# Patient Record
Sex: Female | Born: 1950 | Race: White | Hispanic: No | Marital: Married | Smoking: Former smoker
Health system: Southern US, Community
[De-identification: ages and names within clinical notes are randomized; demographics above are authoritative.]

## PROBLEM LIST (undated history)

## (undated) VITALS — BP 110/69 | HR 102 | Temp 97.0°F | Resp 18 | Ht 63.0 in | Wt 127.0 lb

## (undated) DIAGNOSIS — M47812 Spondylosis without myelopathy or radiculopathy, cervical region: Secondary | ICD-10-CM

## (undated) DIAGNOSIS — F32A Depression, unspecified: Secondary | ICD-10-CM

## (undated) DIAGNOSIS — F419 Anxiety disorder, unspecified: Secondary | ICD-10-CM

## (undated) DIAGNOSIS — G4730 Sleep apnea, unspecified: Secondary | ICD-10-CM

## (undated) DIAGNOSIS — I10 Essential (primary) hypertension: Secondary | ICD-10-CM

## (undated) DIAGNOSIS — F329 Major depressive disorder, single episode, unspecified: Secondary | ICD-10-CM

## (undated) DIAGNOSIS — D219 Benign neoplasm of connective and other soft tissue, unspecified: Secondary | ICD-10-CM

## (undated) DIAGNOSIS — K219 Gastro-esophageal reflux disease without esophagitis: Secondary | ICD-10-CM

## (undated) DIAGNOSIS — K8020 Calculus of gallbladder without cholecystitis without obstruction: Secondary | ICD-10-CM

## (undated) DIAGNOSIS — E7800 Pure hypercholesterolemia, unspecified: Secondary | ICD-10-CM

## (undated) DIAGNOSIS — F99 Mental disorder, not otherwise specified: Secondary | ICD-10-CM

## (undated) HISTORY — DX: Benign neoplasm of connective and other soft tissue, unspecified: D21.9

## (undated) HISTORY — DX: Sleep apnea, unspecified: G47.30

## (undated) HISTORY — PX: CHOLECYSTECTOMY: SHX55

## (undated) HISTORY — DX: Calculus of gallbladder without cholecystitis without obstruction: K80.20

## (undated) HISTORY — DX: Spondylosis without myelopathy or radiculopathy, cervical region: M47.812

## (undated) HISTORY — DX: Gastro-esophageal reflux disease without esophagitis: K21.9

## (undated) HISTORY — DX: Pure hypercholesterolemia, unspecified: E78.00

---

## 1999-01-06 ENCOUNTER — Other Ambulatory Visit: Admission: RE | Admit: 1999-01-06 | Discharge: 1999-01-06 | Payer: Self-pay | Admitting: Family Medicine

## 1999-02-12 ENCOUNTER — Other Ambulatory Visit: Admission: RE | Admit: 1999-02-12 | Discharge: 1999-02-20 | Payer: Self-pay

## 1999-03-10 ENCOUNTER — Inpatient Hospital Stay (HOSPITAL_COMMUNITY): Admission: AD | Admit: 1999-03-10 | Discharge: 1999-03-18 | Payer: Self-pay | Admitting: Psychiatry

## 2000-08-17 ENCOUNTER — Encounter: Payer: Self-pay | Admitting: Family Medicine

## 2000-08-17 ENCOUNTER — Encounter: Admission: RE | Admit: 2000-08-17 | Discharge: 2000-08-17 | Payer: Self-pay | Admitting: Family Medicine

## 2002-01-09 ENCOUNTER — Encounter: Admission: RE | Admit: 2002-01-09 | Discharge: 2002-01-09 | Payer: Self-pay | Admitting: Family Medicine

## 2002-01-09 ENCOUNTER — Encounter: Payer: Self-pay | Admitting: Family Medicine

## 2003-06-07 ENCOUNTER — Other Ambulatory Visit: Admission: RE | Admit: 2003-06-07 | Discharge: 2003-06-07 | Payer: Self-pay | Admitting: Family Medicine

## 2004-09-05 ENCOUNTER — Ambulatory Visit (HOSPITAL_COMMUNITY): Admission: RE | Admit: 2004-09-05 | Discharge: 2004-09-05 | Payer: Self-pay | Admitting: Obstetrics and Gynecology

## 2004-09-05 ENCOUNTER — Encounter (INDEPENDENT_AMBULATORY_CARE_PROVIDER_SITE_OTHER): Payer: Self-pay | Admitting: Specialist

## 2004-10-17 ENCOUNTER — Encounter: Admission: RE | Admit: 2004-10-17 | Discharge: 2004-10-17 | Payer: Self-pay | Admitting: Gastroenterology

## 2005-01-08 ENCOUNTER — Other Ambulatory Visit: Admission: RE | Admit: 2005-01-08 | Discharge: 2005-01-08 | Payer: Self-pay | Admitting: Obstetrics and Gynecology

## 2005-02-03 ENCOUNTER — Ambulatory Visit (HOSPITAL_COMMUNITY): Admission: RE | Admit: 2005-02-03 | Discharge: 2005-02-03 | Payer: Self-pay | Admitting: Obstetrics and Gynecology

## 2005-11-18 ENCOUNTER — Emergency Department (HOSPITAL_COMMUNITY): Admission: EM | Admit: 2005-11-18 | Discharge: 2005-11-18 | Payer: Self-pay | Admitting: Emergency Medicine

## 2006-02-24 ENCOUNTER — Other Ambulatory Visit: Admission: RE | Admit: 2006-02-24 | Discharge: 2006-02-24 | Payer: Self-pay | Admitting: Obstetrics and Gynecology

## 2007-04-28 ENCOUNTER — Other Ambulatory Visit: Admission: RE | Admit: 2007-04-28 | Discharge: 2007-04-28 | Payer: Self-pay | Admitting: Obstetrics and Gynecology

## 2008-03-01 ENCOUNTER — Encounter: Admission: RE | Admit: 2008-03-01 | Discharge: 2008-03-01 | Payer: Self-pay | Admitting: Obstetrics and Gynecology

## 2008-08-20 ENCOUNTER — Encounter: Admission: RE | Admit: 2008-08-20 | Discharge: 2008-08-20 | Payer: Self-pay | Admitting: Obstetrics and Gynecology

## 2008-11-12 ENCOUNTER — Encounter: Admission: RE | Admit: 2008-11-12 | Discharge: 2008-11-12 | Payer: Self-pay | Admitting: Obstetrics and Gynecology

## 2009-12-16 ENCOUNTER — Encounter: Admission: RE | Admit: 2009-12-16 | Discharge: 2009-12-16 | Payer: Self-pay | Admitting: Family Medicine

## 2009-12-25 ENCOUNTER — Other Ambulatory Visit: Admission: RE | Admit: 2009-12-25 | Discharge: 2009-12-25 | Payer: Self-pay | Admitting: Family Medicine

## 2010-06-08 ENCOUNTER — Encounter: Payer: Self-pay | Admitting: Obstetrics and Gynecology

## 2010-10-03 NOTE — H&P (Signed)
NAMEPAULITA, Kayla Deleon NO.:  [id number]   MEDICAL RECORD NO.:  [id number]           PATIENT TYPE:   LOCATION:                                 FACILITY:   PHYSICIAN:  Duke Salvia. Marcelle Overlie, M.D.    DATE OF BIRTH:   DATE OF ADMISSION:  DATE OF DISCHARGE:                                HISTORY & PHYSICAL   CHIEF COMPLAINT:  Menorrhagia with endometrial polyp.   HISTORY OF PRESENT ILLNESS:  A 60 year old G1, P1 perimenopausal patient who  was evaluated last November and December.  At that time her E2 level was 173  with an FSH of 7.1 and she was complaining of heavy periods.  SHG done  April 25, 2004, showed a well defined endometrial polyp, no other  abnormalities.  Adnexa negative.  Presents now for [facility] hysteroscopy.  This  procedure including risk of bleeding, infection, other complications that  may require additional surgery all reviewed with her which she understands  and accepts.   ALLERGIES:  No known drug allergies.   CURRENT MEDICATION:  Wellbutrin 200 mg p.o. daily.   PAST SURGICAL HISTORY:  1.  Cholecystectomy.  2.  Cesarean section x1.   FAMILY HISTORY:  Significant for a father with hypertension and angina.   PHYSICAL EXAMINATION:  VITAL SIGNS:  Temperature 98.2, blood pressure  142/80.  HEENT:  Unremarkable.  NECK:  Supple without masses.  LUNGS:  Clear.  CARDIOVASCULAR:  Regular rate and rhythm without murmurs, rubs, or gallops.  BREASTS:  Without masses.  ABDOMEN:  Soft, flat and nontender.  PELVIC:  Normal external genitalia, vagina and cervix clear.  Uterus mid  position, normal size, adnexa negative.  EXTREMITIES:  Unremarkable.  NEUROLOGIC:  Unremarkable.   IMPRESSION:  Menorrhagia, endometrial polyp.   PLAN:  D&C hysteroscopy.  procedure and risks reviewed as above.    RMH/MEDQ  D:  09/04/2004  T:  09/04/2004  Job:  [id number]

## 2010-10-03 NOTE — Op Note (Signed)
NAMEMODESTY, RUDY NO.:  [id number]   MEDICAL RECORD NO.:  [id number]          PATIENT TYPE:  AMB   LOCATION:  SDC                           FACILITY:  [facility]   PHYSICIAN:  Duke Salvia. Marcelle Overlie, M.D.DATE OF BIRTH:  January 29, 1951   DATE OF PROCEDURE:  09/05/2004  DATE OF DISCHARGE:                                 OPERATIVE REPORT   PREOPERATIVE DIAGNOSES:  1.  Endometrial polyp.  2.  Abnormal uterine bleeding.   POSTOPERATIVE DIAGNOSES:  1.  Endometrial polyp.  2.  Abnormal uterine bleeding.   PROCEDURE:  Dilatation and curettage, hysteroscopy, removal of polyp.   SURGEON:  Duke Salvia. Marcelle Overlie, M.D.   ANESTHESIA:  MAC plus paracervical block.   SPECIMENS REMOVED:  Endometrial curettings, endometrial polyp.   PROCEDURE AND FINDINGS:  The patient was taken to the operating room.  After  an adequate level of anesthesia was obtained with sedation, the patient's  legs were placed in stirrups and the perineal area was prepped and draped  with Betadine.  The bladder had been drained previously.  The uterus itself  was upper limit of normal size, mid- to posterior in position, adnexa  negative.  A speculum was positioned, cervix grasped with a tenaculum, a  paracervical block then created at 3 and 9 o'clock submucosally by  infiltrating 5-7 mL of 1% Xylocaine on either side after negative  aspiration.  The uterus was sounded to 9 cm in a mid- to posterior position,  progressively dilated to a 27 Pratt.  A D&C was then carried out and minimal  tissue was noted.  A polyp forceps was used to grasp the polyp.  This was  avulsed and a large endometrial polyp or what may be a submucous fibroid was  removed intact.  After this was completed, the scope was inserted.  The  cavity was irrigated and a good view was obtained, noting it to be clear.  She tolerated this well, minimal bleeding, went to the recovery room in good  condition.      RMH/MEDQ  D:  09/05/2004  T:   09/05/2004  Job:  [id number]

## 2011-06-30 ENCOUNTER — Other Ambulatory Visit: Payer: Self-pay | Admitting: Family Medicine

## 2011-06-30 DIAGNOSIS — Z1231 Encounter for screening mammogram for malignant neoplasm of breast: Secondary | ICD-10-CM

## 2011-07-15 ENCOUNTER — Ambulatory Visit
Admission: RE | Admit: 2011-07-15 | Discharge: 2011-07-15 | Disposition: A | Discharge status: Home / Self Care | Payer: 59 | Source: Ambulatory Visit | Attending: Family Medicine | Admitting: Family Medicine

## 2011-07-15 DIAGNOSIS — Z1231 Encounter for screening mammogram for malignant neoplasm of breast: Secondary | ICD-10-CM

## 2011-07-20 ENCOUNTER — Other Ambulatory Visit: Payer: Self-pay | Admitting: Family Medicine

## 2011-07-20 DIAGNOSIS — R928 Other abnormal and inconclusive findings on diagnostic imaging of breast: Secondary | ICD-10-CM

## 2011-07-22 ENCOUNTER — Ambulatory Visit
Admission: RE | Admit: 2011-07-22 | Discharge: 2011-07-22 | Disposition: A | Discharge status: Home / Self Care | Payer: 59 | Source: Ambulatory Visit | Attending: Family Medicine | Admitting: Family Medicine

## 2011-07-22 DIAGNOSIS — R928 Other abnormal and inconclusive findings on diagnostic imaging of breast: Secondary | ICD-10-CM

## 2011-08-21 ENCOUNTER — Encounter (HOSPITAL_COMMUNITY): Payer: Self-pay

## 2011-08-21 ENCOUNTER — Inpatient Hospital Stay (HOSPITAL_COMMUNITY)
Admission: RE | Admit: 2011-08-21 | Discharge: 2011-08-28 | DRG: 885 | Disposition: A | Discharge status: Home / Self Care | Payer: 59 | Attending: Psychiatry | Admitting: Psychiatry

## 2011-08-21 DIAGNOSIS — I10 Essential (primary) hypertension: Secondary | ICD-10-CM | POA: Diagnosis present

## 2011-08-21 DIAGNOSIS — F411 Generalized anxiety disorder: Secondary | ICD-10-CM | POA: Diagnosis present

## 2011-08-21 DIAGNOSIS — F332 Major depressive disorder, recurrent severe without psychotic features: Principal | ICD-10-CM | POA: Diagnosis present

## 2011-08-21 DIAGNOSIS — F339 Major depressive disorder, recurrent, unspecified: Secondary | ICD-10-CM | POA: Diagnosis present

## 2011-08-21 HISTORY — DX: Essential (primary) hypertension: I10

## 2011-08-21 HISTORY — DX: Depression, unspecified: F32.A

## 2011-08-21 HISTORY — DX: Major depressive disorder, single episode, unspecified: F32.9

## 2011-08-21 HISTORY — DX: Anxiety disorder, unspecified: F41.9

## 2011-08-21 HISTORY — DX: Mental disorder, not otherwise specified: F99

## 2011-08-21 LAB — RAPID URINE DRUG SCREEN, HOSP PERFORMED
Barbiturates: NOT DETECTED
Opiates: NOT DETECTED

## 2011-08-21 LAB — URINALYSIS, ROUTINE W REFLEX MICROSCOPIC
Glucose, UA: NEGATIVE mg/dL
Ketones, ur: NEGATIVE mg/dL
Protein, ur: NEGATIVE mg/dL
pH: 6.5 (ref 5.0–8.0)

## 2011-08-21 LAB — URINE MICROSCOPIC-ADD ON

## 2011-08-21 MED ORDER — LOSARTAN POTASSIUM 25 MG PO TABS
25.0000 mg | ORAL_TABLET | Freq: Every day | ORAL | Status: DC
  Administered 2011-08-22 – 2011-08-28 (×7): 25 mg via ORAL
  Filled 2011-08-21 (×10): qty 1

## 2011-08-21 MED ORDER — ESCITALOPRAM OXALATE 20 MG PO TABS
20.0000 mg | ORAL_TABLET | Freq: Every day | ORAL | Status: DC
  Administered 2011-08-22 – 2011-08-28 (×7): 20 mg via ORAL
  Filled 2011-08-21 (×11): qty 1

## 2011-08-21 MED ORDER — ALUM & MAG HYDROXIDE-SIMETH 200-200-20 MG/5ML PO SUSP: 30.0000 mL | ORAL | Status: DC | PRN

## 2011-08-21 MED ORDER — HYDROXYZINE HCL 25 MG PO TABS
25.0000 mg | ORAL_TABLET | Freq: Four times a day (QID) | ORAL | Status: DC | PRN
  Administered 2011-08-22 – 2011-08-23 (×4): 25 mg via ORAL

## 2011-08-21 MED ORDER — ARIPIPRAZOLE 2 MG PO TABS
2.0000 mg | ORAL_TABLET | Freq: Every day | ORAL | Status: DC
  Filled 2011-08-21 (×3): qty 1

## 2011-08-21 MED ORDER — ARIPIPRAZOLE 2 MG PO TABS
2.0000 mg | ORAL_TABLET | Freq: Once | ORAL | Status: DC
  Filled 2011-08-21: qty 1

## 2011-08-21 MED ORDER — IBUPROFEN 200 MG PO TABS
400.0000 mg | ORAL_TABLET | Freq: Four times a day (QID) | ORAL | Status: DC | PRN
  Administered 2011-08-21: 400 mg via ORAL
  Filled 2011-08-21: qty 1

## 2011-08-21 MED ORDER — TRAZODONE HCL 50 MG PO TABS
50.0000 mg | ORAL_TABLET | Freq: Every evening | ORAL | Status: DC | PRN
  Administered 2011-08-21 – 2011-08-27 (×7): 50 mg via ORAL
  Filled 2011-08-21 (×7): qty 1

## 2011-08-21 MED ORDER — ACETAMINOPHEN 325 MG PO TABS
650.0000 mg | ORAL_TABLET | Freq: Four times a day (QID) | ORAL | Status: DC | PRN
Start: 2011-08-21 — End: 2011-08-28

## 2011-08-21 MED ORDER — ARIPIPRAZOLE 2 MG PO TABS
2.0000 mg | ORAL_TABLET | Freq: Every day | ORAL | Status: DC
  Administered 2011-08-21: 2 mg via ORAL
  Filled 2011-08-21 (×4): qty 1

## 2011-08-21 MED ORDER — MAGNESIUM HYDROXIDE 400 MG/5ML PO SUSP: 30.0000 mL | Freq: Every day | ORAL | Status: DC | PRN

## 2011-08-21 NOTE — Progress Notes (Signed)
Patient ID: Kayla Deleon, female   DOB: Apr 05, 1951, 61 y.o.   MRN: [id number] Pt is awake and active on the unit this PM. Pt denies SI/HI and AVH. Pt states that she is concerned about medication management and is also wondering why she is on our 400 hallway. Writer explained to pt that our 500 hall is full for the time being. Pt acknowledges understanding and is cooperative and polite with staff. Pt c/o increased depression with medication changes and wants help to establish a new plan. Writer will continue to monitor.

## 2011-08-21 NOTE — BH Assessment (Signed)
Assessment Note   Kayla Deleon is an 61 y.o. female who presents today with her husband with c/o increased depression and anxiety. 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 Patient has become nonfunctional (pacing) Indecisive (normally decision maker in the family) and less commutative (per husband patient is usually very bubbly and talks a lot, "this is not my wife"). Patient very guarded about suicidal thoughts stating  "I hate to say it." when asked the question if she was thinking about hurting herself or having suicidal thoughts. She states that she feels that she has hit a brick wall, she is having racing thoughts about her debt and unable to focus on anything else. She admits to being unable to contract for safety and her husband does not feel safe taking her home.  Patient has history of 2 prior hospitalizations; 2000 w/ [facility] ( states that she had gotten to the point that she could not fx and 23 years ago she was hospitalized with SI and plan to overdose.  Axis I: Major Depression, Recurrent severe Axis II: Deferred Axis III:  Past Medical History  Diagnosis Date  . Hypertension    Axis IV: economic problems and occupational problems Axis V: 30  Past Medical History:  Past Medical History  Diagnosis Date  . Hypertension     No past surgical history on file.  Family History: No family history on file.  Social History:  does not have a smoking history on file. She does not have any smokeless tobacco history on file. She reports that she does not drink alcohol. Her drug history not on file.  Additional Social History:  Alcohol / Drug Use History of alcohol / drug use?: No history of alcohol / drug abuse Allergies:  Allergies  Allergen Reactions  . Shellfish Allergy Anaphylaxis    Pt. Uses Epipen.  .  Flagyl (Metronidazole Hcl)     Home Medications:  Medications Prior to Admission  Medication Dose Route Frequency Provider Last Rate Last Dose  . acetaminophen (TYLENOL) tablet 650 mg  650 mg Oral Q6H PRN Verne Spurr, PA-C      . alum & mag hydroxide-simeth (MAALOX/MYLANTA) 200-200-20 MG/5ML suspension 30 mL  30 mL Oral Q4H PRN Verne Spurr, PA-C      . ARIPiprazole (ABILIFY) tablet 2 mg  2 mg Oral Daily Verne Spurr, PA-C      . escitalopram (LEXAPRO) tablet 20 mg  20 mg Oral Daily Verne Spurr, PA-C      . losartan (COZAAR) tablet 25 mg  25 mg Oral Daily Verne Spurr, PA-C      . magnesium hydroxide (MILK OF MAGNESIA) suspension 30 mL  30 mL Oral Daily PRN Verne Spurr, PA-C      . traZODone (DESYREL) tablet 50 mg  50 mg Oral QHS PRN Verne Spurr, PA-C       No current outpatient prescriptions on file as of 08/21/2011.    OB/GYN Status:  No LMP recorded.  General Assessment Data Location of Assessment: [facility] Living Arrangements: Spouse/significant other;Children (26yo Daughter in college) Can pt return to current living arrangement?: Yes Admission Status: Voluntary Is patient capable of signing voluntary admission?: Yes Transfer from: Home Referral Source: Self/Family/Friend  Education Status Is patient currently in school?: No Current Grade:  Geneticist, molecular graduate) Highest grade of  school patient has completed:  (4 year degree)  Risk to self Suicidal Ideation: Yes-Currently Present Suicidal Intent: Yes-Currently Present Is patient at risk for suicide?: Yes Suicidal Plan?: Yes-Currently Present Specify Current Suicidal Plan:  (Take pills) Access to Means: Yes Specify Access to Suicidal Means:  (Medications) What has been your use of drugs/alcohol within the last 12 months?:  (Na) Previous Attempts/Gestures: Yes How many times?:  (h/o 2 hospitalizations) Other Self Harm Risks:  (None) Triggers for Past Attempts:  (Finacial issues, increased  stress) Intentional Self Injurious Behavior: None Family Suicide History: No Recent stressful life event(s): Financial Problems (Job stress) Persecutory voices/beliefs?: No Depression: Yes Depression Symptoms: Despondent;Tearfulness;Isolating;Loss of interest in usual pleasures;Feeling worthless/self pity Substance abuse history and/or treatment for substance abuse?: No Suicide prevention information given to non-admitted patients: Not applicable  Risk to Others Homicidal Ideation: No Thoughts of Harm to Others: No Current Homicidal Intent: No Current Homicidal Plan: No Access to Homicidal Means: No Identified Victim:  (Na) History of harm to others?: No Assessment of Violence: None Noted Violent Behavior Description:  (Na) Does patient have access to weapons?: Yes (Comment) (Husband has a gun) Criminal Charges Pending?: No Does patient have a court date: No  Psychosis Hallucinations: None noted Delusions: None noted  Mental Status Report Appear/Hygiene:  (WNL) Eye Contact: Good Motor Activity:  (WNL) Speech: Logical/coherent Level of Consciousness: Alert Mood: Depressed;Anxious;Apprehensive Affect:  (Flat) Anxiety Level: Moderate Thought Processes: Coherent;Relevant Judgement: Impaired Orientation: Person;Place;Time;Situation Obsessive Compulsive Thoughts/Behaviors: None  Cognitive Functioning Concentration: Decreased Memory: Recent Intact;Remote Impaired IQ: Average Insight: Fair Impulse Control: Fair Appetite: Poor Weight Loss:  (5-6 lbs / 6 weeks) Weight Gain:  (No) Sleep:  (Broken) Total Hours of Sleep:  (6 hrs) Vegetative Symptoms: None  Prior Inpatient Therapy Prior Inpatient Therapy: Yes Prior Therapy Dates:  (02/1999) Prior Therapy Facilty/Provider(s):  (02/1999 [facility]) Reason for Treatment:  (MDD)  Prior Outpatient Therapy Prior Outpatient Therapy: Yes          Abuse/Neglect Assessment (Assessment to be complete while patient is  alone) Physical Abuse: Denies Verbal Abuse: Denies Sexual Abuse: Denies Exploitation of patient/patient's resources: Denies Self-Neglect: Denies          Additional Information 1:1 In Past 12 Months?: No CIRT Risk: No Elopement Risk: No Does patient have medical clearance?: No  Child/Adolescent Assessment Running Away Risk:  (Na) Bed-Wetting:  (Na) Destruction of Property:  (Na) Cruelty to Animals:  (Na) Stealing:  (Na) Rebellious/Defies Authority:  (Na) Satanic Involvement:  (Na) Fire Setting:  (Na) Problems at School:  (Na) Gang Involvement:  (Na)  Disposition:  Disposition Disposition of Patient: Inpatient treatment program Type of inpatient treatment program: Adult (accepted by Verne Spurr PA)  On Site Evaluation by:   Reviewed with Physician:     Lavonia Dana 08/21/2011 1:51 PM

## 2011-08-21 NOTE — H&P (Signed)
Psychiatric Admission Assessment Adult  Patient Identification:  Kayla Deleon Date of Evaluation:  08/29/2011 Chief Complaint:  MDD History of Present Illness:Kayla Deleon is a 61 yo MWF who presents to [facility] as a walk-in for evaluation.  Her husband brought her in today because of her worsening depression. She has worsened over the last 3 months as demonstrated by her increasing isolation, poor communication, poor sleep, withdrawal from her friends and family, her inability to make a decision on even mundane issues, loss of interest in usual activities, increased anxiety over her work. Today she has been pacing back and forth and is unable to complete her usual routine to get ready for work.  Kayla Deleon states she may have taken more sleeping medication than she should because she could not sleep.  She can not contract for safety and her husband brought her in stating "this is not my wife."  Kayla Deleon admitted that she has been thinking of taking an overdose to "stop the pain."  She has had 2 previous admissions.  Hx of Trauma: (Emotional/Phsycial/Sexual) denies Past Psychiatric History: 2 previous admissions, one 23 yrs ago, 1 in 2000 at [facility] Past Medical History:   Past Medical History  Diagnosis Date  . Hypertension   . Mental disorder   . Depression   . Anxiety    Allergies:   Allergies  Allergen Reactions  . Shellfish Allergy Anaphylaxis    Pt. Uses Epipen.  . Flagyl (Metronidazole Hcl) Other (See Comments)    unknown    PTA Medications: No prescriptions prior to admission    Previous Psychotropic Medications:  Lexapro 2 months ago, also stopped Abilify "it wasn't working." Has also stopped Wellbutrin, "it wasn't working either."  Substance Abuse History in the last 12 months: denies  Social History: Current Place of Residence:   Scientist, research (physical sciences) of Birth:   Employment: Marital Status:  Married Children: Education:  Financial planner History:   Legal History: Family History:  No family history on  file.  ROS: As noted in the HPI. BP 110/69  Pulse 102  Temp(Src) 97 F (36.1 C) (Oral)  Resp 18  Ht 5\' 3"  (1.6 m)  Wt 57.607 kg (127 lb)  BMI 22.50 kg/m2  General Appearance:    Alert, cooperative, no distress, appears stated age  Head:    Normocephalic, without obvious abnormality, atraumatic  Eyes:    PERRL, conjunctiva/corneas clear, EOM's intact, fundi    benign, both eyes  Ears:    Normal TM's and external ear canals, both ears  Nose:   Nares normal, septum midline, mucosa normal, no drainage    or sinus tenderness  Throat:   Lips, mucosa, and tongue normal; teeth and gums normal  Neck:   Supple, symmetrical, trachea midline, no adenopathy;    thyroid:  no enlargement/tenderness/nodules; no carotid   bruit or JVD  Back:     Symmetric, no curvature, ROM normal, no CVA tenderness  Lungs:     Clear to auscultation bilaterally, respirations unlabored  Chest Wall:    No tenderness or deformity   Heart:    Regular rate and rhythm, S1 and S2 normal, no murmur, rub   or gallop  Breast Exam:    Deferred  Abdomen:     Soft, non-tender, bowel sounds active all four quadrants,    no masses, no organomegaly  Genitalia:    Deferred  Rectal:    Deferred  Extremities:   Extremities normal, atraumatic, no cyanosis or edema  Pulses:  2+ and symmetric all extremities  Skin:   Skin color, texture, turgor normal, no rashes or lesions  Lymph nodes:   Cervical, supraclavicular, and axillary nodes normal  Neurologic:   CNII-XII intact, normal strength, sensation and reflexes    throughout  Mental Status Examination/Evaluation: Appearance: WDWN calm  Eye Contact::  Poor to fair  Speech:  Poverty of speech, soft, monotone  Volume:  low  Mood:  Depressed   Affect:  Flat congruent with depression  Thought Process:  Linear goal directed, guarded  Orientation:  x3  Thought Content:  Lucid, no AH/VH.  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 Hypertension   . Mental disorder   . Depression   . Anxiety   AXIS IV:  Problems related to work and finances AXIS V:  GAF: 45 previous year is unknown  Treatment Plan/Recommendations: Admit for crisis stabilization and supportive care.   Evaluation and treatment for medical problems associated with current state of health.  Treatment Plan Summary: Medication management, daily contact with the patient, supportive care. Current Medications:  Current Facility-Administered Medications  Medication Dose Route Frequency Provider Last Rate Last Dose  . DISCONTD: acetaminophen (TYLENOL) tablet 650 mg  650 mg Oral Q6H PRN Verne Spurr, PA-C      . DISCONTD: alum & mag hydroxide-simeth (MAALOX/MYLANTA) 200-200-20 MG/5ML suspension 30 mL  30 mL Oral Q4H PRN Verne Spurr, PA-C      . DISCONTD: buPROPion (WELLBUTRIN XL) 24 hr tablet 150 mg  150 mg Oral QPC breakfast Curlene Labrum Readling, MD   150 mg at 08/28/11 0820  . DISCONTD: clonazePAM (KLONOPIN) tablet 0.25 mg  0.25 mg Oral [id number] Curlene Labrum Readling, MD   0.25 mg at 08/28/11 1517  . DISCONTD: escitalopram (LEXAPRO) tablet 20 mg  20 mg Oral Daily Verne Spurr, PA-C   20 mg at 08/28/11 0820  . DISCONTD: hydrOXYzine (ATARAX/VISTARIL) tablet 25 mg  25 mg Oral Q6H PRN Ronny Bacon, MD   25 mg at 08/23/11 2214  . DISCONTD: ibuprofen (ADVIL,MOTRIN) tablet 400 mg  400 mg Oral Q6H PRN Curlene Labrum Readling, MD   400 mg at 08/21/11 1620  . DISCONTD: losartan (COZAAR) tablet 25 mg  25 mg Oral Daily Verne Spurr,  PA-C   25 mg at 08/28/11 0820  . DISCONTD: magnesium hydroxide (MILK OF MAGNESIA) suspension 30 mL  30 mL Oral Daily PRN Verne Spurr, PA-C      . DISCONTD: traZODone (DESYREL) tablet 50 mg  50 mg Oral QHS PRN Verne Spurr, PA-C   50 mg at 08/27/11 2152   Current Outpatient Prescriptions  Medication Sig Dispense Refill  . buPROPion (WELLBUTRIN XL) 150 MG 24 hr tablet Take 1 tablet (150 mg total) by mouth daily after breakfast. For depression  30 tablet  0  . clonazePAM (KLONOPIN) 0.5 MG tablet Take 0.5 tablets (0.25 mg total) by mouth  3 (three) times daily at 8am, 2pm and bedtime. For anxiety.  45 tablet  0  . escitalopram (LEXAPRO) 20 MG tablet Take 1 tablet (20 mg total) by mouth daily. For depression and anxiety.  30 tablet  0  . ibuprofen (ADVIL,MOTRIN) 200 MG tablet Take 400 mg by mouth every 6 (six) hours as needed. For headache      . losartan (COZAAR) 25 MG tablet Take 1 tablet (25 mg total) by mouth daily. For blood pressure      . neomycin-bacitracin-polymyxin (NEOSPORIN) OINT Apply 1 application topically daily as needed. For chaffing behind both ears      . traZODone (DESYREL) 50 MG tablet Take one tablet (50mg ) daily at bedtime for sleep.  30 tablet  0    Observation Level/Precautions:  routine  Laboratory:       Routine PRN Medications:  Yes.  Consultations:    Discharge Concerns:    Other:      Lloyd Huger T. Dywane Peruski PAC For Dr. [name]  08/22/2011

## 2011-08-21 NOTE — Progress Notes (Signed)
Patient ID: Kayla Deleon, female   DOB: [birth year], 61 y.o.   MRN: [id number] 61 year old female patient admitted voluntarily as a walk-in patient. Patient has stated that she has been having increased depression, anxiety and experiencing fear. Pt feels that recent change in medications may have contributed to her hospitalilzation. Pt has stated that she was weaning herself off wellbutrin and that she went to [facility] and they prescribed her an increase in lexapro and started on abilify, and has only been on abilify the past couple weeks. Pt has stated that she has been hospitalized in the past but states that it has been about 10 years since her last hospitalization. Pt is able to contract for safety on the unit.

## 2011-08-22 DIAGNOSIS — F339 Major depressive disorder, recurrent, unspecified: Secondary | ICD-10-CM

## 2011-08-22 LAB — COMPREHENSIVE METABOLIC PANEL
ALT: 15 U/L (ref 0–35)
AST: 13 U/L (ref 0–37)
Alkaline Phosphatase: 53 U/L (ref 39–117)
CO2: 28 mEq/L (ref 19–32)
Calcium: 9.1 mg/dL (ref 8.4–10.5)
Chloride: 98 mEq/L (ref 96–112)
GFR calc Af Amer: 72 mL/min — ABNORMAL LOW (ref >90)
GFR calc non Af Amer: 62 mL/min — ABNORMAL LOW (ref >90)
Glucose, Bld: 103 mg/dL — ABNORMAL HIGH (ref 70–99)
Potassium: 3.5 mEq/L (ref 3.5–5.1)
Sodium: 136 mEq/L (ref 135–145)
Total Bilirubin: 0.3 mg/dL (ref 0.3–1.2)

## 2011-08-22 LAB — DIFFERENTIAL
Basophils Absolute: 0.1 10*3/uL (ref 0.0–0.1)
Eosinophils Relative: 2 % (ref 0–5)
Lymphocytes Relative: 28 % (ref 12–46)
Monocytes Absolute: 0.9 10*3/uL (ref 0.1–1.0)
Monocytes Relative: 11 % (ref 3–12)

## 2011-08-22 LAB — CBC
HCT: 42.6 % (ref 36.0–46.0)
Hemoglobin: 14.1 g/dL (ref 12.0–15.0)
MCV: 93 fL (ref 78.0–100.0)
RDW: 13 % (ref 11.5–15.5)
WBC: 8.3 10*3/uL (ref 4.0–10.5)

## 2011-08-22 MED ORDER — BUPROPION HCL ER (XL) 150 MG PO TB24
150.0000 mg | ORAL_TABLET | Freq: Every day | ORAL | Status: DC
  Administered 2011-08-23 – 2011-08-28 (×6): 150 mg via ORAL
  Filled 2011-08-22 (×10): qty 1

## 2011-08-22 NOTE — Progress Notes (Signed)
[facility] Adult Inpatient Family/Significant Other Suicide Prevention Education  Suicide Prevention Education:  Education Completed; Ree Kida Bielak-(Spouse)-[phone]- has been identified by the patient as the family member/significant other with whom the patient will be residing, and identified as the person(s) who will aid the patient in the event of a mental health crisis (suicidal ideations/suicide attempt).  With written consent from the patient, the family member/significant other has been provided the following suicide prevention education, prior to the and/or following the discharge of the patient.  The suicide prevention education provided includes the following:  Suicide risk factors  Suicide prevention and interventions  National Suicide Hotline telephone number  [facility] assessment telephone number  [facility] Emergency Assistance 911  [facility] and/or Residential Mobile Crisis Unit telephone number  Request made of family/significant other to:  Remove weapons (e.g., guns, rifles, knives), all items previously/currently identified as safety concern. Pt.'s husband states there are guns in the home but that he will secure them.   Remove drugs/medications (over-the-counter, prescriptions, illicit drugs), all items previously/currently identified as a safety concern.Pt.'s husband will secure medication, lock them up and help the pt. With monitoring her medications.  Pt.'s husband had no concerns or questions at this time but stated he will talk to pt. When he insists today as to what the doctor's said and stated he wants her to have the right medication so that she can get back to where she was in terms of mental health stability.  The family member/significant other verbalizes understanding of the suicide prevention education information provided.  The family member/significant other agrees to remove the items of safety concern listed above.  [name] 08/22/2011, 4:53 PM

## 2011-08-22 NOTE — Progress Notes (Signed)
[facility] Group Notes:  (Counselor/Nursing/MHT/Case Management/Adjunct)  08/22/2011 3:57 PM  Type of Therapy:  Discharge Planning  Participation Level:  Active  Participation Quality:  Appropriate and Attentive  Affect:  Appropriate  Cognitive:  Appropriate  Insight:  Good  Engagement in Group:  Good  Engagement in Therapy:  Good  Modes of Intervention:  Clarification, Problem-solving, Socialization and Support  Summary of Progress/Problems: Pt. participated in after care planning group and was given [place] SI pamphlet and crisis and hotline numbers. Pt. agreed to use them if needed. The pt. stated she came to the hospital yesterday and that she felt she is doing better since being here. Pt. Had no questions about after  care planning.    [name] 08/22/2011, 3:57 PM

## 2011-08-22 NOTE — Progress Notes (Signed)
Pt is depressed and sad  She attends and participates in groups  She is cooperative and compliant with treatment   She is not presently suicidal or homicidal   She is very anxious   Verbal support given  Medications administered and effectiveness monitored  Q 15 min checks   Pt safe at present

## 2011-08-22 NOTE — [facility] Suicide Risk Assessment (Signed)
Suicide Risk Assessment  Admission Assessment     Demographic factors:  Assessment Details Time of Assessment: Admission Information Obtained From: Patient Current Mental Status:    Loss Factors:    Historical Factors:  Historical Factors: Family history of mental illness or substance abuse Risk Reduction Factors:  Risk Reduction Factors: Sense of responsibility to family;Living with another person, especially a relative;Positive social support;Positive therapeutic relationship;Positive coping skills or problem solving skills  Kayla Deleon was seen and interviewed this morning. She was admitted secondary to worsening depression in the context of stress at work and recent medication changes. She weaned herself off Wellbutrin last year and became more depressed. She attempted to restart the medication but felt sick after 4 days and discontinued it again. She reports that on 3/21 her Lexapro was increased to 20 mg and Abilify 2 mg was added for augmentation. She is unhappy with the Abilify and is interested in restart Wellbutrin, though at a lower dose than she took before to see if she can tolerate it. She currently denies adverse medication effects. She reports not sleeping well last night. Appetite and energy level are fair. Kayla Deleon had thoughts of overdosing on pills yesterday morning but has no suicidal thoughts or intent today. She states she feels safe in the hospital. She also has no thoughts of harming others.  CLINICAL FACTORS:   Depression:   Anhedonia Insomnia  COGNITIVE FEATURES THAT CONTRIBUTE TO RISK:  Thought constriction (tunnel vision)    SUICIDE RISK:   Mild:  Suicidal ideation of limited frequency, intensity, duration, and specificity.  There are no identifiable plans, no associated intent, mild dysphoria and related symptoms, good self-control (both objective and subjective assessment), few other risk factors, and identifiable protective factors, including available and accessible  social support.  PLAN OF CARE: 1. Continue q15 observation 2. Continue Lexapro for depression. Plan to discontinue Abilify and restart Wellbutrin XL 150 mg qam. 3. Encourage participation in groups 4. Reviewed labwork  Kayla Deleon 08/22/2011, 5:09 PM

## 2011-08-22 NOTE — [facility] Counselor (Signed)
Adult Comprehensive Assessment  Patient ID: Kayla Deleon, female   DOB: 09-Jan-1951, 61 y.o.   MRN: [id number]  Information Source: Information source: Patient  Current Stressors:  Educational / Learning stressors: Pt. does not report any problems Employment / Job issues: Pt. is employed fulltime and reports no problems Family Relationships: Pt. does not report any problems Financial / Lack of resources (include bankruptcy): Pt. reports credit card debit Housing / Lack of housing: Pt. has no problems Physical health (include injuries & life threatening diseases): Pt. does not report any problems Social relationships: Pt. does not have any problems Substance abuse: Pt. deies any use Bereavement / Loss: Pt. lost mother 15 years ago. father passed away when the pt. was in her 26's. Pt.'s brother passed away in cher childhood  Living/Environment/Situation:  Living Arrangements: Spouse/significant other Living conditions (as described by patient or guardian): Pt. reports good living situations How long has patient lived in current situation?: 19 years What is atmosphere in current home: Supportive  Family History:  Marital status: Married Number of Years Married: 30  What types of issues is patient dealing with in the relationship?: Pt. reports no problems in relationship Additional relationship information: Pt. does not report any problems Does patient have children?: Yes How many children?: 1  (daughter who is 56 and lives with the pt.) How is patient's relationship with their children?: Pt. is close to her daughter  Childhood History:  By whom was/is the patient raised?: Both parents Additional childhood history information: Pt. reports mother had depression problems and took medication Description of patient's relationship with caregiver when they were a child: Pt. was close to parents Patient's description of current relationship with people who raised him/her: Both parents are  deceased Does patient have siblings?: Yes Number of Siblings: 1  (1 younger brother) Description of patient's current relationship with siblings: Pt. is close to her brother Did patient suffer any verbal/emotional/physical/sexual abuse as a child?: No Did patient suffer from severe childhood neglect?: No Has patient ever been sexually abused/assaulted/raped as an adolescent or adult?: No Was the patient ever a victim of a crime or a disaster?: No Witnessed domestic violence?: No Has patient been effected by domestic violence as an adult?: No  Education:  Highest grade of school patient has completed: High school diploma-B.S. deree in Mining engineer Currently a student?: No Learning disability?: No  Employment/Work Situation:   Employment situation: Employed Where is patient currently employed?: Scientist, research (medical) How long has patient been employed?: 16 years Patient's job has been impacted by current illness: Yes (pt. reports problems with illiness and job) Describe how patient's job has been implacted: Pt. reports job has been effected ny her current illiness What is the longest time patient has a held a job?: 16 years Where was the patient employed at that time?: [employer] the pt. is presently employed Has patient ever been in the [employer]?: No Has patient ever served in combat?: No  Financial Resources:   Financial resources: Income from [employer] insurance Does patient have a representative payee or guardian?: No  Alcohol/Substance Abuse:   What has been your use of drugs/alcohol within the last 12 months?: Pt. denies use If attempted suicide, did drugs/alcohol play a role in this?: No Alcohol/Substance Abuse Treatment Hx: Denies past history If yes, describe treatment: Pt. denies use Has alcohol/substance abuse ever caused legal problems?: No  Social Support System:   Patient's Community Support System: Good Describe Community Support System: Pt. reports good  support Type  of faith/religion: Methodist How does patient's faith help to cope with current illness?: Pt. attends church  Leisure/Recreation:   Leisure and Hobbies: Pt. likes going to movies  Strengths/Needs:   What things does the patient do well?: Make good pound cake In what areas does patient struggle / problems for patient: Depression/fear and anxiety  Discharge Plan:   Does patient have access to transportation?: Yes (pt.'s husband will pick up) Will patient be returning to same living situation after discharge?: Yes Currently receiving community mental health services: Yes (From Whom) Starling Manns, PA at [facility]) If no, would patient like referral for services when discharged?: Yes (What county?) ([place]) Does patient have financial barriers related to discharge medications?: No  Summary/Recommendations:   Summary and Recommendations (to be completed by the evaluator): Pt. is a 61 year old female admitted for depression, axiety and pear. Pt. reports that she is being seen by [facility] by PA Starling Manns. Pt. reports problems with her medication and would like a referral for a therapist and psycharist and  lives in [place]. Pt. recommendations include: crisis stablization, case management, group therphy, and medication management.   F[place]. 08/22/2011

## 2011-08-22 NOTE — Progress Notes (Signed)
[facility] Group Notes:  (Counselor/Nursing/MHT/Case Management/Adjunct)  08/22/2011 4:01 PM  Type of Therapy:  Group Therapy  Participation Level:  Active  Participation Quality:  Appropriate and Attentive  Affect:  Appropriate  Cognitive:  Appropriate  Insight:  Good  Engagement in Group:  Good  Engagement in Therapy:  Good  Modes of Intervention:  Clarification, Limit-setting, Socialization and Support  Summary of Progress/Problems: Pt. participated in group discussion on self sabotaging behaviors . Pt. Discussed about not living in the past and being aware of options and resources that help with dealing with mental illness.   Kayla Deleon [place] 08/22/2011, 4:01 PM

## 2011-08-23 NOTE — Progress Notes (Signed)
Patient ID: Kayla Deleon, female   DOB: 04-12-1951, 61 y.o.   MRN: [id number]   Pt has been very flat and depressed on the unit. Pt was able to attend groups and was able to participate. Pt reported that she was having a better day, no other information was relayed. Pt reported being negative SI/HI, no AH/VH noted. Pt took all medication this morning without any problems, no other issues or concerns noted.

## 2011-08-23 NOTE — Progress Notes (Signed)
Patient observed writer sitting in the dayroom watching tv but no interaction with peers. Writer introduced self to patient as her nurse and informed her of prn medications available if needed. Patient c/o of not sleeping well last night. Writer suggested patient try visteril and take her trazadone before lying down and if needing something else for anxiety visteril would be available again at 2 am. Patient was agreeable to trying this. Currently denies having pain, -si/hi/a/v hall. Safety maintained on unit, will continue to monitor.

## 2011-08-23 NOTE — Progress Notes (Signed)
Pt attended night group. Pt pleasant and cooperative. Pt interacts well with peers and staff. Pt was offered support and encouragement. Pt safety maintained on unit.

## 2011-08-23 NOTE — Progress Notes (Signed)
[facility] Group Notes:  (Counselor/Nursing/MHT/Case Management/Adjunct)  08/23/2011 10:30 AM  Type of Therapy:  Group Therapy, Dance/Movement Therapy   Participation Level:  Did Not Attend  Kayla Deleon   

## 2011-08-23 NOTE — Progress Notes (Signed)
  Kayla Deleon is a 61 y.o. female [id number] 1951-02-09  08/21/2011 Principal Problem:  *Major depressive disorder, recurrent   Mental Status: Mood feels slightly less anxious internally. Denies SI/HI/AVH.     Subjective/Objective: Groups escalated her anxiety- found them intense . Needs help contacting her place of employment.Was here 13 years ago and wonders if IOP might not be the right level of support.     Filed Vitals:   08/23/11 0712  BP: 114/73  Pulse: 118  Temp:   Resp:     Lab Results:   BMET    Component Value Date/Time   NA 136 08/22/2011 1951   K 3.5 08/22/2011 1951   CL 98 08/22/2011 1951   CO2 28 08/22/2011 1951   GLUCOSE 103* 08/22/2011 1951   BUN 12 08/22/2011 1951   CREATININE 0.98 08/22/2011 1951   CALCIUM 9.1 08/22/2011 1951   GFRNONAA 62* 08/22/2011 1951   GFRAA 72* 08/22/2011 1951    Medications:  Scheduled:     . buPROPion  150 mg Oral QPC breakfast  . escitalopram  20 mg Oral Daily  . losartan  25 mg Oral Daily  . DISCONTD: ARIPiprazole  2 mg Oral Daily     PRN Meds acetaminophen, alum & mag hydroxide-simeth, hydrOXYzine, ibuprofen, magnesium hydroxide, traZODone Plan: case manager will help her contact her place of employment and find the right level of care until she can return full time.   Reyhan Moronta,MICKIE D. 08/23/2011

## 2011-08-24 MED ORDER — CIPROFLOXACIN HCL 250 MG PO TABS
250.0000 mg | ORAL_TABLET | Freq: Two times a day (BID) | ORAL | Status: AC
  Administered 2011-08-24 – 2011-08-26 (×6): 250 mg via ORAL
  Filled 2011-08-24 (×6): qty 1

## 2011-08-24 NOTE — Progress Notes (Signed)
Patient ID: Kayla Deleon, female   DOB: 1950-07-27, 61 y.o.   MRN: [id number]  First [facility] admission for [name], who works full time, and had stopped her Wellbutrin last August thinking she didn't need it anymore, then found herself slipping into a deep depression with psychomotor slowing. She started back on the Wellbutrin again on her own but apparently a higher dose in found herself feeling worse instead of better. She was started back on the Wellbutrin after being admitted here.  Presents fully alert, pleasant, polite. Reports last night was the first full night of sleep that she has gotten. Says that she is doing well now that she has started back on a low dose of Wellbutrin, feels her depression is improved. Rates her depression today at 5/10 on a one to ten scale, if 10 is the worst symptoms. Rates anxiety of 4-5/10. Sleep is "great". Denies hopelessness denies suicidal thoughts. Concentration is improving significantly, and this was a significant problem for her at work. Previously she had been unable to concentrate or think sequentially and was significantly impairing her ability to to her full-time job. She works in the Social worker at [employer].  Objective: Fully alert female, good eye contact, grooming is appropriate, affect appropriate. Affect is full. Speech is nonpressured, normal in form pacing production. No evidence of response latency. Good eye contact, attentive, speech is smooth. She recognizes that she had difficulty expressing herself during the worst part of her depression and feels that this is much improved. Her insight is good, impulse control and judgment normal. No evidence of dangerous ideas thinking is nonpsychotic.  Plan: Continue current plan. We'll hear feedback from her husband.

## 2011-08-24 NOTE — Progress Notes (Addendum)
[facility] Group Notes: (Counselor/Nursing/MHT/Case Management/Adjunct) 08/24/2011   @  11:00am Overcoming Obstacles to Wellness   Type of Therapy:  Group Therapy  Participation Level:  Did Not Attend    Billie Lade 08/24/2011 2:38 PM    [facility] Group Notes: (Counselor/Nursing/MHT/Case Management/Adjunct) 08/24/2011   @1 :15pm Music and Mood  Type of Therapy:  Group Therapy  Participation Level:  None  Participation Quality:  Attentive   Affect:  Blunted  Cognitive:  Appropriate  Insight:  None  Engagement in Group: None  Engagement in Therapy:  None  Modes of Intervention:  Support and Exploration  Summary of Progress/Problems: Marilee was not engaged in group and did not share her thoughts or feelings.   Billie Lade 08/24/2011 3:46 PM

## 2011-08-24 NOTE — Progress Notes (Signed)
Patient ID: Kayla Deleon, female   DOB: 09/24/1950, 61 y.o.   MRN: [id number] Pt is awake and active on the unit this PM. Pt is attending groups and is cooperative with staff. Pt denies SI/HI and AVH. Pt stated that she was concerned about confidentiality regarding her employer but staff assured her that paperwork would be kept private. Pt mood is still depressed but she is interacting well in the milieu. Writer will continue to monitor.

## 2011-08-24 NOTE — Progress Notes (Signed)
Patient ID: Kayla Deleon, female   DOB: [birth year], 61 y.o.   MRN: [id number] Pt has been attending groups and interacting with peers.  She was started on an antibiotic for a urinary tract infection. Pt denies having any symptoms.  She says she is reluctant to talk with her casemanager in group and requested to talk to her 1:1.  Casemgr informed.  Pt is quiet and seems guarded.

## 2011-08-24 NOTE — Progress Notes (Signed)
Pt attended discharge planning group and actively participated.  Pt presents with calm mood and affect.  Pt was guarded when sharing reason for entering the hospital.  Pt states that she has been depressed and worrying, which has worsened over the past 4 weeks.  Pt is worried about losing her job and SW assured pt that SW would send a letter to her work.  Pt signed consent for SW to do so.  Pt states that she lives in [place] with her daughter and husband.  Pt has transportation.  Pt states that she doesn't have a psychiatrist or therapist.  SW will seek appropriate follow up.  Pt ranks depression at a 5 and anxiety at a 4 today.  Pt denies SI.  No further needs voiced by pt at this time.    Reyes Ivan, [place] 08/24/2011  2:31 PM

## 2011-08-24 NOTE — Progress Notes (Signed)
D:  Pt attended wrap up group tonight.  Pt expressed looking forward to different sleep environment to help her rest.  A:  Encourage pt to attend groups and support peers.  R:  Pt is safe. Aundria Rud, Ruchy Wildrick L, MHT/NS

## 2011-08-25 DIAGNOSIS — F411 Generalized anxiety disorder: Secondary | ICD-10-CM | POA: Diagnosis present

## 2011-08-25 LAB — COMPREHENSIVE METABOLIC PANEL
AST: 15 U/L (ref 0–37)
BUN: 11 mg/dL (ref 6–23)
CO2: 27 mEq/L (ref 19–32)
Chloride: 100 mEq/L (ref 96–112)
Creatinine, Ser: 0.9 mg/dL (ref 0.50–1.10)
GFR calc Af Amer: 80 mL/min — ABNORMAL LOW (ref >90)
GFR calc non Af Amer: 69 mL/min — ABNORMAL LOW (ref >90)
Glucose, Bld: 119 mg/dL — ABNORMAL HIGH (ref 70–99)
Total Bilirubin: 0.3 mg/dL (ref 0.3–1.2)

## 2011-08-25 MED ORDER — CLONAZEPAM 0.5 MG PO TABS
0.2500 mg | ORAL_TABLET | ORAL | Status: DC
  Administered 2011-08-25 (×2): 0.25 mg via ORAL
  Administered 2011-08-26: 22:00:00 via ORAL
  Administered 2011-08-26: 0.25 mg via ORAL
  Administered 2011-08-26: 09:00:00 via ORAL
  Administered 2011-08-27 – 2011-08-28 (×4): 0.25 mg via ORAL
  Filled 2011-08-25 (×10): qty 1

## 2011-08-25 NOTE — Progress Notes (Signed)
Patient ID: Kayla Deleon, female   DOB: 08-20-1950, 61 y.o.   MRN: [id number] Pt. Eyes closed, no distress noted, resp. Even. Staff will monitor q39min for safety.

## 2011-08-25 NOTE — Progress Notes (Signed)
[facility] MD Progress Note  08/25/2011 2:12 PM  Diagnosis:  Axis I: Major Depressive Disorder - Recurrent. Generalized Anxiety Disorder.  The patient was seen today and reports the following:   Sleep: The patient reports to sleeping well at night.  She did state that she had difficulty initiating sleep last night due to being worried she may disturb her roommate.  Appetite: The patient reports a good appetite.   Mild>(1-10) >Severe  Hopelessness (1-10): 0  Depression (1-10): 5-7  Anxiety (1-10): 5-7   Suicidal Ideation: The patient adamantly denies any suicidal ideations today.  Plan: No  Intent: No  Means: No   Homicidal Ideation: The patient adamantly denies any homicidal ideations today.  Plan: No  Intent: No.  Means: No   Eye Contact: Good.  General Appearance/Behavior: The patient was friendly and cooperative during the interview today.  Motor Behavior: wnl.  Speech: Appropriate in rate and volume with no pressuring of speech today.  Mental Status: Alert and Oriented x 3  Level of Consciousness: Alert  Mood: Appears Moderately Depressed.  Affect: Appears Moderately Constricted.  Anxiety: Moderate anxiety reported today.  Thought Process: wnl.  Thought Content: The patient denies any auditory or visual hallucinations today.  She also denies any delusional thinking. Perception: wnl.  Judgment: Fair to Good.  Insight: Fair to Good.  Cognition: Orientated to person, place and time.  Sleep:  Number of Hours: 5.25    Vital Signs:Blood pressure 121/73, pulse 105, temperature 98.3 F (36.8 C), temperature source Oral, resp. rate 16, height 5\' 3"  (1.6 m), weight 57.607 kg (127 lb).  Current Medications: Current Facility-Administered Medications  Medication Dose Route Frequency Provider Last Rate Last Dose  . acetaminophen (TYLENOL) tablet 650 mg  650 mg Oral Q6H PRN Kayla Spurr, PA-C      . alum & mag hydroxide-simeth (MAALOX/MYLANTA) 200-200-20 MG/5ML suspension 30 mL  30  mL Oral Q4H PRN Kayla Spurr, PA-C      . buPROPion (WELLBUTRIN XL) 24 hr tablet 150 mg  150 mg Oral QPC breakfast Kayla Deleon Kayla Grieshop, MD   150 mg at 08/25/11 0826  . ciprofloxacin (CIPRO) tablet 250 mg  250 mg Oral BID Kayla Deleon Kayla Kalama, MD   250 mg at 08/25/11 0826  . clonazePAM (KLONOPIN) tablet 0.25 mg  0.25 mg Oral [id number] Kayla Usery D Anju Sereno, MD      . escitalopram (LEXAPRO) tablet 20 mg  20 mg Oral Daily Kayla Spurr, PA-C   20 mg at 08/25/11 1610  . hydrOXYzine (ATARAX/VISTARIL) tablet 25 mg  25 mg Oral Q6H PRN Kayla Deleon Kayla Kinsel, MD   25 mg at 08/23/11 2214  . ibuprofen (ADVIL,MOTRIN) tablet 400 mg  400 mg Oral Q6H PRN Kayla Deleon Kayla Hilliker, MD   400 mg at 08/21/11 1620  . losartan (COZAAR) tablet 25 mg  25 mg Oral Daily Kayla Spurr, PA-C   25 mg at 08/25/11 0826  . magnesium hydroxide (MILK OF MAGNESIA) suspension 30 mL  30 mL Oral Daily PRN Kayla Spurr, PA-C      . traZODone (DESYREL) tablet 50 mg  50 mg Oral QHS PRN Kayla Spurr, PA-C   50 mg at 08/24/11 2152   Lab Results: No results found for this or any previous visit (from the past 48 hour(s)).  Time was spent today discussing with the patient her current symptoms. The patient states that she is sleeping reasonably well and reports a good appetite.  She does report moderate depression as well as moderate anxiety.  The  patient denies any auditory or visual hallucinations or delusional thinking.  She also denies any suicidal or homicidal ideations.  The patient denies any medication related side effects today.  Treatment Plan Summary:  1. Daily contact with patient to assess and evaluate symptoms and progress in treatment  2. Medication management  3. The patient will deny suicidal ideations or homicidal ideations for 48 hours prior to discharge and have a depression and anxiety rating of 3 or less. The patient will also deny any auditory or visual hallucinations or delusional thinking or display any manic or hypomanic behaviors.  4.  The patient will display no evidence of substance withdrawal at time of discharge.   Plan:  1. Will continue the medication Lexapro 20 mgs po q am for depression. 2. Will continue the medication Wellbutrin SR 150 mgs po q am to further augment the patient's antidepressant regiment. 3. Will start the medication Klonopin at 0.25 mgs po q am, 2 pm and hs for anxiety. 4. Will continue to monitor. 5. Laboratory Studies reviewed.  Kayla Deleon 08/25/2011, 2:12 PM

## 2011-08-25 NOTE — Progress Notes (Signed)
Patient ID: Kayla Deleon, female   DOB: 05/01/1951, 61 y.o.   MRN: [id number] Pt reports she did not sleep well because she was trying not to disturb her roommate.  She sais she was indecisive about questions on her self inventory and was having trouble choosing one number to describe her depression and anxiety.  Said that she feels her problems are insignificent compaed to other's. But says she found herself "freezing" and not being able to be herself when her job was being reformulated.  Pt did show more affect today.

## 2011-08-25 NOTE — Progress Notes (Signed)
Pt attended discharge planning group and actively participated.  Pt presents with anxious mood and affect.  Pt ranks depression and anxiety at a 5 today.  Pt denies SI.  Pt discussed still being worried about her job security but feels less anxious about notifying her job after SW assisted her yesterday with sending a generic letter to HR yesterday.  Pt states that she wants to be written out of work until her appointment after d/c.  SW referred pt to [facility] for medication management and therapy.  Pt's appointment with NP is next week.  No further needs voiced by pt at this time.    Reyes Ivan, LCSWA 08/25/2011  10:09 AM

## 2011-08-25 NOTE — Progress Notes (Signed)
Grief and loss group  Facilitated grief and loss group on [facility] [address]. Discussed types of losses one may encounter (re: death/dying, job loss, relationships, self). Group largely focused on loss of sense of self and grief reactions to that (re: anger, depression/sadness, loneliness). Facilitated short mindfulness activity to assist pt's in sitting w/ emotions related to grief and being able to experience them non-judgmentally before letting them go. Group was initially engaged and became more quiet as time progressed. Group was supportive of one another and provided feedback.  Pt was progressively engaged in group, did not share any personal narratives surrounding grief/loss, but was supportive of other group members.  Vianca Bracher B MS, LPCA, NCC

## 2011-08-25 NOTE — Progress Notes (Signed)
[facility] Group Notes: (Counselor/Nursing/MHT/Case Management/Adjunct) 08/25/2011   @ 11:00 Feelings About Diagnosis  Type of Therapy:  Group Therapy  Participation Level:  Minimal  Participation Quality:  Attentive  Affect:  Blunted  Cognitive:  Appropriate  Insight:  None  Engagement in Group: Minimal  Engagement in Therapy:  Minimal  Modes of Intervention:  Support and Exploration  Summary of Progress/Problems: Dacey was attentive and at times nodded along, bug did not share her personal thoughts/feelings.   Billie Lade 08/25/2011  1:15 PM

## 2011-08-25 NOTE — Tx Team (Signed)
Interdisciplinary Treatment Plan Update (Adult)  Date: 08/25/2011  Time Reviewed: 10:00 AM  Progress in Treatment:  Attending groups: Yes  Participating in groups: Yes  Taking medication as prescribed: Yes  Tolerating medication: Yes  Family/Significant other contact made: Yes, contact made with husband Patient understands diagnosis: Yes  Discussing patient identified problems/goals with staff: Yes  Medical problems stabilized or resolved: Yes  Denies suicidal/homicidal ideation: Yes  Issues/concerns per patient self-inventory: None identified  Other: N/A  New problem(s) identified: None Identified  Reason for Continuation of Hospitalization:  Anxiety  Depression  Medication stabilization  Interventions implemented related to continuation of hospitalization: mood stabilization, medication monitoring and adjustment, group therapy and psycho education, safety checks q 15 mins  Additional comments: N/A   Estimated length of stay: 1-2 days   Discharge Plan: Pt will follow up at [facility] for medication management and therapy.  New goal(s): N/A  Review of initial/current patient goals per problem list:  1. Goal(s): Reduce depressive symptoms  Met: No Target date: by discharge  As evidenced by: Reducing depression from a 10 to a 3 as reported by pt. Pt ranks at a 5 today.  2. Goal (s): Reduce/Eliminate suicidal ideation  Met: Yes  Target date: by discharge  As evidenced by: pt denies SI today.  3. Goal(s): Reduce anxiety symptoms  Met: No Target date: by discharge  As evidenced by: Reduce anxiety from a 10 to a 3 as reported by pt. Pt ranks at a 5 today.  Attendees:  Patient: Kayla Deleon 08/25/2011 10:01 AM   Family:    Physician: Franchot Gallo, MD  08/25/2011 10:00 AM   Nursing: Neill Loft, RN  08/25/2011 10:01 AM   Case Manager: Reyes Ivan, LCSWA  08/25/2011 10:00 AM   Counselor: Angus Palms, LCSW  08/25/2011 10:00 AM   Other: Osborn Coho, LCSWA   08/25/2011 10:00 AM   Other:  08/25/2011 10:00 AM   Other:    Other:    Scribe for Treatment Team:  Carmina Miller, 08/25/2011 , 10:00 AM

## 2011-08-26 NOTE — Progress Notes (Signed)
Recreation Therapy Notes  08/26/2011         Time: 1415      Group Topic/Focus: The focus of this group is on enhancing the patient's understanding of leisure, barriers to leisure, and the importance of engaging in positive leisure activities upon discharge for improved total health.  Participation Level: Active  Participation Quality: Attentive  Affect: Appropriate  Cognitive: Oriented   Additional Comments: None.   Kayla Deleon 08/26/2011 2:50 PM

## 2011-08-26 NOTE — Progress Notes (Signed)
[facility] MD Progress Note  08/26/2011 12:24 PM  Diagnosis:  Axis I: Major Depressive Disorder - Recurrent.  Generalized Anxiety Disorder.   The patient was seen today and reports the following:   Sleep: The patient reports to sleeping well last night. Appetite: The patient reports a good appetite.   Mild>(1-10) >Severe  Hopelessness (1-10): 0  Depression (1-10): 5  Anxiety (1-10): 6-7   Suicidal Ideation: The patient adamantly denies any suicidal ideations today.  Plan: No  Intent: No  Means: No   Homicidal Ideation: The patient adamantly denies any homicidal ideations today.  Plan: No  Intent: No.  Means: No   Eye Contact: Good.  General Appearance/Behavior: The patient remained friendly and cooperative during the interview today.  Motor Behavior: wnl.  Speech: Appropriate in rate and volume with no pressuring of speech today.  Mental Status: Alert and Oriented x 3  Level of Consciousness: Alert  Mood: Appears Moderately Depressed.  Affect: Appears Moderately Constricted.  Anxiety: Moderate anxiety reported today.  Thought Process: wnl.  Thought Content: The patient denies any auditory or visual hallucinations today. She also denies any delusional thinking.  Perception: wnl.  Judgment: Fair to Good.  Insight: Fair to Good.  Cognition: Orientated to person, place and time.  Sleep:  Number of Hours: 6.75    Vital Signs:Blood pressure 133/78, pulse 106, temperature 97.7 F (36.5 C), temperature source Oral, resp. rate 16, height 5\' 3"  (1.6 m), weight 57.607 kg (127 lb).  Current Medications: Current Facility-Administered Medications  Medication Dose Route Frequency Provider Last Rate Last Dose  . acetaminophen (TYLENOL) tablet 650 mg  650 mg Oral Q6H PRN Verne Spurr, PA-C      . alum & mag hydroxide-simeth (MAALOX/MYLANTA) 200-200-20 MG/5ML suspension 30 mL  30 mL Oral Q4H PRN Verne Spurr, PA-C      . buPROPion (WELLBUTRIN XL) 24 hr tablet 150 mg  150 mg Oral QPC  breakfast Curlene Labrum Kareema Keitt, MD   150 mg at 08/26/11 0843  . ciprofloxacin (CIPRO) tablet 250 mg  250 mg Oral BID Curlene Labrum Shea Swalley, MD   250 mg at 08/26/11 0842  . clonazePAM (KLONOPIN) tablet 0.25 mg  0.25 mg Oral [id number] Arora Coakley D Latrica Clowers, MD      . escitalopram (LEXAPRO) tablet 20 mg  20 mg Oral Daily Verne Spurr, PA-C   20 mg at 08/26/11 4098  . hydrOXYzine (ATARAX/VISTARIL) tablet 25 mg  25 mg Oral Q6H PRN Curlene Labrum Artelia Game, MD   25 mg at 08/23/11 2214  . ibuprofen (ADVIL,MOTRIN) tablet 400 mg  400 mg Oral Q6H PRN Curlene Labrum Djeneba Barsch, MD   400 mg at 08/21/11 1620  . losartan (COZAAR) tablet 25 mg  25 mg Oral Daily Verne Spurr, PA-C   25 mg at 08/26/11 0844  . magnesium hydroxide (MILK OF MAGNESIA) suspension 30 mL  30 mL Oral Daily PRN Verne Spurr, PA-C      . traZODone (DESYREL) tablet 50 mg  50 mg Oral QHS PRN Verne Spurr, PA-C   50 mg at 08/25/11 2159   Lab Results:  Results for orders placed during the hospital encounter of 08/21/11 (from the past 48 hour(s))  COMPREHENSIVE METABOLIC PANEL     Status: Abnormal   Collection Time   08/25/11  8:10 PM      Component Value Range Comment   Sodium 135  135 - 145 (mEq/L)    Potassium 3.8  3.5 - 5.1 (mEq/L)    Chloride 100  96 - 112 (  mEq/L)    CO2 27  19 - 32 (mEq/L)    Glucose, Bld 119 (*) 70 - 99 (mg/dL)    BUN 11  6 - 23 (mg/dL)    Creatinine, Ser 0.45  0.50 - 1.10 (mg/dL)    Calcium 9.1  8.4 - 10.5 (mg/dL)    Total Protein 7.0  6.0 - 8.3 (g/dL)    Albumin 3.4 (*) 3.5 - 5.2 (g/dL)    AST 15  0 - 37 (U/L)    ALT 16  0 - 35 (U/L)    Alkaline Phosphatase 54  39 - 117 (U/L)    Total Bilirubin 0.3  0.3 - 1.2 (mg/dL)    GFR calc non Af Amer 69 (*) >90 (mL/min)    GFR calc Af Amer 80 (*) >90 (mL/min)    Lab Results: No results found for this or any previous visit (from the past 48 hour(s)).   Time was spent today discussing with the patient her current symptoms. The patient states that she slept well last night and reported a good  appetite. She continues to report moderate depression as well as moderate anxiety. The patient denies any auditory or visual hallucinations or delusional thinking. She also denies any suicidal or homicidal ideations. The patient denies any medication related side effects today.  She did say that starting the medication Klonopin yesterday helped with her anxiety.  She would like to remain on the current dosage of Klonopin for 24 hours before considering an adjustment.   Treatment Plan Summary:  1. Daily contact with patient to assess and evaluate symptoms and progress in treatment  2. Medication management  3. The patient will deny suicidal ideations or homicidal ideations for 48 hours prior to discharge and have a depression and anxiety rating of 3 or less. The patient will also deny any auditory or visual hallucinations or delusional thinking or display any manic or hypomanic behaviors.  4. The patient will display no evidence of substance withdrawal at time of discharge.   Plan:  1. Will continue the medication Lexapro 20 mgs po q am for depression.  2. Will continue the medication Wellbutrin SR 150 mgs po q am to further augment the patient's antidepressant regiment.  3. Will continue the medication Klonopin at 0.25 mgs po q am, 2 pm and hs for anxiety.  4. Will continue to monitor.  5. Laboratory Studies reviewed.  Kayla Deleon 08/26/2011, 12:24 PM

## 2011-08-26 NOTE — Progress Notes (Signed)
[facility] Group Notes: (Counselor/Nursing/MHT/Case Management/Adjunct) 08/26/2011   @1 :15pm Emotion Regulation  Type of Therapy:  Group Therapy  Participation Level:  Minimal  Participation Quality:  Attentive  Affect:  Blunted  Cognitive:  Appropriate  Insight:  None  Engagement in Group: None   Engagement in Therapy:  None  Modes of Intervention:  Support and Exploration  Summary of Progress/Problems:  Kayla Deleon  was attentive but not engaged in group process    Billie Lade 08/26/2011 2:40 PM

## 2011-08-26 NOTE — Progress Notes (Signed)
Pt is asleep at this time.Pt appears to be in no signs of distress at this time. Pt remains safe with q11min checks.

## 2011-08-26 NOTE — Discharge Planning (Addendum)
Pt attended group on today. Pt appeared to have a flat mood and affect. Pt rated her depression as a 5 and her anxiety as a 7. Pt stated that " she became sick and vomited this morning due to her worrying." Pt verbalized that "she was worried about work and if she was going to be covered." Pt stated that she would like for her husband to be contacted in regards to discharge planning. D/C plans continue to be to follow up with [facility]. No other concerns voiced at this time.  Safety planning and suicide prevention discussed.   Pt wanted SW to contact her husband, Kittie Krizan.  Mr. Downard wanted to know about how pt is doing and the aftercare plans.  Mr. Bezanson states that he saw pt going downhill when she stopped taking Wellbutrin.  Mr. Riggle states that pt did the best on Wellbutrin and Lexapro.  Mr. Shapley discussed locking up his guns and helping monitor pt's meds.  Mr. Coburn had no further concerns.    Reyes Ivan, LCSWA 08/26/2011  12:40 PM

## 2011-08-26 NOTE — Progress Notes (Signed)
Patient ID: Kayla Deleon, female   DOB: 1951/03/23, 61 y.o.   MRN: [id number] Pt reported on her self inventory that she slept well, appetite good, her ability to pay attention is good, depression a 5-6/10, denies SI/HI/AVH.  She did vomit after breakfast this am but no stomach issues after this am.  [name] stated she started gagging this morning due to 'worry'.

## 2011-08-27 NOTE — Progress Notes (Signed)
Pt attended discharge planning group and actively participated.  Pt presents with calm mood and affect.  Pt ranks depression and anxiety at a 3 today.  Pt denies SI.  Pt reports feeling her mood is improving but still feels indecisive and worried.  Pt will follow up at [facility] for medication management and therapy.  No further needs voiced by pt at this time.    Reyes Ivan, LCSWA 08/27/2011  9:50 AM

## 2011-08-27 NOTE — Progress Notes (Signed)
Patient ID: Kayla Deleon, female   DOB: 1951/03/22, 61 y.o.   MRN: [id number] Pt denies SI/HI/AVH, on her self inventory she reported she slept well, appetite good, energy level normal, 3/10 on depression.  When she goes home she plans to stay on her meds and follow-up with her MD.  [name] feels she is stuck on making decisions but feels it is part of her depression and should improve.

## 2011-08-27 NOTE — Progress Notes (Signed)
Patient ID: Kayla Deleon, female   DOB: [birth year], 61 y.o.   MRN: [id number] Pt informed the writer that she, "woke up this morning with gagging and anxiety". Stated that after breakfast she vomited. "They gave me an antianxiety pill". Pt states she feels better and isn't experiencing anxiety at this time. Support and encouragement was offered.

## 2011-08-27 NOTE — Progress Notes (Signed)
Pt attending evening group and actively participated. Pt is pleasant and cooperative. Pt had no complaints. Pt was offered support and encouragement. Pt is receptive to treatment and safety maintained on unit.

## 2011-08-27 NOTE — Progress Notes (Signed)
Pt is resting in bed with eyes closed. RR WNL, even and unlabored. Color WDL. Level III obs in place for safety and pt is safe. Lawrence Marseilles

## 2011-08-27 NOTE — Progress Notes (Signed)
Patient ID: DEBBE CRUMBLE, female   DOB: 1950/09/17, 61 y.o.   MRN: [id number] Fully alert female, she is relaxing in bed reading a book. Pleasant on approach. She reports her depression a 3/10 today, and her anxiety at 3/10 today. This is on a 1-10 scale if 10 is the worst symptoms. Denies suicidal thoughts. She reports she awoke and did quite well this morning with no excess anxiety. She taken a 0.25 Klonopin at bedtime last night, along with a dose of trazodone.  She states that when she first started taking the Klonopin she felt "opened up, like I can see new opportunities." Then she got drowsy in the middle of the days and she turned away for 2 PM dose. She has tremendous fears that she will go home become very anxious in the early morning, again to pace, mentally ruminate, then she begins to gag, regurgitate and vomit. This has been her pattern with acute anxiety. However she admits she did notice today. She continues to ruminate on her fears.  We talked about alternatives. She has done well this morning, and I will move her Klonopin to 0.25 mg Q. 7 AM and she is in agreement with this. She does not want me to ask for extra days from her insurance company and feels that this is not necessary. We have reviewed her labs. There is nothing abnormal requiring internal medicine followup.  Objective: Pleasant, fully alert, appropriate. Mildly anxious affect. Denies suicidal thoughts. Speech normal.  Insight is quite good, and she is looking forward to outpatient counseling. No suicidal thoughts, no homicidal thoughts.   Assessment: Generalized anxiety disorder, MDD.  Plan: Continue current meds. Med education done. Reviewed all meds along with purpose, effects and potential side-effects. Discussed treatment plan going forward.  Consider discharge in am.

## 2011-08-28 MED ORDER — ESCITALOPRAM OXALATE 20 MG PO TABS: 20.0000 mg | ORAL_TABLET | Freq: Every day | ORAL | Status: AC

## 2011-08-28 MED ORDER — TRAZODONE HCL 50 MG PO TABS: ORAL_TABLET | ORAL | Status: DC

## 2011-08-28 MED ORDER — LOSARTAN POTASSIUM 25 MG PO TABS: 25.0000 mg | ORAL_TABLET | Freq: Every day | ORAL | Status: DC

## 2011-08-28 MED ORDER — BUPROPION HCL ER (XL) 150 MG PO TB24: 150.0000 mg | ORAL_TABLET | Freq: Every day | ORAL | Status: DC

## 2011-08-28 MED ORDER — CLONAZEPAM 0.5 MG PO TABS: 0.2500 mg | ORAL_TABLET | ORAL | Status: DC

## 2011-08-28 NOTE — Progress Notes (Signed)
Discharge instructions reviewed  with pt.. Pt. . Denies SI/HI and verbalizes an understanding of discharge instructions. Pt. Discharged and escorted from unit by Clinical research associate.  Encouragement and support given.  Pt. Receptive.

## 2011-08-28 NOTE — Progress Notes (Signed)
[facility] Group Notes: (Counselor/Nursing/MHT/Case Management/Adjunct) 08/21/2011   @11 :00am Preventing Relapse  Type of Therapy:  Group Therapy  Participation Level:  Minimal  Participation Quality: Attentive    Affect:  Blunted  Cognitive:  Appropriate  Insight:  None  Engagement in Group: Limited  Engagement in Therapy:  Limited  Modes of Intervention:  Support and Exploration  Summary of Progress/Problems: Kayla Deleon was present and appeared attentive, but did not engage personally in group process.  Billie Lade 08/21/2011 12:23 PM

## 2011-08-28 NOTE — [facility] Suicide Risk Assessment (Signed)
Suicide Risk Assessment  Discharge Assessment     Demographic factors:  Caucasian  Current Mental Status Per Nursing Assessment::   On Admission:    At Discharge:  The patient was AO x 3.  She denied any suicidal or homicidal ideations as well as any auditory or visual hallucinations or delusional thinking.  Current Mental Status Per Physician:  Diagnosis:  Axis I: Major Depressive Disorder - Recurrent.  Generalized Anxiety Disorder.   The patient was seen today and reports the following:   Sleep: The patient reports to sleeping well last night.  Appetite: The patient reports a good appetite.   Mild>(1-10) >Severe  Hopelessness (1-10): 0  Depression (1-10): 2  Anxiety (1-10): 3   Suicidal Ideation: The patient adamantly denies any suicidal ideations today.  Plan: No  Intent: No  Means: No   Homicidal Ideation: The patient adamantly denies any homicidal ideations today.  Plan: No  Intent: No.  Means: No   Eye Contact: Good.  General Appearance/Behavior: The patient was again friendly and cooperative during the interview today.  Motor Behavior: wnl.  Speech: Appropriate in rate and volume with no pressuring of speech today.  Mental Status: Alert and Oriented x 3  Level of Consciousness: Alert  Mood: Mildly depressed.  Affect: Mildly constricted.  Anxiety: Mild anxiety reported today.  Thought Process: wnl.  Thought Content: The patient denies any auditory or visual hallucinations today. She also denies any delusional thinking.  Perception: wnl.  Judgment: Good.  Insight: Good.  Cognition: Orientated to person, place and time.   Loss Factors: No Acute Losses Reported.  Historical Factors: Family history of mental illness or substance abuse  Risk Reduction Factors:   Good Family Support.  Good Access to Healthcare.  Good Physical Health.  Continued Clinical Symptoms:  Depression:   Anhedonia More than one psychiatric diagnosis Previous Psychiatric  Diagnoses and Treatments  Discharge Diagnoses:   AXIS I:   Major Depressive Disorder - Recurrent.    Generalized Anxiety Disorder.  AXIS II:   None Noted. AXIS III:  1. Hypertension.  AXIS IV:   Chronic Mental Illness. AXIS V:   GAF at time of admission approximately 45.  GAF at time of discharge approximately 70,  Cognitive Features That Contribute To Risk:  None Noted.   Suicide Risk:  Minimal: No identifiable suicidal ideation.  Patients presenting with no risk factors but with morbid ruminations; may be classified as minimal risk based on the severity of the depressive symptoms  Plan Of Care/Follow-up recommendations:  Activity:  As tolerated. Diet:  Heart Healthy. Other:  Please take all medications only as directed and keep all scheduled follow up appointments.  Time was spent today discussing with the patient her current symptoms.  The patient reports to sleeping well last night and reports a good appetite. The patient reports mild symptoms of depression as well as mild anxiety symptoms.  She denies any auditory or visual hallucinations or delusional thinking.  The patient is requesting discharge today and this will be ordered per her request.  Treatment Plan Summary:  1. Daily contact with patient to assess and evaluate symptoms and progress in treatment  2. Medication management  3. The patient will deny suicidal ideations or homicidal ideations for 48 hours prior to discharge and have a depression and anxiety rating of 3 or less. The patient will also deny any auditory or visual hallucinations or delusional thinking or display any manic or hypomanic behaviors.  4. The patient will display no  evidence of substance withdrawal at time of discharge.   Plan:  1. Will continue the patient on her current medications. 2. Will continue to monitor.  3. Laboratory Studies reviewed. 4. The patient will be discharged today as requested to outpatient follow up.  Amadeus Oyama 08/28/2011, 1:12 PM

## 2011-08-28 NOTE — Progress Notes (Signed)
Recreation Therapy Notes  08/28/2011         Time: 1415      Group Topic/Focus: The focus of this group is on discussing various styles of communication and communicating assertively using 'I' (feeling) statements.  Participation Level: Active  Participation Quality: Appropriate and Attentive  Affect: Appropriate  Cognitive: Oriented   Additional Comments: None.   Krish Bailly 08/28/2011 3:52 PM

## 2011-08-28 NOTE — Progress Notes (Signed)
[facility] Case Management Discharge Plan:  Will you be returning to the same living situation after discharge: Yes,  return home At discharge, do you have transportation home?:Yes,  daughter to pick pt up Do you have the ability to pay for your medications:Yes,  access to meds  Release of information consent forms completed and in the chart;  Patient's signature needed at discharge.  Patient to Follow up at:  Follow-up Information    Follow up with [facility] on 09/01/2011. (Appointment scheduled at 1:00 pm with Saul Fordyce, NP)    Contact information:   [address] [place], [place] [address] [phone]         Patient denies SI/HI:   Yes,  denies SI/HI    Safety Planning and Suicide Prevention discussed:  Yes,  discussed with pt  Barrier to discharge identified:No.  Summary and Recommendations: Pt attended discharge planning group and actively participated.  Pt presents with calm mood and affect.  Pt ranks depression at a 3 and anxiety at a 2 today.  Pt denies SI.  Pt reports feeling stable to d/c today.  No recommendations from SW.  No further needs voiced by pt.  Pt stable to discharge.     Carmina Miller 08/28/2011, 10:54 AM

## 2011-09-01 NOTE — Progress Notes (Signed)
Patient Discharge Instructions:  Psychiatric Admission Assessment Note Provided,  09/01/2011 After Visit Summary (AVS) Provided,  09/01/2011 Face Sheet Provided, 09/01/2011 Faxed/Sent to the Next Level Care provider:  09/01/2011 Provided Suicide Risk Assessment - Discharge Assessment 09/01/2011  Faxed to [facility] @ [phone]  Wandra Scot, 09/01/2011, 4:45 PM

## 2011-09-04 NOTE — Discharge Summary (Signed)
Physician Discharge Summary Note  Patient:  Kayla Deleon is an 61 y.o., female MRN:  [id number] DOB:  [birth year] Patient phone:  [phone] (home)  Patient address:   [address] [place] [address],   Date of Admission:  08/21/2011 Date of Discharge: 08/28/2011  Axis Diagnosis:   AXIS I:  Major Depression, Recurrent, Severe; Generalized Anxiety Disorder AXIS II:  No Diagnosis AXIS III:  Hypertension Past Medical History  Diagnosis Date  . Hypertension   . Mental disorder   . Depression   . Anxiety    AXIS IV:  Financial and occupational pressures AXIS V:  61-70 mild symptoms  Level of Care:  OP  Hospital Course:  [facility] H. admission for Charo, who works full time, and had stopped her Wellbutrin last August thinking she didn't need it anymore, then found herself slipping into a deep depression with psychomotor slowing. She started back on the Wellbutrin again on her own but apparently at the maximum dose in found herself feeling worse instead of better. Her anxiety was so severe that she would awaken in the morning and have surges in anxiety, become nauseous and begin wretching for long periods of time.   She was admitted to our mood disorders program, and admitted that she had been having some suicidal thoughts. She was having paralyzing anxiety worrying about situations at work,.  Feared she would lose her job, having a lot of difficulty functioning with her depression and psychomotor slowing, and she is the only breadwinner at home.  We continued her Lexapro 20 mg daily, and restarted the Wellbutrin at a lower 150 mg dose. And we stopped the Abilify. Her gradually she responded to medications, and Klonopin was eventually added a low dose in order to control her anxiety symptoms and she was taking this when she first awoke in the morning.   By April 12 reporting that she was sleeping well and appetite was good. No more racing thoughts. Speech was more fluent and her husband  felt that she was back to herself. She felt ready for outpatient treatment.  Consults:  None  Significant Diagnostic Studies:  TSH 2.873, free T4-1.39. B12 level 477. Urine drug screen negative for all substances.   Discharge Vitals:   Blood pressure 110/69, pulse 102, temperature 97 F (36.1 C), temperature source Oral, resp. rate 18, height 5\' 3"  (1.6 m), weight 57.607 kg (127 lb).  Mental Status Exam: See Mental Status Examination and Suicide Risk Assessment completed by Attending Physician prior to discharge.  Discharge destination:  Home  Is patient on multiple antipsychotic therapies at discharge:  No   Has Patient had three or more failed trials of antipsychotic monotherapy by history:  No  Recommended Plan for Multiple Antipsychotic Therapies: N/A   Medication List  As of 09/04/2011  3:20 PM   STOP taking these medications         ALPRAZolam 0.5 MG tablet      ARIPiprazole 2 MG tablet         TAKE these medications      Indication    buPROPion 150 MG 24 hr tablet   Commonly known as: WELLBUTRIN XL   Take 1 tablet (150 mg total) by mouth daily after breakfast. For depression       clonazePAM 0.5 MG tablet   Commonly known as: KLONOPIN   Take 0.5 tablets (0.25 mg total) by mouth 3 (three) times daily at 8am, 2pm and bedtime. For anxiety.  escitalopram 20 MG tablet   Commonly known as: LEXAPRO   Take 1 tablet (20 mg total) by mouth daily. For depression and anxiety.       ibuprofen 200 MG tablet   Commonly known as: ADVIL,MOTRIN   Take 400 mg by mouth every 6 (six) hours as needed. For headache       losartan 25 MG tablet   Commonly known as: COZAAR   Take 1 tablet (25 mg total) by mouth daily. For blood pressure       neomycin-bacitracin-polymyxin Oint   Commonly known as: NEOSPORIN   Apply 1 application topically daily as needed. For chaffing behind both ears       traZODone 50 MG tablet   Commonly known as: DESYREL   Take one tablet (50mg ) daily  at bedtime for sleep.            Follow-up Information    Follow up with [facility] on 09/01/2011. (Appointment scheduled at 1:00 pm with Saul Fordyce, NP)    Contact information:   [address] [place], [place] [address] [phone]         Follow-up recommendations:  Activity Unrestricted and Diet regular  Signed: Jeriah Skufca A 09/04/2011, 3:20 PM

## 2011-09-19 NOTE — H&P (Signed)
I have read the H&P, interviewed the patient, and I agree with the findings above.  Gabriana Wilmott, MD   

## 2012-02-19 ENCOUNTER — Ambulatory Visit (HOSPITAL_BASED_OUTPATIENT_CLINIC_OR_DEPARTMENT_OTHER): Payer: 59 | Attending: Otolaryngology

## 2012-02-19 VITALS — Ht 64.0 in | Wt 130.0 lb

## 2012-02-19 DIAGNOSIS — G4733 Obstructive sleep apnea (adult) (pediatric): Secondary | ICD-10-CM

## 2012-02-20 DIAGNOSIS — G4733 Obstructive sleep apnea (adult) (pediatric): Secondary | ICD-10-CM

## 2012-03-03 NOTE — Procedures (Signed)
NAMECHERITA, Kayla Deleon                    ACCOUNT NO.:  [id number]  MEDICAL RECORD NO.:  [id number]          PATIENT TYPE:  OUT  LOCATION:  SLEEP CENTER                 [facility]  PHYSICIAN:  Clinton D. Maple Hudson, MD, FCCP, FACPDATE OF BIRTH:  August 03, 1950  DATE OF STUDY:  02/19/2012                           NOCTURNAL POLYSOMNOGRAM  REFERRING PHYSICIAN:  Zola Button T. Lazarus Salines, M.D.  INDICATION FOR STUDY:  Hypersomnia with sleep apnea.  EPWORTH SLEEPINESS SCORE:  5/24.  BMI 22, weight 130 pounds, height 64 inches, neck 13 inches.  MEDICATIONS:  Home medications are charted and reviewed.  SLEEP ARCHITECTURE:  Split study protocol.  Total sleep time 125 minutes with sleep efficiency 86.5%.  Stage I was 2.8%, stage II 93.2%, stage III 4%.  REM absent.  Sleep latency 19.5 minutes.  Awake after sleep onset 1 minute.  Arousal index 22.1.  Bedtime medication:  None.  RESPIRATORY DATA:  Split study protocol.  Apnea/hypopnea index (AHI) 39.8 per hour.  A total of 83 events were scored including 11 obstructive apneas and 72 hypopneas.  Events were not positional.  CPAP was titrated to 12 CWP, AHI 0 per hour.  She wore a medium ResMed Quattro full-face mask with heated humidifier.  OXYGEN DATA:  Moderately loud snoring before CPAP with oxygen desaturation to a nadir of 76% on room air.  With CPAP control, snoring was prevented and mean oxygen saturation held 94.7% on room air.  CARDIAC DATA:  Normal sinus rhythm.  MOVEMENT/PARASOMNIA:  During the diagnostic phase, a total of 105 limb jerks were counted, of which 13 were associated with arousals for a movement with arousal index of 6.2 per hour.  There were no significant limb jerks counted during the titration phase, suggesting that the early events mainly reflected respiratory arousal.  Bathroom x1.  IMPRESSION/RECOMMENDATION: 1. Severe obstructive sleep apnea/hypopnea syndrome, AHI 39.8 per hour     with non-positional events.  Moderately  loud snoring with oxygen     desaturation to a nadir of 76% on room air. 2. Successful CPAP titration to 12 CWP, AHI 0 per hour.  She wore a     medium ResMed Quattro full-face mask with heated humidifier.     Snoring was prevented and mean oxygen saturation improved to 94.7%     on room air.     Clinton D. Maple Hudson, MD, [facility], FACP Diplomate, American Board of Sleep Medicine    CDY/MEDQ  D:  02/20/2012 11:47:11  T:  02/20/2012 14:17:03  Job:  [id number]

## 2012-09-19 ENCOUNTER — Other Ambulatory Visit: Payer: Self-pay

## 2012-09-19 DIAGNOSIS — Z1231 Encounter for screening mammogram for malignant neoplasm of breast: Secondary | ICD-10-CM

## 2012-10-31 ENCOUNTER — Ambulatory Visit
Admission: RE | Admit: 2012-10-31 | Discharge: 2012-10-31 | Disposition: A | Discharge status: Home / Self Care | Payer: 59 | Source: Ambulatory Visit

## 2012-10-31 DIAGNOSIS — Z1231 Encounter for screening mammogram for malignant neoplasm of breast: Secondary | ICD-10-CM

## 2012-11-01 ENCOUNTER — Other Ambulatory Visit: Payer: Self-pay | Admitting: Family Medicine

## 2012-11-01 DIAGNOSIS — R928 Other abnormal and inconclusive findings on diagnostic imaging of breast: Secondary | ICD-10-CM

## 2012-11-14 ENCOUNTER — Other Ambulatory Visit: Payer: Self-pay

## 2012-11-22 ENCOUNTER — Emergency Department (HOSPITAL_COMMUNITY): Payer: 59

## 2012-11-22 ENCOUNTER — Emergency Department (HOSPITAL_COMMUNITY)
Admission: EM | Admit: 2012-11-22 | Discharge: 2012-11-22 | Disposition: A | Discharge status: Home / Self Care | Payer: 59 | Attending: Emergency Medicine | Admitting: Emergency Medicine

## 2012-11-22 ENCOUNTER — Encounter (HOSPITAL_COMMUNITY): Payer: Self-pay

## 2012-11-22 DIAGNOSIS — R0789 Other chest pain: Secondary | ICD-10-CM | POA: Insufficient documentation

## 2012-11-22 DIAGNOSIS — R0609 Other forms of dyspnea: Secondary | ICD-10-CM | POA: Insufficient documentation

## 2012-11-22 DIAGNOSIS — F411 Generalized anxiety disorder: Secondary | ICD-10-CM | POA: Insufficient documentation

## 2012-11-22 DIAGNOSIS — R079 Chest pain, unspecified: Secondary | ICD-10-CM

## 2012-11-22 DIAGNOSIS — Z79899 Other long term (current) drug therapy: Secondary | ICD-10-CM | POA: Insufficient documentation

## 2012-11-22 DIAGNOSIS — F3289 Other specified depressive episodes: Secondary | ICD-10-CM | POA: Insufficient documentation

## 2012-11-22 DIAGNOSIS — I10 Essential (primary) hypertension: Secondary | ICD-10-CM | POA: Insufficient documentation

## 2012-11-22 DIAGNOSIS — Z7982 Long term (current) use of aspirin: Secondary | ICD-10-CM | POA: Insufficient documentation

## 2012-11-22 DIAGNOSIS — F329 Major depressive disorder, single episode, unspecified: Secondary | ICD-10-CM | POA: Insufficient documentation

## 2012-11-22 DIAGNOSIS — R110 Nausea: Secondary | ICD-10-CM | POA: Insufficient documentation

## 2012-11-22 DIAGNOSIS — R0989 Other specified symptoms and signs involving the circulatory and respiratory systems: Secondary | ICD-10-CM | POA: Insufficient documentation

## 2012-11-22 LAB — CBC WITH DIFFERENTIAL/PLATELET
Basophils Absolute: 0.1 10*3/uL (ref 0.0–0.1)
Basophils Relative: 1 % (ref 0–1)
Hemoglobin: 12.6 g/dL (ref 12.0–15.0)
MCHC: 33.2 g/dL (ref 30.0–36.0)
Monocytes Relative: 11 % (ref 3–12)
Neutro Abs: 2.7 10*3/uL (ref 1.7–7.7)
Neutrophils Relative %: 45 % (ref 43–77)
RDW: 12.6 % (ref 11.5–15.5)

## 2012-11-22 LAB — BASIC METABOLIC PANEL
BUN: 15 mg/dL (ref 6–23)
GFR calc Af Amer: 86 mL/min — ABNORMAL LOW (ref >90)
GFR calc non Af Amer: 75 mL/min — ABNORMAL LOW (ref >90)
Potassium: 4.3 mEq/L (ref 3.5–5.1)

## 2012-11-22 MED ORDER — ASPIRIN 325 MG PO TABS
325.0000 mg | ORAL_TABLET | Freq: Once | ORAL | Status: AC
  Administered 2012-11-22: 325 mg via ORAL
  Filled 2012-11-22: qty 1

## 2012-11-22 NOTE — Discharge Instructions (Signed)
I discussed your case with the [facility] cardiologist.   They will call you for a followup appointment.    If symptoms worsen, recommend return to [facility].   Tests today were normal

## 2012-11-22 NOTE — ED Provider Notes (Signed)
History    CSN: [id number] Arrival date & time 11/22/12  0808  First MD Initiated Contact with Patient 11/22/12 [phone]     Chief Complaint  Patient presents with  . Chest Pain   (Consider location/radiation/quality/duration/timing/severity/associated sxs/prior Treatment) HPI.... tightness under her bilateral breasts last night while walking with associated nausea and mild dyspnea. Symptoms dispersed spontaneously. Mild return of episode today.  Patient is symptom-free presently. Cardiac risk factors include hypercholesterolemia, mild hypertension, family history.  Nonsmoker.  No radiation of pain. Past Medical History  Diagnosis Date  . Hypertension   . Mental disorder   . Depression   . Anxiety    Past Surgical History  Procedure Laterality Date  . Cholecystectomy    . Cesarean section     No family history on file. History  Substance Use Topics  . Smoking status: Never Smoker   . Smokeless tobacco: Not on file  . Alcohol Use: Yes     Comment: social   OB History   Grav Para Term Preterm Abortions TAB SAB Ect Mult Living                 Review of Systems  All other systems reviewed and are negative.    Allergies  Shellfish allergy and Flagyl  Home Medications   Current Outpatient Rx  Name  Route  Sig  Dispense  Refill  . aspirin 325 MG tablet   Oral   Take 325 mg by mouth daily.         Marland Kitchen buPROPion (WELLBUTRIN XL) 150 MG 24 hr tablet   Oral   Take 1 tablet (150 mg total) by mouth daily after breakfast. For depression   30 tablet   0   . clonazePAM (KLONOPIN) 0.5 MG tablet   Oral   Take 0.5 tablets (0.25 mg total) by mouth 3 (three) times daily at 8am, 2pm and bedtime. For anxiety.   45 tablet   0   . escitalopram (LEXAPRO) 20 MG tablet   Oral   Take 1 tablet (20 mg total) by mouth daily. For depression and anxiety.   30 tablet   0   . ibuprofen (ADVIL,MOTRIN) 200 MG tablet   Oral   Take 400 mg by mouth every 6 (six) hours as needed. For  headache         . lamoTRIgine (LAMICTAL) 25 MG tablet   Oral   Take 50 mg by mouth daily.         . Multiple Vitamin (MULTIVITAMIN WITH MINERALS) TABS   Oral   Take 1 tablet by mouth daily.          BP 146/76  Pulse 85  Temp(Src) 98.3 F (36.8 C) (Oral)  Resp 16  SpO2 97% Physical Exam  Nursing note and vitals reviewed. Constitutional: She is oriented to person, place, and time. She appears well-developed and well-nourished.  HENT:  Head: Normocephalic and atraumatic.  Eyes: Conjunctivae and EOM are normal. Pupils are equal, round, and reactive to light.  Neck: Normal range of motion. Neck supple.  Cardiovascular: Normal rate, regular rhythm and normal heart sounds.   Pulmonary/Chest: Effort normal and breath sounds normal.  Abdominal: Soft. Bowel sounds are normal.  Musculoskeletal: Normal range of motion.  Neurological: She is alert and oriented to person, place, and time.  Skin: Skin is warm and dry.  Psychiatric: She has a normal mood and affect.    ED Course  Procedures (including critical care time) Labs Reviewed  BASIC METABOLIC PANEL - Abnormal; Notable for the following:    Glucose, Bld 111 (*)    GFR calc non Af Amer 75 (*)    GFR calc Af Amer 86 (*)    All other components within normal limits  CBC WITH DIFFERENTIAL  TROPONIN I   Dg Chest 2 View  11/22/2012   *RADIOLOGY REPORT*  Clinical Data: Chest pain  CHEST - 2 VIEW  Comparison: 11/18/2005  Findings: Cardiomediastinal contours are unchanged, within normal limits. Mild hyperinflation/chronic interstitial coarsening.  No consolidation, pleural effusion, or pneumothorax.  Surgical clips right upper quadrant.  No acute osseous finding.  IMPRESSION: No radiographic evidence of acute cardiopulmonary process.   Original Report Authenticated By: Jearld Lesch, M.D.   No diagnosis found.     Date: 11/22/2012  Rate: 77  Rhythm: normal sinus rhythm  QRS Axis: normal  Intervals: normal  ST/T Wave  abnormalities: normal  Conduction Disutrbances: none  Narrative Interpretation: unremarkable    MDM  Patient is symptom-free at this time.  EKG and troponin negative.  Discussed with your cardiologist on call.   Patient will followup as an outpatient for a stress test.   This information was discussed with the patient and her husband. They understand and agree.  Donnetta Hutching, MD 11/22/12 1126

## 2012-11-22 NOTE — ED Notes (Signed)
Pt c/o discomfort under bilateral rib/under breast area after walking yesterday with nausea, lasted for 4hrs with no changes, states woke up with some discomfort but denies now. States hx of reflux. Pt denies chest pain, diaphoresis.

## 2012-12-27 ENCOUNTER — Ambulatory Visit
Admission: RE | Admit: 2012-12-27 | Discharge: 2012-12-27 | Disposition: A | Discharge status: Home / Self Care | Payer: 59 | Source: Ambulatory Visit | Attending: Family Medicine | Admitting: Family Medicine

## 2012-12-27 DIAGNOSIS — R928 Other abnormal and inconclusive findings on diagnostic imaging of breast: Secondary | ICD-10-CM

## 2013-01-06 ENCOUNTER — Other Ambulatory Visit (HOSPITAL_COMMUNITY)
Admission: RE | Admit: 2013-01-06 | Discharge: 2013-01-06 | Disposition: A | Discharge status: Home / Self Care | Payer: 59 | Source: Ambulatory Visit | Attending: Family Medicine | Admitting: Family Medicine

## 2013-01-06 ENCOUNTER — Other Ambulatory Visit: Payer: Self-pay | Admitting: Family Medicine

## 2013-01-06 DIAGNOSIS — Z1151 Encounter for screening for human papillomavirus (HPV): Secondary | ICD-10-CM | POA: Insufficient documentation

## 2013-01-06 DIAGNOSIS — Z124 Encounter for screening for malignant neoplasm of cervix: Secondary | ICD-10-CM | POA: Insufficient documentation

## 2013-10-24 ENCOUNTER — Encounter: Payer: Self-pay | Admitting: Interventional Cardiology

## 2013-11-29 ENCOUNTER — Ambulatory Visit: Payer: Self-pay | Admitting: Interventional Cardiology

## 2014-01-08 ENCOUNTER — Encounter: Payer: Self-pay | Admitting: Cardiology

## 2014-01-10 ENCOUNTER — Ambulatory Visit: Payer: Self-pay | Admitting: Interventional Cardiology

## 2014-01-11 ENCOUNTER — Other Ambulatory Visit: Payer: Self-pay

## 2014-01-11 ENCOUNTER — Other Ambulatory Visit (HOSPITAL_COMMUNITY)
Admission: RE | Admit: 2014-01-11 | Discharge: 2014-01-11 | Disposition: A | Discharge status: Home / Self Care | Payer: 59 | Source: Ambulatory Visit | Attending: Family Medicine | Admitting: Family Medicine

## 2014-01-11 ENCOUNTER — Other Ambulatory Visit: Payer: Self-pay | Admitting: Family Medicine

## 2014-01-11 DIAGNOSIS — Z124 Encounter for screening for malignant neoplasm of cervix: Secondary | ICD-10-CM | POA: Insufficient documentation

## 2014-01-11 DIAGNOSIS — Z1151 Encounter for screening for human papillomavirus (HPV): Secondary | ICD-10-CM | POA: Diagnosis present

## 2014-01-11 DIAGNOSIS — Z1231 Encounter for screening mammogram for malignant neoplasm of breast: Secondary | ICD-10-CM

## 2014-01-15 LAB — CYTOLOGY - PAP

## 2014-01-18 ENCOUNTER — Ambulatory Visit
Admission: RE | Admit: 2014-01-18 | Discharge: 2014-01-18 | Disposition: A | Discharge status: Home / Self Care | Payer: 59 | Source: Ambulatory Visit

## 2014-01-18 DIAGNOSIS — Z1231 Encounter for screening mammogram for malignant neoplasm of breast: Secondary | ICD-10-CM

## 2014-11-10 ENCOUNTER — Emergency Department (HOSPITAL_COMMUNITY)
Admission: EM | Admit: 2014-11-10 | Discharge: 2014-11-10 | Disposition: A | Discharge status: Home / Self Care | Payer: 59 | Attending: Emergency Medicine | Admitting: Emergency Medicine

## 2014-11-10 ENCOUNTER — Encounter (HOSPITAL_COMMUNITY): Payer: Self-pay | Admitting: *Deleted

## 2014-11-10 DIAGNOSIS — Z86018 Personal history of other benign neoplasm: Secondary | ICD-10-CM | POA: Insufficient documentation

## 2014-11-10 DIAGNOSIS — Z79899 Other long term (current) drug therapy: Secondary | ICD-10-CM | POA: Insufficient documentation

## 2014-11-10 DIAGNOSIS — F419 Anxiety disorder, unspecified: Secondary | ICD-10-CM | POA: Insufficient documentation

## 2014-11-10 DIAGNOSIS — R079 Chest pain, unspecified: Secondary | ICD-10-CM | POA: Insufficient documentation

## 2014-11-10 DIAGNOSIS — Z87891 Personal history of nicotine dependence: Secondary | ICD-10-CM | POA: Insufficient documentation

## 2014-11-10 DIAGNOSIS — Z7982 Long term (current) use of aspirin: Secondary | ICD-10-CM | POA: Insufficient documentation

## 2014-11-10 DIAGNOSIS — R110 Nausea: Secondary | ICD-10-CM | POA: Insufficient documentation

## 2014-11-10 DIAGNOSIS — I10 Essential (primary) hypertension: Secondary | ICD-10-CM | POA: Insufficient documentation

## 2014-11-10 DIAGNOSIS — Z8739 Personal history of other diseases of the musculoskeletal system and connective tissue: Secondary | ICD-10-CM | POA: Insufficient documentation

## 2014-11-10 DIAGNOSIS — R197 Diarrhea, unspecified: Secondary | ICD-10-CM | POA: Diagnosis not present

## 2014-11-10 DIAGNOSIS — R630 Anorexia: Secondary | ICD-10-CM | POA: Insufficient documentation

## 2014-11-10 DIAGNOSIS — R1011 Right upper quadrant pain: Secondary | ICD-10-CM | POA: Diagnosis not present

## 2014-11-10 DIAGNOSIS — F329 Major depressive disorder, single episode, unspecified: Secondary | ICD-10-CM | POA: Diagnosis not present

## 2014-11-10 LAB — COMPREHENSIVE METABOLIC PANEL
ALBUMIN: 3.8 g/dL (ref 3.5–5.0)
ALK PHOS: 49 U/L (ref 38–126)
ALT: 43 U/L (ref 14–54)
AST: 72 U/L — AB (ref 15–41)
Anion gap: 12 (ref 5–15)
BUN: 17 mg/dL (ref 6–20)
CHLORIDE: 101 mmol/L (ref 101–111)
CO2: 22 mmol/L (ref 22–32)
CREATININE: 0.94 mg/dL (ref 0.44–1.00)
Calcium: 9 mg/dL (ref 8.9–10.3)
GFR calc Af Amer: >60 mL/min (ref >60)
Glucose, Bld: 106 mg/dL — ABNORMAL HIGH (ref 65–99)
Potassium: 4.2 mmol/L (ref 3.5–5.1)
Sodium: 135 mmol/L (ref 135–145)
Total Bilirubin: 0.8 mg/dL (ref 0.3–1.2)
Total Protein: 7.2 g/dL (ref 6.5–8.1)

## 2014-11-10 LAB — URINALYSIS, ROUTINE W REFLEX MICROSCOPIC
BILIRUBIN URINE: NEGATIVE
Glucose, UA: NEGATIVE mg/dL
Ketones, ur: NEGATIVE mg/dL
Leukocytes, UA: NEGATIVE
Nitrite: NEGATIVE
PH: 5 (ref 5.0–8.0)
Protein, ur: NEGATIVE mg/dL
SPECIFIC GRAVITY, URINE: 1.021 (ref 1.005–1.030)
Urobilinogen, UA: 0.2 mg/dL (ref 0.0–1.0)

## 2014-11-10 LAB — CBC
HCT: 39.1 % (ref 36.0–46.0)
Hemoglobin: 13.2 g/dL (ref 12.0–15.0)
MCH: 31 pg (ref 26.0–34.0)
MCHC: 33.8 g/dL (ref 30.0–36.0)
MCV: 91.8 fL (ref 78.0–100.0)
PLATELETS: 253 10*3/uL (ref 150–400)
RBC: 4.26 MIL/uL (ref 3.87–5.11)
RDW: 13.3 % (ref 11.5–15.5)
WBC: 9.4 10*3/uL (ref 4.0–10.5)

## 2014-11-10 LAB — URINE MICROSCOPIC-ADD ON

## 2014-11-10 LAB — LIPASE, BLOOD: Lipase: 79 U/L — ABNORMAL HIGH (ref 22–51)

## 2014-11-10 LAB — I-STAT TROPONIN, ED: Troponin i, poc: 0 ng/mL (ref 0.00–0.08)

## 2014-11-10 MED ORDER — SODIUM CHLORIDE 0.9 % IV BOLUS (SEPSIS)
1000.0000 mL | Freq: Once | INTRAVENOUS | Status: AC
  Administered 2014-11-10: 1000 mL via INTRAVENOUS

## 2014-11-10 MED ORDER — ONDANSETRON HCL 4 MG/2ML IJ SOLN
4.0000 mg | Freq: Once | INTRAMUSCULAR | Status: AC
  Administered 2014-11-10: 4 mg via INTRAVENOUS
  Filled 2014-11-10: qty 2

## 2014-11-10 NOTE — ED Provider Notes (Signed)
CSN: [id number]     Arrival date & time 11/10/14  2015 History   First MD Initiated Contact with Patient 11/10/14 2023     Chief Complaint  Patient presents with  . Abdominal Pain  . Chest Pain     (Consider location/radiation/quality/duration/timing/severity/associated sxs/prior Treatment) HPI Comments: 64 year old female complaining of sudden onset sharp right upper quadrant abdominal pain radiating to her epigastric area and up into her chest occurring 30 minutes prior to arrival. She was not doing anything specific when the pain began, however had chicken noodle soup 1 hour prior. Pain lasted about 20 minutes and subsided on its own, initially 10/10, described as gallbladder pain (despite not having a gallbladder, cholecystectomy ~30 years ago) now 1.5/10. Now states she only has a slight "hot" sensation in her right upper quadrant. Admits to nausea and dry heaving without vomiting. This morning, she had 5 episodes of nonbloody diarrhea with mild lower abdominal cramping which has since subsided. She took Imodium after the second episode of diarrhea. Today, she has not had a real appetite. Yesterday had one episode of diarrhea but was feeling okay. No sick contacts. Denies fever, chills, chest pain (pain was radiating from epigastric area, no chest pain per pt), shortness of breath. No sick contacts. Was on antibiotics for bronchitis 4 weeks ago.  Patient is a 64 y.o. female presenting with abdominal pain. The history is provided by the patient and the spouse.  Abdominal Pain Associated symptoms: diarrhea and nausea     Past Medical History  Diagnosis Date  . Hypertension   . Mental disorder   . Depression   . Anxiety   . Cervical spondylosis     evaluated in the past by neurossurgeon  . Fibroids     in the past    Past Surgical History  Procedure Laterality Date  . Cholecystectomy    . Cesarean section     Family History  Problem Relation Age of Onset  . Other Mother      brain aneurysm  . Heart attack Father   . Hypertension Father   . Hyperlipidemia Father   . Hyperlipidemia Brother   . Gout Brother    History  Substance Use Topics  . Smoking status: Former Research scientist (life sciences)  . Smokeless tobacco: Not on file  . Alcohol Use: Yes     Comment: social   OB History    No data available     Review of Systems  Constitutional: Positive for appetite change.  Gastrointestinal: Positive for nausea, abdominal pain and diarrhea.  All other systems reviewed and are negative.     Allergies  Shellfish allergy; Statins; and Flagyl  Home Medications   Prior to Admission medications   Medication Sig Start Date End Date Taking? Authorizing Provider  aspirin 325 MG tablet Take 325 mg by mouth daily.    Historical Provider, MD  buPROPion (WELLBUTRIN XL) 150 MG 24 hr tablet Take 1 tablet (150 mg total) by mouth daily after breakfast. For depression 08/28/11 11/22/12  Greig Castilla, FNP  clonazePAM (KLONOPIN) 0.5 MG tablet Take 0.5 tablets (0.25 mg total) by mouth 3 (three) times daily at 8am, 2pm and bedtime. For anxiety. 08/28/11 11/22/12  Greig Castilla, FNP  escitalopram (LEXAPRO) 20 MG tablet Take 1 tablet (20 mg total) by mouth daily. For depression and anxiety. 08/28/11   Greig Castilla, FNP  ibuprofen (ADVIL,MOTRIN) 200 MG tablet Take 400 mg by mouth every 6 (six) hours as needed. For headache  Historical Provider, MD  irbesartan (AVAPRO) 150 MG tablet Take 150 mg by mouth daily.    Historical Provider, MD  lamoTRIgine (LAMICTAL) 25 MG tablet Take 50 mg by mouth daily.    Historical Provider, MD  Multiple Vitamin (MULTIVITAMIN WITH MINERALS) TABS Take 1 tablet by mouth daily.    Historical Provider, MD   BP 161/83 mmHg  Pulse 75  Temp(Src) 98.2 F (36.8 C) (Oral)  Resp 17  Wt 155 lb 12.8 oz (70.67 kg)  SpO2 97% Physical Exam  Constitutional: She is oriented to person, place, and time. She appears well-developed and well-nourished. No distress.  HENT:   Head: Normocephalic and atraumatic.  Mouth/Throat: Oropharynx is clear and moist.  Eyes: Conjunctivae and EOM are normal.  Neck: Normal range of motion. Neck supple.  Cardiovascular: Normal rate, regular rhythm and normal heart sounds.   Pulmonary/Chest: Effort normal and breath sounds normal. No respiratory distress.  Abdominal: Soft. Bowel sounds are normal. She exhibits no distension. There is no tenderness. There is no rebound and no guarding.  Musculoskeletal: Normal range of motion. She exhibits no edema.  Neurological: She is alert and oriented to person, place, and time. No sensory deficit.  Skin: Skin is warm and dry.  Psychiatric: She has a normal mood and affect. Her behavior is normal.  Nursing note and vitals reviewed.   ED Course  Procedures (including critical care time) Labs Review Labs Reviewed  COMPREHENSIVE METABOLIC PANEL - Abnormal; Notable for the following:    Glucose, Bld 106 (*)    AST 72 (*)    All other components within normal limits  LIPASE, BLOOD - Abnormal; Notable for the following:    Lipase 79 (*)    All other components within normal limits  URINALYSIS, ROUTINE W REFLEX MICROSCOPIC (NOT AT [facility]) - Abnormal; Notable for the following:    Hgb urine dipstick MODERATE (*)    All other components within normal limits  CBC  URINE MICROSCOPIC-ADD ON  I-STAT TROPOININ, ED    Imaging Review No results found.   EKG Interpretation   Date/Time:  Saturday November 10 2014 20:19:22 EDT Ventricular Rate:  79 PR Interval:  158 QRS Duration: 84 QT Interval:  382 QTC Calculation: 438 R Axis:   84 Text Interpretation:  Normal sinus rhythm Normal ECG Confirmed by HARRISON   MD, FORREST ([id number]) on 11/10/2014 10:16:00 PM      MDM   Final diagnoses:  RUQ abdominal pain  Nausea  Diarrhea   Nontoxic appearing, NAD. AF VSS. Abdomen is soft and nontender on my exam. On arrival to the ED, the patient's pain had resolved, given Zofran for nausea with  significant relief. Earlier in the day had diarrhea, raising possibility of a viral illness. Doubt diverticulitis, no bloody stool and no abdominal tenderness. History of cholecystectomy despite right upper quadrant pain. It is possible this was gas pains. Regarding the pain radiating into her chest, no associated chest pain. Cardiac workup negative. Patient's pain did not return in the emergency department. Given by mouth challenge without any return of patient's pain. Repeat abdominal exam completely benign. No further nausea. Advised the patient to follow-up with her PCP within 2-3 days. Stable for discharge. Return precautions given. Patient states understanding of treatment care plan and is agreeable.  Discussed with attending Dr. Aline Brochure who also evaluated patient and agrees with plan of care.   Carman Ching, PA-C 11/10/14 2325  Pamella Pert, MD 11/11/14 [phone]

## 2014-11-10 NOTE — ED Notes (Signed)
Shed does not have her gb

## 2014-11-10 NOTE — ED Notes (Signed)
The pt is c/o epigastric pain and pain up into her chestr 20 minutes ago with n v.  No previous history

## 2014-11-10 NOTE — Discharge Instructions (Signed)
Follow up with your primary care doctor in 2-3 days. Return with worsening symptoms.  Abdominal Pain Many things can cause abdominal pain. Usually, abdominal pain is not caused by a disease and will improve without treatment. It can often be observed and treated at home. Your health care provider will do a physical exam and possibly order blood tests and X-rays to help determine the seriousness of your pain. However, in many cases, more time must pass before a clear cause of the pain can be found. Before that point, your health care provider may not know if you need more testing or further treatment. HOME CARE INSTRUCTIONS  Monitor your abdominal pain for any changes. The following actions may help to alleviate any discomfort you are experiencing:  Only take over-the-counter or prescription medicines as directed by your health care provider.  Do not take laxatives unless directed to do so by your health care provider.  Try a clear liquid diet (broth, tea, or water) as directed by your health care provider. Slowly move to a bland diet as tolerated. SEEK MEDICAL CARE IF:  You have unexplained abdominal pain.  You have abdominal pain associated with nausea or diarrhea.  You have pain when you urinate or have a bowel movement.  You experience abdominal pain that wakes you in the night.  You have abdominal pain that is worsened or improved by eating food.  You have abdominal pain that is worsened with eating fatty foods.  You have a fever. SEEK IMMEDIATE MEDICAL CARE IF:   Your pain does not go away within 2 hours.  You keep throwing up (vomiting).  Your pain is felt only in portions of the abdomen, such as the right side or the left lower portion of the abdomen.  You pass bloody or black tarry stools. MAKE SURE YOU:  Understand these instructions.   Will watch your condition.   Will get help right away if you are not doing well or get worse.  Document Released: 02/11/2005  Document Revised: 05/09/2013 Document Reviewed: 01/11/2013 [facility] Patient Information 2015 [place], [place]. This information is not intended to replace advice given to you by your health care provider. Make sure you discuss any questions you have with your health care provider.  Food Choices to Help Relieve Diarrhea When you have diarrhea, the foods you eat and your eating habits are very important. Choosing the right foods and drinks can help relieve diarrhea. Also, because diarrhea can last up to 7 days, you need to replace lost fluids and electrolytes (such as sodium, potassium, and chloride) in order to help prevent dehydration.  WHAT GENERAL GUIDELINES DO I NEED TO FOLLOW?  Slowly drink 1 cup (8 oz) of fluid for each episode of diarrhea. If you are getting enough fluid, your urine will be clear or pale yellow.  Eat starchy foods. Some good choices include white rice, white toast, pasta, low-fiber cereal, baked potatoes (without the skin), saltine crackers, and bagels.  Avoid large servings of any cooked vegetables.  Limit fruit to two servings per day. A serving is  cup or 1 small piece.  Choose foods with less than 2 g of fiber per serving.  Limit fats to less than 8 tsp (38 g) per day.  Avoid fried foods.  Eat foods that have probiotics in them. Probiotics can be found in certain dairy products.  Avoid foods and beverages that may increase the speed at which food moves through the stomach and intestines (gastrointestinal tract). Things to avoid  include:  High-fiber foods, such as dried fruit, raw fruits and vegetables, nuts, seeds, and whole grain foods.  Spicy foods and high-fat foods.  Foods and beverages sweetened with high-fructose corn syrup, honey, or sugar alcohols such as xylitol, sorbitol, and mannitol. WHAT FOODS ARE RECOMMENDED? Grains White rice. White, [place], or pita breads (fresh or toasted), including plain rolls, buns, or bagels. White pasta. Saltine, soda,  or graham crackers. Pretzels. Low-fiber cereal. Cooked cereals made with water (such as cornmeal, farina, or cream cereals). Plain muffins. Matzo. Melba toast. Zwieback.  Vegetables Potatoes (without the skin). Strained tomato and vegetable juices. Most well-cooked and canned vegetables without seeds. Tender lettuce. Fruits Cooked or canned applesauce, apricots, cherries, fruit cocktail, grapefruit, peaches, pears, or plums. Fresh bananas, apples without skin, cherries, grapes, cantaloupe, grapefruit, peaches, oranges, or plums.  Meat and Other Protein Products Baked or boiled chicken. Eggs. Tofu. Fish. Seafood. Smooth peanut butter. Ground or well-cooked tender beef, ham, veal, lamb, pork, or poultry.  Dairy Plain yogurt, kefir, and unsweetened liquid yogurt. Lactose-free milk, buttermilk, or soy milk. Plain hard cheese. Beverages Sport drinks. Clear broths. Diluted fruit juices (except prune). Regular, caffeine-free sodas such as ginger ale. Water. Decaffeinated teas. Oral rehydration solutions. Sugar-free beverages not sweetened with sugar alcohols. Other Bouillon, broth, or soups made from recommended foods.  The items listed above may not be a complete list of recommended foods or beverages. Contact your dietitian for more options. WHAT FOODS ARE NOT RECOMMENDED? Grains Whole grain, whole wheat, bran, or rye breads, rolls, pastas, crackers, and cereals. Wild or brown rice. Cereals that contain more than 2 g of fiber per serving. Corn tortillas or taco shells. Cooked or dry oatmeal. Granola. Popcorn. Vegetables Raw vegetables. Cabbage, broccoli, Brussels sprouts, artichokes, baked beans, beet greens, corn, kale, legumes, peas, sweet potatoes, and yams. Potato skins. Cooked spinach and cabbage. Fruits Dried fruit, including raisins and dates. Raw fruits. Stewed or dried prunes. Fresh apples with skin, apricots, mangoes, pears, raspberries, and strawberries.  Meat and Other Protein  Products Chunky peanut butter. Nuts and seeds. Beans and lentils. Kayla Deleon.  Dairy High-fat cheeses. Milk, chocolate milk, and beverages made with milk, such as milk shakes. Cream. Ice cream. Sweets and Desserts Sweet rolls, doughnuts, and sweet breads. Pancakes and waffles. Fats and Oils Butter. Cream sauces. Margarine. Salad oils. Plain salad dressings. Olives. Avocados.  Beverages Caffeinated beverages (such as coffee, tea, soda, or energy drinks). Alcoholic beverages. Fruit juices with pulp. Prune juice. Soft drinks sweetened with high-fructose corn syrup or sugar alcohols. Other Coconut. Hot sauce. Chili powder. Mayonnaise. Gravy. Cream-based or milk-based soups.  The items listed above may not be a complete list of foods and beverages to avoid. Contact your dietitian for more information. WHAT SHOULD I DO IF I BECOME DEHYDRATED? Diarrhea can sometimes lead to dehydration. Signs of dehydration include dark urine and dry mouth and skin. If you think you are dehydrated, you should rehydrate with an oral rehydration solution. These solutions can be purchased at pharmacies, retail stores, or online.  Drink -1 cup (120-240 mL) of oral rehydration solution each time you have an episode of diarrhea. If drinking this amount makes your diarrhea worse, try drinking smaller amounts more often. For example, drink 1-3 tsp (5-15 mL) every 5-10 minutes.  A general rule for staying hydrated is to drink 1-2 L of fluid per day. Talk to your health care provider about the specific amount you should be drinking each day. Drink enough fluids to keep your urine clear  or pale yellow. Document Released: 07/25/2003 Document Revised: 05/09/2013 Document Reviewed: 03/27/2013 [facility] Patient Information 2015 [place], [place]. This information is not intended to replace advice given to you by your health care provider. Make sure you discuss any questions you have with your health care provider.

## 2014-11-10 NOTE — ED Notes (Signed)
MD at bedside. 

## 2015-02-01 ENCOUNTER — Other Ambulatory Visit: Payer: Self-pay | Admitting: Family Medicine

## 2015-02-01 ENCOUNTER — Other Ambulatory Visit (HOSPITAL_COMMUNITY)
Admission: RE | Admit: 2015-02-01 | Discharge: 2015-02-01 | Disposition: A | Discharge status: Home / Self Care | Payer: 59 | Source: Ambulatory Visit | Attending: Family Medicine | Admitting: Family Medicine

## 2015-02-01 DIAGNOSIS — Z1151 Encounter for screening for human papillomavirus (HPV): Secondary | ICD-10-CM | POA: Insufficient documentation

## 2015-02-01 DIAGNOSIS — Z124 Encounter for screening for malignant neoplasm of cervix: Secondary | ICD-10-CM | POA: Diagnosis present

## 2015-02-04 LAB — CYTOLOGY - PAP

## 2015-02-18 ENCOUNTER — Other Ambulatory Visit: Payer: Self-pay

## 2015-02-18 DIAGNOSIS — Z1231 Encounter for screening mammogram for malignant neoplasm of breast: Secondary | ICD-10-CM

## 2015-03-20 ENCOUNTER — Ambulatory Visit: Payer: Self-pay

## 2015-05-30 ENCOUNTER — Ambulatory Visit: Payer: Self-pay

## 2015-05-31 ENCOUNTER — Ambulatory Visit
Admission: RE | Admit: 2015-05-31 | Discharge: 2015-05-31 | Disposition: A | Discharge status: Home / Self Care | Payer: 59 | Source: Ambulatory Visit

## 2015-05-31 DIAGNOSIS — Z1231 Encounter for screening mammogram for malignant neoplasm of breast: Secondary | ICD-10-CM

## 2015-07-04 ENCOUNTER — Encounter: Payer: Self-pay | Admitting: Internal Medicine

## 2015-07-04 ENCOUNTER — Ambulatory Visit (INDEPENDENT_AMBULATORY_CARE_PROVIDER_SITE_OTHER): Payer: 59 | Admitting: Internal Medicine

## 2015-07-04 VITALS — BP 138/78 | HR 89 | Ht 65.0 in | Wt 154.2 lb

## 2015-07-04 DIAGNOSIS — R059 Cough, unspecified: Secondary | ICD-10-CM

## 2015-07-04 DIAGNOSIS — R05 Cough: Secondary | ICD-10-CM

## 2015-07-04 NOTE — Progress Notes (Signed)
Subjective:    Patient ID: Kayla Deleon, female    DOB: 12/28/1950, 65 y.o.   MRN: [id number] PCP Gennette Pac, MD  HPI  IOV 07/04/2015  Chief Complaint  Patient presents with  . Pulmonary Consult    Pt referred by Dr. Rex Kras for abnormal CXR. Pt denies SOB, cough, and CP/tightness. Pt states her breathing is well.       65 year old almost never smoker fairly healthy without any respiratory complaints at baseline. Early January 2017 her husband had "head cold" after which she developed acute cough. She saw primary care physician and apparently got a chest x-ray which I personally visualized and it looks clear to me. However the radiologist felt dose of interstitial prominence in the pulmonary edema or interstitial lung disease and unchanged from 2014. However chest x-ray 11/22/2012 which I also visualized and looks normal to me and the radiologist at that time felt the chest x-ray was normal. Currently her cough is almost resolved. She does not have any baseline dyspnea or even now dyspnea. There are no other respiratory issues. There is no orthopnea or paroxysmal nocturnal dyspnea or chest pain or palpitations. She is wondering if she needs a high-resolution CT chest or even cardiac evaluation. There is no autoimmune symptoms   has a past medical history of Hypertension; Mental disorder; Depression; Anxiety; Cervical spondylosis; Fibroids; Hypercholesteremia; and Sleep apnea.   reports that she quit smoking about 40 years ago. Her smoking use included Cigarettes. She has a .1 pack-year smoking history. She has never used smokeless tobacco.  Past Surgical History  Procedure Laterality Date  . Cholecystectomy    . Cesarean section      Allergies  Allergen Reactions  . Shellfish Allergy Anaphylaxis    Pt. Uses Epipen.  . Statins Other (See Comments)    unknown  . Flagyl [Metronidazole Hcl] Rash    Immunization History  Administered Date(s) Administered  . Influenza,inj,Quad  PF,36+ Mos 01/17/2015    Family History  Problem Relation Age of Onset  . Other Mother     brain aneurysm  . Heart attack Father   . Hypertension Father   . Hyperlipidemia Father   . Hyperlipidemia Brother   . Gout Brother      Current outpatient prescriptions:  .  Cholecalciferol (VITAMIN D) 2000 units CAPS, Take by mouth daily., Disp: , Rfl:  .  escitalopram (LEXAPRO) 20 MG tablet, Take 1 tablet (20 mg total) by mouth daily. For depression and anxiety., Disp: 30 tablet, Rfl: 0 .  ibuprofen (ADVIL,MOTRIN) 200 MG tablet, Take 400 mg by mouth every 6 (six) hours as needed. For headache, Disp: , Rfl:  .  lamoTRIgine (LAMICTAL) 25 MG tablet, Take 50 mg by mouth daily., Disp: , Rfl:  .  Multiple Vitamin (MULTIVITAMIN WITH MINERALS) TABS, Take 1 tablet by mouth daily., Disp: , Rfl:  .  Multiple Vitamins-Minerals (MULTIVITAMIN WITH MINERALS) tablet, Take 1 tablet by mouth daily., Disp: , Rfl:  .  Omega-3 Fatty Acids (FISH OIL) 1200 MG CAPS, Take 3 capsules by mouth daily., Disp: , Rfl:  .  vitamin E 100 UNIT capsule, Take by mouth daily., Disp: , Rfl:  .  buPROPion (WELLBUTRIN XL) 150 MG 24 hr tablet, Take 1 tablet (150 mg total) by mouth daily after breakfast. For depression, Disp: 30 tablet, Rfl: 0     Review of Systems  Constitutional: Negative for fever and unexpected weight change.  HENT: Negative for congestion, dental problem, ear pain, nosebleeds, postnasal drip,  rhinorrhea, sinus pressure, sneezing, sore throat and trouble swallowing.   Eyes: Negative for redness and itching.  Respiratory: Negative for cough, chest tightness, shortness of breath and wheezing.   Cardiovascular: Negative for palpitations and leg swelling.  Gastrointestinal: Negative for nausea and vomiting.  Genitourinary: Negative for dysuria.  Musculoskeletal: Negative for joint swelling.  Skin: Negative for rash.  Neurological: Negative for headaches.  Hematological: Does not bruise/bleed easily.    Psychiatric/Behavioral: Negative for dysphoric mood. The patient is not nervous/anxious.        Objective:   Physical Exam  Constitutional: She is oriented to person, place, and time. She appears well-developed and well-nourished. No distress.  HENT:  Head: Normocephalic and atraumatic.  Right Ear: External ear normal.  Left Ear: External ear normal.  Mouth/Throat: Oropharynx is clear and moist. No oropharyngeal exudate.  Eyes: Conjunctivae and EOM are normal. Pupils are equal, round, and reactive to light. Right eye exhibits no discharge. Left eye exhibits no discharge. No scleral icterus.  Neck: Normal range of motion. Neck supple. No JVD present. No tracheal deviation present. No thyromegaly present.  Cardiovascular: Normal rate, regular rhythm, normal heart sounds and intact distal pulses.  Exam reveals no gallop and no friction rub.   No murmur heard. Pulmonary/Chest: Effort normal and breath sounds normal. No respiratory distress. She has no wheezes. She has no rales. She exhibits no tenderness.  Abdominal: Soft. Bowel sounds are normal. She exhibits no distension and no mass. There is no tenderness. There is no rebound and no guarding.  Musculoskeletal: Normal range of motion. She exhibits no edema or tenderness.  Lymphadenopathy:    She has no cervical adenopathy.  Neurological: She is alert and oriented to person, place, and time. She has normal reflexes. No cranial nerve deficit. She exhibits normal muscle tone. Coordination normal.  Skin: Skin is warm and dry. No rash noted. She is not diaphoretic. No erythema. No pallor.  Psychiatric: She has a normal mood and affect. Her behavior is normal. Judgment and thought content normal.  Vitals reviewed.   Filed Vitals:   07/04/15 1444  BP: 138/78  Pulse: 89  Height: 5\' 5"  (1.651 m)  Weight: 154 lb 3.2 oz (69.945 kg)  SpO2: 97%         Assessment & Plan:     ICD-9-CM ICD-10-CM   1. Cough 786.2 R05      I think she  had postviral reactive cough or acute bronchitis. This is now on its way out. The probability of her having interstitial lung disease is very low based on baseline normal history and normal physical exam and what to me looks like a clear chest x-ray. However radiologist differs and feels that is mild interstitial prominence. Therefore if she does have interstitial lung disease it is early/ mild in the only way we can discern this is to get a high-resolution CT chest without contrast. This exposes her to some radiation risk. At this point I will get a second opinion for thoracic radiologist and based on his opinion on the x-ray we'll call her back and decide if we need to do a high-resolution CT chest   She is agreeable with the plan   Dr. Brand Males, M.D., [facility].C.P Pulmonary and Critical Care Medicine Staff Physician [place] [facility] Pager: [phone], If no answer or between  15:00h - 7:00h: call [phone]  07/04/2015 3:19 PM

## 2015-07-04 NOTE — Patient Instructions (Signed)
ICD-9-CM ICD-10-CM   1. Cough 786.2 R05     I think you're had acute vital bronchitis that is now on its way out   You are otherwise asymptomatic anti-exam is normal so I doubt you have interstitial lung disease or heart failure   I am going to get a second opinion on his chest x-ray from her thoracic radiologist and let you know if we should get a high-resolution CT chest rule out interstitial lung disease   Please call us back on [phone] if you do not hear from [place] within the next few days

## 2015-07-24 ENCOUNTER — Telehealth: Payer: Self-pay | Admitting: Internal Medicine

## 2015-07-24 NOTE — Telephone Encounter (Signed)
Please pass a 1000 apologies to patient JEREMI HAUGAN. I thought I had sent a phone note on this but not sure how I missed. Let her know that per specialist thoracic radiologist cxr is fine. If she is asymptomatic, then just give her appt in 1 year to make sure nothing is wrong. If she is currently symptomatic then do PFT and return to see me  Thanks  Dr. Brand Males, M.D., [facility].C.P Pulmonary and Critical Care Medicine Staff Physician [place] [facility] Pager: [phone], If no answer or between  15:00h - 7:00h: call [phone]  07/24/2015 2:59 PM      .............Marland Kitchen email from Dr Polly Cobia 07/04/15 Hi Florentina Marquart,   I think her heart is normal size and stable. I don't think there is evidence of pulmonary edema. I don't think an echo is necessary based on this CXR.   The interstitial markings appear upper normal in prominence to me. I probably would have called this a normal CXR. If she is asymptomatic I would probably just follow her up in clinic. If there are PFT abnormalities or if her symptoms persist/worsen, then an HRCT may be useful, but I think this is probably a normal CXR.   Corene Cornea

## 2015-07-24 NOTE — Telephone Encounter (Signed)
Called and spoke to pt. Pt states she is calling because she has not heard from MR regarding second option on CXR. Pt stated her cough has improved since last OV but has not completed resolved - FYI for MR.   1. Cough 786.2 R05    I think you're had acute vital bronchitis that is now on its way out  You are otherwise asymptomatic anti-exam is normal so I doubt you have interstitial lung disease or heart failure  I am going to get a second opinion on his chest x-ray from her thoracic radiologist and let you know if we should get a high-resolution CT chest rule out interstitial lung disease  Please call us back on [phone] if you do not hear from [place] within the next few days   MR please advise. Thanks.

## 2015-07-24 NOTE — Telephone Encounter (Signed)
Called and spoke to pt and relayed the apologies, pt very understanding. Informed her of the recs per MR. Pt verbalized understanding and stated she would f/u in 1 year. Recall placed. Pt verbalized understanding and denied any further questions or concerns at this time.

## 2015-10-26 ENCOUNTER — Emergency Department (HOSPITAL_COMMUNITY): Payer: 59

## 2015-10-26 ENCOUNTER — Emergency Department (HOSPITAL_COMMUNITY)
Admission: EM | Admit: 2015-10-26 | Discharge: 2015-10-26 | Disposition: A | Discharge status: Home / Self Care | Payer: 59 | Attending: Emergency Medicine | Admitting: Emergency Medicine

## 2015-10-26 ENCOUNTER — Encounter (HOSPITAL_COMMUNITY): Payer: Self-pay | Admitting: Emergency Medicine

## 2015-10-26 DIAGNOSIS — Z7982 Long term (current) use of aspirin: Secondary | ICD-10-CM | POA: Diagnosis not present

## 2015-10-26 DIAGNOSIS — R079 Chest pain, unspecified: Secondary | ICD-10-CM | POA: Insufficient documentation

## 2015-10-26 DIAGNOSIS — Z87891 Personal history of nicotine dependence: Secondary | ICD-10-CM | POA: Insufficient documentation

## 2015-10-26 DIAGNOSIS — E7800 Pure hypercholesterolemia, unspecified: Secondary | ICD-10-CM | POA: Insufficient documentation

## 2015-10-26 DIAGNOSIS — I10 Essential (primary) hypertension: Secondary | ICD-10-CM | POA: Insufficient documentation

## 2015-10-26 DIAGNOSIS — F329 Major depressive disorder, single episode, unspecified: Secondary | ICD-10-CM | POA: Insufficient documentation

## 2015-10-26 LAB — BASIC METABOLIC PANEL
Anion gap: 9 (ref 5–15)
BUN: 25 mg/dL — AB (ref 6–20)
CO2: 24 mmol/L (ref 22–32)
CREATININE: 0.9 mg/dL (ref 0.44–1.00)
Calcium: 9.3 mg/dL (ref 8.9–10.3)
Chloride: 105 mmol/L (ref 101–111)
GFR calc Af Amer: >60 mL/min (ref >60)
Glucose, Bld: 150 mg/dL — ABNORMAL HIGH (ref 65–99)
Potassium: 3.6 mmol/L (ref 3.5–5.1)
SODIUM: 138 mmol/L (ref 135–145)

## 2015-10-26 LAB — I-STAT TROPONIN, ED
Troponin i, poc: 0 ng/mL (ref 0.00–0.08)
Troponin i, poc: 0 ng/mL (ref 0.00–0.08)

## 2015-10-26 LAB — CBC
HCT: 40 % (ref 36.0–46.0)
Hemoglobin: 13.6 g/dL (ref 12.0–15.0)
MCH: 30.3 pg (ref 26.0–34.0)
MCHC: 34 g/dL (ref 30.0–36.0)
MCV: 89.1 fL (ref 78.0–100.0)
PLATELETS: 259 10*3/uL (ref 150–400)
RBC: 4.49 MIL/uL (ref 3.87–5.11)
RDW: 12.8 % (ref 11.5–15.5)
WBC: 8.1 10*3/uL (ref 4.0–10.5)

## 2015-10-26 LAB — D-DIMER, QUANTITATIVE (NOT AT ARMC): D DIMER QUANT: 0.3 ug{FEU}/mL (ref 0.00–0.50)

## 2015-10-26 NOTE — ED Provider Notes (Signed)
CSN: [id number]     Arrival date & time 10/26/15  [id number] History   First MD Initiated Contact with Patient 10/26/15 [phone]     Chief Complaint  Patient presents with  . Chest Pain   Patient is a 65 y.o. female presenting with chest pain. The history is provided by the patient.  Chest Pain Pain location:  L chest and R chest Pain quality comment:  Intense, sharp Pain radiates to:  Does not radiate Pain severity:  Severe Onset quality:  Sudden Duration:  20 minutes Progression:  Resolved Chronicity:  New Context comment:  Pt was in bed at the time Exacerbated by: nothing that she noticed. Associated symptoms: no abdominal pain, no cough, no fever, no nausea, no shortness of breath and not vomiting   Risk factors: no coronary artery disease, no diabetes mellitus, no high cholesterol, no hypertension, no prior DVT/PE and no smoking   Father deceased MI in his 52s  Past Medical History  Diagnosis Date  . Hypertension   . Mental disorder   . Depression   . Anxiety   . Cervical spondylosis     evaluated in the past by [facility]  . Fibroids     in the past   . Hypercholesteremia   . Sleep apnea     on CPAP   Past Surgical History  Procedure Laterality Date  . Cholecystectomy    . Cesarean section     Family History  Problem Relation Age of Onset  . Other Mother     brain aneurysm  . Heart attack Father   . Hypertension Father   . Hyperlipidemia Father   . Hyperlipidemia Brother   . Gout Brother    Social History  Substance Use Topics  . Smoking status: Former Smoker -- 0.10 packs/day for 1 years    Types: Cigarettes    Quit date: 05/19/1975  . Smokeless tobacco: Never Used     Comment: Smoked in college- occasional/social   . Alcohol Use: 0.0 oz/week    0 Standard drinks or equivalent per week     Comment: social   OB History    No data available     Review of Systems  Constitutional: Negative for fever.  Respiratory: Negative for cough and shortness of  breath.   Cardiovascular: Positive for chest pain.  Gastrointestinal: Negative for nausea, vomiting and abdominal pain.  All other systems reviewed and are negative.     Allergies  Shellfish allergy; Statins; and Flagyl  Home Medications   Prior to Admission medications   Medication Sig Start Date End Date Taking? Authorizing Provider  aspirin 81 MG tablet Take 81-162 mg by mouth daily as needed for pain.   Yes Historical Provider, MD  buPROPion (WELLBUTRIN SR) 200 MG 12 hr tablet Take 200 mg by mouth daily. 10/19/15  Yes Historical Provider, MD  Cholecalciferol (VITAMIN D) 2000 units CAPS Take 2,000 Units by mouth daily.    Yes Historical Provider, MD  escitalopram (LEXAPRO) 20 MG tablet Take 1 tablet (20 mg total) by mouth daily. For depression and anxiety. 08/28/11  Yes Greig Castilla, FNP  ibuprofen (ADVIL,MOTRIN) 200 MG tablet Take 400 mg by mouth every 6 (six) hours as needed. For headache   Yes Historical Provider, MD  lamoTRIgine (LAMICTAL) 25 MG tablet Take 50 mg by mouth daily.   Yes Historical Provider, MD  Multiple Vitamin (MULTIVITAMIN WITH MINERALS) TABS Take 1 tablet by mouth daily.   Yes Historical Provider, MD  Omega-3  Fatty Acids (FISH OIL) 1200 MG CAPS Take 3 capsules by mouth daily.   Yes Historical Provider, MD  vitamin E 100 UNIT capsule Take 100 Units by mouth daily.    Yes Historical Provider, MD  buPROPion (WELLBUTRIN XL) 150 MG 24 hr tablet Take 1 tablet (150 mg total) by mouth daily after breakfast. For depression Patient not taking: Reported on 10/26/2015 08/28/11 10/26/15  Greig Castilla, FNP  CLODERM 0.1 % cream Apply 1 application topically 2 (two) times daily as needed. Reported on 10/26/2015 10/25/15   Historical Provider, MD   BP 146/85 mmHg  Pulse 78  Temp(Src) 98.8 F (37.1 C) (Oral)  Resp 18  SpO2 97% Physical Exam  Constitutional: She appears well-developed and well-nourished. No distress.  HENT:  Head: Normocephalic and atraumatic.  Right Ear:  External ear normal.  Left Ear: External ear normal.  Eyes: Conjunctivae are normal. Right eye exhibits no discharge. Left eye exhibits no discharge. No scleral icterus.  Neck: Neck supple. No tracheal deviation present.  Cardiovascular: Normal rate, regular rhythm and intact distal pulses.   Pulmonary/Chest: Effort normal and breath sounds normal. No stridor. No respiratory distress. She has no wheezes. She has no rales.  Abdominal: Soft. Bowel sounds are normal. She exhibits no distension. There is no tenderness. There is no rebound and no guarding.  Musculoskeletal: She exhibits no edema or tenderness.  Neurological: She is alert. She has normal strength. No cranial nerve deficit (no facial droop, extraocular movements intact, no slurred speech) or sensory deficit. She exhibits normal muscle tone. She displays no seizure activity. Coordination normal.  Skin: Skin is warm and dry. No rash noted.  Psychiatric: She has a normal mood and affect.  Nursing note and vitals reviewed.   ED Course  Procedures (including critical care time) Labs Review Labs Reviewed  BASIC METABOLIC PANEL - Abnormal; Notable for the following:    Glucose, Bld 150 (*)    BUN 25 (*)    All other components within normal limits  CBC  D-DIMER, QUANTITATIVE (NOT AT [facility])  I-STAT TROPOININ, ED  I-STAT TROPOININ, ED    Imaging Review Dg Chest 2 View  10/26/2015  CLINICAL DATA:  Upper chest pain last night; pt states that she had same episode about 5 years ago; hx bronchitis; ex smoker; EXAM: CHEST - 2 VIEW COMPARISON:  06/13/2015 FINDINGS: Lungs are clear, mildly hyperinflated. Heart size and mediastinal contours are within normal limits. No effusion.  No pneumothorax. Visualized bones unremarkable.   Cholecystectomy clips. IMPRESSION: No acute cardiopulmonary disease. Electronically Signed   By: Lucrezia Europe M.D.   On: 10/26/2015 09:42   I have personally reviewed and evaluated these images and lab results as part of  my medical decision-making.   EKG Interpretation   Date/Time:  Saturday October 26 2015 09:08:49 EDT Ventricular Rate:  83 PR Interval:  162 QRS Duration: 90 QT Interval:  384 QTC Calculation: 451 R Axis:   85 Text Interpretation:  Sinus rhythm Anterior infarct, old Nonspecific T  abnormalities, lateral leads decreased t wave amplitude in lateral leads  compared to last tracing Confirmed by Jakyrie Totherow  MD-J, Briza Bark [id number]) on  10/26/2015 10:04:18 AM      MDM   Final diagnoses:  Chest pain, unspecified chest pain type    Pt has no history of CAD.  She states she had a negative stress test a few years ago.  ED workup is reassuring.  Heart Score is 1.  Low risk for major adverse  cardiac event.  Doubt ACS as the cause of her sx.  Doubt PE.  No pta or widened mediastinum.  Pt feels comfortable with discharge and outpatient follow up with her cardiologist.   Dorie Rank, MD 10/26/15 1356

## 2015-10-26 NOTE — ED Notes (Signed)
Pt c/o intense chest pain that lasted about 20 minutes last night, then went away. States she no longer feels any chest pain, did not have any SOB during that episode. States "I think I'm doing too much" to her husband while in triage.

## 2015-10-26 NOTE — ED Notes (Addendum)
Patient states chest pain has resolved and is resting comfortably.

## 2015-10-26 NOTE — Discharge Instructions (Signed)
Nonspecific Chest Pain  °Chest pain can be caused by many different conditions. There is always a chance that your pain could be related to something serious, such as a heart attack or a blood clot in your lungs. Chest pain can also be caused by conditions that are not life-threatening. If you have chest pain, it is very important to follow up with your health care provider. °CAUSES  °Chest pain can be caused by: °· Heartburn. °· Pneumonia or bronchitis. °· Anxiety or stress. °· Inflammation around your heart (pericarditis) or lung (pleuritis or pleurisy). °· A blood clot in your lung. °· A collapsed lung (pneumothorax). It can develop suddenly on its own (spontaneous pneumothorax) or from trauma to the chest. °· Shingles infection (varicella-zoster virus). °· Heart attack. °· Damage to the bones, muscles, and cartilage that make up your chest wall. This can include: °¨ Bruised bones due to injury. °¨ Strained muscles or cartilage due to frequent or repeated coughing or overwork. °¨ Fracture to one or more ribs. °¨ Sore cartilage due to inflammation (costochondritis). °RISK FACTORS  °Risk factors for chest pain may include: °· Activities that increase your risk for trauma or injury to your chest. °· Respiratory infections or conditions that cause frequent coughing. °· Medical conditions or overeating that can cause heartburn. °· Heart disease or family history of heart disease. °· Conditions or health behaviors that increase your risk of developing a blood clot. °· Having had chicken pox (varicella zoster). °SIGNS AND SYMPTOMS °Chest pain can feel like: °· Burning or tingling on the surface of your chest or deep in your chest. °· Crushing, pressure, aching, or squeezing pain. °· Dull or sharp pain that is worse when you move, cough, or take a deep breath. °· Pain that is also felt in your back, neck, shoulder, or arm, or pain that spreads to any of these areas. °Your chest pain may come and go, or it may stay  constant. °DIAGNOSIS °Lab tests or other studies may be needed to find the cause of your pain. Your health care provider may have you take a test called an ambulatory ECG (electrocardiogram). An ECG records your heartbeat patterns at the time the test is performed. You may also have other tests, such as: °· Transthoracic echocardiogram (TTE). During echocardiography, sound waves are used to create a picture of all of the heart structures and to look at how blood flows through your heart. °· Transesophageal echocardiogram (TEE). This is a more advanced imaging test that obtains images from inside your body. It allows your health care provider to see your heart in finer detail. °· Cardiac monitoring. This allows your health care provider to monitor your heart rate and rhythm in real time. °· Holter monitor. This is a portable device that records your heartbeat and can help to diagnose abnormal heartbeats. It allows your health care provider to track your heart activity for several days, if needed. °· Stress tests. These can be done through exercise or by taking medicine that makes your heart beat more quickly. °· Blood tests. °· Imaging tests. °TREATMENT  °Your treatment depends on what is causing your chest pain. Treatment may include: °· Medicines. These may include: °¨ Acid blockers for heartburn. °¨ Anti-inflammatory medicine. °¨ Pain medicine for inflammatory conditions. °¨ Antibiotic medicine, if an infection is present. °¨ Medicines to dissolve blood clots. °¨ Medicines to treat coronary artery disease. °· Supportive care for conditions that do not require medicines. This may include: °¨ Resting. °¨ Applying heat   or cold packs to injured areas. °¨ Limiting activities until pain decreases. °HOME CARE INSTRUCTIONS °· If you were prescribed an antibiotic medicine, finish it all even if you start to feel better. °· Avoid any activities that bring on chest pain. °· Do not use any tobacco products, including  cigarettes, chewing tobacco, or electronic cigarettes. If you need help quitting, ask your health care provider. °· Do not drink alcohol. °· Take medicines only as directed by your health care provider. °· Keep all follow-up visits as directed by your health care provider. This is important. This includes any further testing if your chest pain does not go away. °· If heartburn is the cause for your chest pain, you may be told to keep your head raised (elevated) while sleeping. This reduces the chance that acid will go from your stomach into your esophagus. °· Make lifestyle changes as directed by your health care provider. These may include: °¨ Getting regular exercise. Ask your health care provider to suggest some activities that are safe for you. °¨ Eating a heart-healthy diet. A registered dietitian can help you to learn healthy eating options. °¨ Maintaining a healthy weight. °¨ Managing diabetes, if necessary. °¨ Reducing stress. °SEEK MEDICAL CARE IF: °· Your chest pain does not go away after treatment. °· You have a rash with blisters on your chest. °· You have a fever. °SEEK IMMEDIATE MEDICAL CARE IF:  °· Your chest pain is worse. °· You have an increasing cough, or you cough up blood. °· You have severe abdominal pain. °· You have severe weakness. °· You faint. °· You have chills. °· You have sudden, unexplained chest discomfort. °· You have sudden, unexplained discomfort in your arms, back, neck, or jaw. °· You have shortness of breath at any time. °· You suddenly start to sweat, or your skin gets clammy. °· You feel nauseous or you vomit. °· You suddenly feel light-headed or dizzy. °· Your heart begins to beat quickly, or it feels like it is skipping beats. °These symptoms may represent a serious problem that is an emergency. Do not wait to see if the symptoms will go away. Get medical help right away. Call your local emergency services (911 in the [place] Do not drive yourself to the hospital. °  °This  information is not intended to replace advice given to you by your health care provider. Make sure you discuss any questions you have with your health care provider. °  °Document Released: 02/11/2005 Document Revised: 05/25/2014 Document Reviewed: 12/08/2013 °Elsevier Interactive Patient Education ©2016 [employer] ° °

## 2016-03-30 LAB — HM COLONOSCOPY

## 2016-09-07 ENCOUNTER — Other Ambulatory Visit: Payer: Self-pay | Admitting: Family Medicine

## 2016-09-07 DIAGNOSIS — Z1231 Encounter for screening mammogram for malignant neoplasm of breast: Secondary | ICD-10-CM

## 2016-09-28 ENCOUNTER — Ambulatory Visit
Admission: RE | Admit: 2016-09-28 | Discharge: 2016-09-28 | Disposition: A | Discharge status: Home / Self Care | Payer: 59 | Source: Ambulatory Visit | Attending: Family Medicine | Admitting: Family Medicine

## 2016-09-28 DIAGNOSIS — Z1231 Encounter for screening mammogram for malignant neoplasm of breast: Secondary | ICD-10-CM

## 2016-10-14 ENCOUNTER — Ambulatory Visit: Payer: Self-pay | Admitting: Internal Medicine

## 2016-12-23 ENCOUNTER — Ambulatory Visit: Payer: Self-pay | Admitting: Internal Medicine

## 2017-02-15 ENCOUNTER — Encounter: Payer: Self-pay | Admitting: Internal Medicine

## 2017-02-15 ENCOUNTER — Ambulatory Visit (INDEPENDENT_AMBULATORY_CARE_PROVIDER_SITE_OTHER): Payer: 59 | Admitting: Internal Medicine

## 2017-02-15 VITALS — BP 126/72 | HR 72 | Ht 65.0 in | Wt 133.0 lb

## 2017-02-15 DIAGNOSIS — R936 Abnormal findings on diagnostic imaging of limbs: Secondary | ICD-10-CM

## 2017-02-15 DIAGNOSIS — Z0000 Encounter for general adult medical examination without abnormal findings: Secondary | ICD-10-CM

## 2017-02-15 NOTE — Patient Instructions (Signed)
ICD-10-CM   1. Well adult exam Z00.00     Cough resolved  Lung exam normal  Plan  followup as needed

## 2017-02-15 NOTE — Progress Notes (Signed)
Subjective:     Patient ID: Kayla Deleon, female   DOB: Aug 27, 1950, 66 y.o.   MRN: [id number]  HPI  PCP Gennette Pac, MD  HPI  IOV 07/04/2015  Chief Complaint  Patient presents with  . Pulmonary Consult    Pt referred by Dr. Rex Kras for abnormal CXR. Pt denies SOB, cough, and CP/tightness. Pt states her breathing is well.       66 year old almost never smoker fairly healthy without any respiratory complaints at baseline. Early January 2017 her husband had "head cold" after which she developed acute cough. She saw primary care physician and apparently got a chest x-ray which I personally visualized and it looks clear to me. However the radiologist felt dose of interstitial prominence in the pulmonary edema or interstitial lung disease and unchanged from 2014. However chest x-ray 11/22/2012 which I also visualized and looks normal to me and the radiologist at that time felt the chest x-ray was normal. Currently her cough is almost resolved. She does not have any baseline dyspnea or even now dyspnea. There are no other respiratory issues. There is no orthopnea or paroxysmal nocturnal dyspnea or chest pain or palpitations. She is wondering if she needs a high-resolution CT chest or even cardiac evaluation. There is no autoimmune symptoms   has a past medical history of Hypertension; Mental disorder; Depression; Anxiety; Cervical spondylosis; Fibroids; Hypercholesteremia; and Sleep apnea.   reports that she quit smoking about 40 years ago. Her smoking use included Cigarettes. She has a .1 pack-year smoking history. She has never used smokeless tobacco.   OV 02/15/2017  Chief Complaint  Patient presents with  . Follow-up    Pt states that she has been doing very well. Denies any cough, SOB, or CP.    Follow-up chronic cough  I saw her 18 months ago for chronic cough which is already resolving. It was due to postviral reactive cough. The chest x-ray read by the first radiologist suggested  interstitial prominence. I personally thought it was clear. A second etiology opinion was obtained and they felt it was clear. Patient and I discussed and we agreed that we would however one year follow-up. She presents for this one year follow-up although it is at 1-1/2 years. She says she is doing really well. She is asymptomatic. She's had a flu shot    has a past medical history of Anxiety; Cervical spondylosis; Depression; Fibroids; Hypercholesteremia; Hypertension; Mental disorder; and Sleep apnea.   reports that she quit smoking about 41 years ago. Her smoking use included Cigarettes. She has a 0.10 pack-year smoking history. She has never used smokeless tobacco.  Past Surgical History:  Procedure Laterality Date  . CESAREAN SECTION    . CHOLECYSTECTOMY      Allergies  Allergen Reactions  . Shellfish Allergy Anaphylaxis    Pt. Uses Epipen.  . Statins Other (See Comments)    Severe muscle pain  . Flagyl [Metronidazole Hcl] Rash    Immunization History  Administered Date(s) Administered  . Influenza,inj,Quad PF,6+ Mos 01/17/2015  . Influenza-Unspecified 01/25/2017    Family History  Problem Relation Age of Onset  . Other Mother        brain aneurysm  . Heart attack Father   . Hypertension Father   . Hyperlipidemia Father   . Hyperlipidemia Brother   . Gout Brother      Current Outpatient Prescriptions:  .  buPROPion (WELLBUTRIN SR) 200 MG 12 hr tablet, Take 200 mg by mouth daily., Disp: ,  Rfl:  .  Cholecalciferol (VITAMIN D) 2000 units CAPS, Take 2,000 Units by mouth daily. , Disp: , Rfl:  .  CLODERM 0.1 % cream, Apply 1 application topically 2 (two) times daily as needed. Reported on 10/26/2015, Disp: , Rfl:  .  escitalopram (LEXAPRO) 20 MG tablet, Take 1 tablet (20 mg total) by mouth daily. For depression and anxiety., Disp: 30 tablet, Rfl: 0 .  ibuprofen (ADVIL,MOTRIN) 200 MG tablet, Take 400 mg by mouth every 6 (six) hours as needed. For headache, Disp: , Rfl:  .   lamoTRIgine (LAMICTAL) 25 MG tablet, Take 50 mg by mouth daily., Disp: , Rfl:  .  Multiple Vitamin (MULTIVITAMIN WITH MINERALS) TABS, Take 1 tablet by mouth daily., Disp: , Rfl:  .  Omega-3 Fatty Acids (FISH OIL) 1200 MG CAPS, Take 3 capsules by mouth daily., Disp: , Rfl:  .  vitamin E 100 UNIT capsule, Take 100 Units by mouth daily. , Disp: , Rfl:    Review of Systems     Objective:   Physical Exam  Constitutional: She is oriented to person, place, and time. She appears well-developed and well-nourished. No distress.  HENT:  Head: Normocephalic and atraumatic.  Right Ear: External ear normal.  Left Ear: External ear normal.  Mouth/Throat: Oropharynx is clear and moist. No oropharyngeal exudate.  Eyes: Pupils are equal, round, and reactive to light. Conjunctivae and EOM are normal. Right eye exhibits no discharge. Left eye exhibits no discharge. No scleral icterus.  Neck: Normal range of motion. Neck supple. No JVD present. No tracheal deviation present. No thyromegaly present.  Cardiovascular: Normal rate, regular rhythm, normal heart sounds and intact distal pulses.  Exam reveals no gallop and no friction rub.   No murmur heard. Pulmonary/Chest: Effort normal and breath sounds normal. No respiratory distress. She has no wheezes. She has no rales. She exhibits no tenderness.  Abdominal: Soft. Bowel sounds are normal. She exhibits no distension and no mass. There is no tenderness. There is no rebound and no guarding.  Musculoskeletal: Normal range of motion. She exhibits no edema or tenderness.  Lymphadenopathy:    She has no cervical adenopathy.  Neurological: She is alert and oriented to person, place, and time. She has normal reflexes. No cranial nerve deficit. She exhibits normal muscle tone. Coordination normal.  Skin: Skin is warm and dry. No rash noted. She is not diaphoretic. No erythema. No pallor.  Psychiatric: She has a normal mood and affect. Her behavior is normal. Judgment  and thought content normal.  Vitals reviewed.  Vitals:   02/15/17 1207  BP: 126/72  Pulse: 72  SpO2: 96%  Weight: 133 lb (60.3 kg)  Height: 5\' 5"  (1.651 m)    Estimated body mass index is 22.13 kg/m as calculated from the following:   Height as of this encounter: 5\' 5"  (1.651 m).   Weight as of this encounter: 133 lb (60.3 kg).     Assessment:       ICD-10-CM   1. Well adult exam Z00.00        Plan:     Prn followup  Dr. Brand Males, M.D., [facility].C.P Pulmonary and Critical Care Medicine Staff Physician [facility] Pager: [phone], If no answer or between  15:00h - 7:00h: call [phone]  02/15/2017 12:34 PM

## 2017-04-15 ENCOUNTER — Encounter: Payer: Self-pay | Admitting: Neurology

## 2017-04-15 ENCOUNTER — Ambulatory Visit (INDEPENDENT_AMBULATORY_CARE_PROVIDER_SITE_OTHER): Payer: 59 | Admitting: Neurology

## 2017-04-15 VITALS — BP 131/80 | HR 65 | Ht 65.0 in | Wt 136.6 lb

## 2017-04-15 DIAGNOSIS — G609 Hereditary and idiopathic neuropathy, unspecified: Secondary | ICD-10-CM

## 2017-04-15 NOTE — Progress Notes (Signed)
[facility]    Provider:  Dr Jaynee Eagles Referring Provider: Hulan Fess, MD Primary Care Physician:  Hulan Fess, MD  CC:  Paresthesias  HPI:  Kayla Deleon is a 66 y.o. female here as a referral from Dr. Rex Kras for .  Past medical history of depression, hyperlipidemia, sleep apnea.  No smoke exposure, occasional beer and wine. Sensory changes started 4 years ago, numbness and coldness in the last 2 toes. Has worsened. Symmetric. Now toes and the bottom of the feet. Balance is fine. Mostly numbness and cold not painful. No weakness, no cramping and no balance issues. Slowly progressive. No exposure to toxins, medications that can cause neuropathy.    Reviewed notes, labs and imaging from outside physicians, which showed:  Reviewed notes from [facility] orth[facility]ics.  She presented with a hip problem.  Pain and tenderness to palpation and lying on affected side.  Symptoms started September 2018.  No radiation of symptoms.  The problems are dull and aching.  Mild.  Exacerbated by lying on the affected side.  No low back pain, numbness in the leg or weakness of the leg.  It was noted that she has numbness and tingling of the foot and referred to neurology.  Problems with the hip diagnosed with trochanteric bursitis that is improving with home exercising, icing and intermittent use of anti-inflammatories.  An injection was considered.  Given the numbness and tingling peripheral neuropathy was postulated and sent to neurology for consult.   Review of Systems: Patient complains of symptoms per HPI as well as the following symptoms: neuropathy. Pertinent negatives and positives per HPI. All others negative.   Social History   Socioeconomic History  . Marital status: Married    Spouse name: Not on file  . Number of children: 1  . Years of education: Not on file  . Highest education level: Bachelor's degree (e.g., BA, AB, BS)  Social Needs  . Financial resource strain: Not on  file  . Food insecurity - worry: Not on file  . Food insecurity - inability: Not on file  . Transportation needs - medical: Not on file  . Transportation needs - non-medical: Not on file  Occupational History  . Not on file  Tobacco Use  . Smoking status: Former Smoker    Packs/day: 0.10    Years: 1.00    Pack years: 0.10    Types: Cigarettes    Last attempt to quit: 05/19/1975    Years since quitting: 41.9  . Smokeless tobacco: Never Used  . Tobacco comment: Smoked in college- occasional/social   Substance and Sexual Activity  . Alcohol use: Yes    Alcohol/week: 0.0 oz    Comment: social; 4 beers a month max  . Drug use: No  . Sexual activity: Not on file  Other Topics Concern  . Not on file  Social History Narrative   Lives at home with her husband   Right handed   Drinks 3 diet sodas daily    Family History  Problem Relation Age of Onset  . Other Mother        brain aneurysm  . Heart attack Father   . Hypertension Father   . Hyperlipidemia Father   . Hyperlipidemia Brother   . Gout Brother   . Neuropathy Neg Hx     Past Medical History:  Diagnosis Date  . Anxiety   . Cervical spondylosis    evaluated in the past by neurossurgeon  . Depression   .  Fibroids    in the past   . Hypercholesteremia   . Hypertension   . Mental disorder   . Sleep apnea    on CPAP    Past Surgical History:  Procedure Laterality Date  . CESAREAN SECTION    . CHOLECYSTECTOMY      Current Outpatient Medications  Medication Sig Dispense Refill  . buPROPion (WELLBUTRIN SR) 200 MG 12 hr tablet Take 200 mg by mouth daily.    . Cholecalciferol (VITAMIN D) 2000 units CAPS Take 2,000 Units by mouth daily.     Marland Kitchen CLODERM 0.1 % cream Apply 1 application topically 2 (two) times daily as needed. Reported on 10/26/2015    . escitalopram (LEXAPRO) 20 MG tablet Take 1 tablet (20 mg total) by mouth daily. For depression and anxiety. 30 tablet 0  . ibuprofen (ADVIL,MOTRIN) 200 MG tablet Take  400 mg by mouth every 6 (six) hours as needed. For headache    . lamoTRIgine (LAMICTAL) 25 MG tablet Take 50 mg by mouth daily.    . Multiple Vitamin (MULTIVITAMIN WITH MINERALS) TABS Take 1 tablet by mouth daily.    . Omega-3 Fatty Acids (FISH OIL) 1200 MG CAPS Take 3 capsules by mouth daily.    . vitamin E 100 UNIT capsule Take 100 Units by mouth daily.      No current facility-administered medications for this visit.     Allergies as of 04/15/2017 - Review Complete 04/15/2017  Allergen Reaction Noted  . Shellfish allergy Anaphylaxis 08/21/2011  . Statins Other (See Comments) 01/08/2014  . Flagyl [metronidazole hcl] Rash 08/21/2011    Vitals: BP 131/80 (BP Location: Right Arm, Patient Position: Sitting)   Pulse 65   Ht 5' 5"  (1.651 m)   Wt 136 lb 9.6 oz (62 kg)   BMI 22.73 kg/m  Last Weight:  Wt Readings from Last 1 Encounters:  04/15/17 136 lb 9.6 oz (62 kg)   Last Height:   Ht Readings from Last 1 Encounters:  04/15/17 5' 5"  (1.651 m)    Physical exam: Exam: Gen: NAD, conversant, well nourised, well groomed                     CV: RRR, no MRG. No Carotid Bruits. No peripheral edema, warm, nontender Eyes: Conjunctivae clear without exudates or hemorrhage  Neuro: Detailed Neurologic Exam  Speech:    Speech is normal; fluent and spontaneous with normal comprehension.  Cognition:    The patient is oriented to person, place, and time;     recent and remote memory intact;     language fluent;     normal attention, concentration,     fund of knowledge Cranial Nerves:    The pupils are equal, round, and reactive to light. Attempted fundoscopic exam could not visualize. Visual fields are full to finger confrontation. Extraocular movements are intact. Trigeminal sensation is intact and the muscles of mastication are normal. The face is symmetric. The palate elevates in the midline. Hearing intact. Voice is normal. Shoulder shrug is normal. The tongue has normal motion  without fasciculations.   Coordination:    Normal finger to nose and heel to shin. Normal rapid alternating movements.   Gait:    Heel-toe and tandem gait are normal.   Motor Observation:    No asymmetry, no atrophy, and no involuntary movements noted. Tone:    Normal muscle tone.    Posture:    Posture is normal. normal erect    Strength:  Strength is V/V in the upper and lower limbs.      Sensation: intact to LT, pin prick, minimally  decreased vibration distally     Reflex Exam:  DTR's:    Deep tendon reflexes in the upper and lower extremities are normal bilaterally.   Toes:    The toes are downgoing bilaterally.   Clonus:    Clonus is absent.      Assessment/Plan:  66 year old with 4 years of sensory changes in the toes, mostly coldness and numbness. 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  Will perform a complete serum neuropathy panel.  Discussed the causes of peripheral neuropathy, the most common being diabetes which patient does not report having. About 20 million people in the [place] states have some form of peripheral neuropathy. This is a condition that develops as a result of damage to the peripheral nervous system.  There are multiple causes including metabolic, toxic, infectious and endocrine disorders, small vessel disease, autoimmune diseases, and others. We'll perform extensive serum neuropathy screening.  Orders Placed This Encounter  Procedures  . Hemoglobin A1c  . Methylmalonic acid, serum  . Vitamin B1  . Vitamin B6  . B. burgdorfi Antibody  . Angiotensin converting enzyme  . TSH  . ANA w/Reflex  . Sjogren's syndrome antibods(ssa + ssb)  . Pan-ANCA  . B12 and Folate Panel  . Hepatitis C antibody  . Tissue transglutaminase, IgA  . Gliadin antibodies, serum  . Rheumatoid factor  . Heavy metals, blood  . Multiple Myeloma Panel (SPEP&IFE w/QIG)    Cc: Dr. Simmie Davies, [facility] [address] [place], [place] [address]  Phone [phone] Fax [phone]

## 2017-04-15 NOTE — Patient Instructions (Signed)
Peripheral Neuropathy Peripheral neuropathy is a type of nerve damage. It affects nerves that carry signals between the spinal cord and other parts of the body. These are called peripheral nerves. With peripheral neuropathy, one nerve or a group of nerves may be damaged. What are the causes? Many things can damage peripheral nerves. For some people with peripheral neuropathy, the cause is unknown. Some causes include:  Diabetes. This is the most common cause of peripheral neuropathy.  Injury to a nerve.  Pressure or stress on a nerve that lasts a long time.  Too little vitamin B. Alcoholism can lead to this.  Infections.  Autoimmune diseases, such as multiple sclerosis and systemic lupus erythematosus.  Inherited nerve diseases.  Some medicines, such as cancer drugs.  Toxic substances, such as lead and mercury.  Too little blood flowing to the legs.  Kidney disease.  Thyroid disease.  What are the signs or symptoms? Different people have different symptoms. The symptoms you have will depend on which of your nerves is damaged. Common symptoms include:  Loss of feeling (numbness) in the feet and hands.  Tingling in the feet and hands.  Pain that burns.  Very sensitive skin.  Weakness.  Not being able to move a part of the body (paralysis).  Muscle twitching.  Clumsiness or poor coordination.  Loss of balance.  Not being able to control your bladder.  Feeling dizzy.  Sexual problems.  How is this diagnosed? Peripheral neuropathy is a symptom, not a disease. Finding the cause of peripheral neuropathy can be hard. To figure that out, your health care provider will take a medical history and do a physical exam. A neurological exam will also be done. This involves checking things affected by your brain, spinal cord, and nerves (nervous system). For example, your health care provider will check your reflexes, how you move, and what you can feel. Other types of tests  may also be ordered, such as:  Blood tests.  A test of the fluid in your spinal cord.  Imaging tests, such as CT scans or an MRI.  Electromyography (EMG). This test checks the nerves that control muscles.  Nerve conduction velocity tests. These tests check how fast messages pass through your nerves.  Nerve biopsy. A small piece of nerve is removed. It is then checked under a microscope.  How is this treated?  Medicine is often used to treat peripheral neuropathy. Medicines may include: ? Pain-relieving medicines. Prescription or over-the-counter medicine may be suggested. ? Antiseizure medicine. This may be used for pain. ? Antidepressants. These also may help ease pain from neuropathy. ? Lidocaine. This is a numbing medicine. You might wear a patch or be given a shot. ? Mexiletine. This medicine is typically used to help control irregular heart rhythms.  Surgery. Surgery may be needed to relieve pressure on a nerve or to destroy a nerve that is causing pain.  Physical therapy to help movement.  Assistive devices to help movement. Follow these instructions at home:  Only take over-the-counter or prescription medicines as directed by your health care provider. Follow the instructions carefully for any given medicines. Do not take any other medicines without first getting approval from your health care provider.  If you have diabetes, work closely with your health care provider to keep your blood sugar under control.  If you have numbness in your feet: ? Check every day for signs of injury or infection. Watch for redness, warmth, and swelling. ? Wear padded socks and comfortable   shoes. These help protect your feet.  Do not do things that put pressure on your damaged nerve.  Do not smoke. Smoking keeps blood from getting to damaged nerves.  Avoid or limit alcohol. Too much alcohol can cause a lack of B vitamins. These vitamins are needed for healthy nerves.  Develop a good  support system. Coping with peripheral neuropathy can be stressful. Talk to a mental health specialist or join a support group if you are struggling.  Follow up with your health care provider as directed. Contact a health care provider if:  You have new signs or symptoms of peripheral neuropathy.  You are struggling emotionally from dealing with peripheral neuropathy.  You have a fever. Get help right away if:  You have an injury or infection that is not healing.  You feel very dizzy or begin vomiting.  You have chest pain.  You have trouble breathing. This information is not intended to replace advice given to you by your health care provider. Make sure you discuss any questions you have with your health care provider. Document Released: 04/24/2002 Document Revised: 10/10/2015 Document Reviewed: 01/09/2013 Elsevier Interactive Patient Education  2017 Elsevier Inc.  

## 2017-04-18 ENCOUNTER — Encounter: Payer: Self-pay | Admitting: Neurology

## 2017-04-18 DIAGNOSIS — G609 Hereditary and idiopathic neuropathy, unspecified: Secondary | ICD-10-CM | POA: Insufficient documentation

## 2017-04-19 LAB — MULTIPLE MYELOMA PANEL, SERUM
ALPHA2 GLOB SERPL ELPH-MCNC: 0.7 g/dL (ref 0.4–1.0)
Albumin SerPl Elph-Mcnc: 3.6 g/dL (ref 2.9–4.4)
Albumin/Glob SerPl: 1.1 (ref 0.7–1.7)
Alpha 1: 0.2 g/dL (ref 0.0–0.4)
B-Globulin SerPl Elph-Mcnc: 1.2 g/dL (ref 0.7–1.3)
GAMMA GLOB SERPL ELPH-MCNC: 1.3 g/dL (ref 0.4–1.8)
Globulin, Total: 3.4 g/dL (ref 2.2–3.9)
IGA/IMMUNOGLOBULIN A, SERUM: 315 mg/dL (ref 87–352)
IGM (IMMUNOGLOBULIN M), SRM: 162 mg/dL (ref 26–217)
IgG (Immunoglobin G), Serum: 1304 mg/dL (ref 700–1600)
Total Protein: 7 g/dL (ref 6.0–8.5)

## 2017-04-19 LAB — HEAVY METALS, BLOOD
Arsenic: 7 ug/L (ref 2–23)
LEAD, BLOOD: NOT DETECTED ug/dL (ref 0–4)
Mercury: NOT DETECTED ug/L (ref 0.0–14.9)

## 2017-04-19 LAB — ANGIOTENSIN CONVERTING ENZYME: Angio Convert Enzyme: 30 U/L (ref 14–82)

## 2017-04-19 LAB — B. BURGDORFI ANTIBODIES: Lyme IgG/IgM Ab: <0.91 {ISR} (ref 0.00–0.90)

## 2017-04-19 LAB — B12 AND FOLATE PANEL
Folate: >20 ng/mL (ref >3.0)
Vitamin B-12: 669 pg/mL (ref 232–1245)

## 2017-04-19 LAB — METHYLMALONIC ACID, SERUM: METHYLMALONIC ACID: 157 nmol/L (ref 0–378)

## 2017-04-19 LAB — SJOGREN'S SYNDROME ANTIBODS(SSA + SSB)

## 2017-04-19 LAB — GLIADIN ANTIBODIES, SERUM
ANTIGLIADIN ABS, IGA: 7 U (ref 0–19)
Gliadin IgG: 3 units (ref 0–19)

## 2017-04-19 LAB — PAN-ANCA
Atypical pANCA: <1:20 {titer}
C-ANCA: <1:20 {titer}
P-ANCA: <1:20 {titer}

## 2017-04-19 LAB — VITAMIN B6: Vitamin B6: 59.2 ug/L — ABNORMAL HIGH (ref 2.0–32.8)

## 2017-04-19 LAB — TISSUE TRANSGLUTAMINASE, IGA

## 2017-04-19 LAB — TSH: TSH: 3.57 u[IU]/mL (ref 0.450–4.500)

## 2017-04-19 LAB — HEMOGLOBIN A1C
ESTIMATED AVERAGE GLUCOSE: 114 mg/dL
Hgb A1c MFr Bld: 5.6 % (ref 4.8–5.6)

## 2017-04-19 LAB — ANA W/REFLEX: ANA: NEGATIVE

## 2017-04-19 LAB — HEPATITIS C ANTIBODY: Hep C Virus Ab: <0.1 s/co ratio (ref 0.0–0.9)

## 2017-04-19 LAB — RHEUMATOID FACTOR

## 2017-04-19 LAB — VITAMIN B1: Thiamine: 117.3 nmol/L (ref 66.5–200.0)

## 2017-04-20 ENCOUNTER — Telehealth: Payer: Self-pay | Admitting: *Deleted

## 2017-04-20 NOTE — Telephone Encounter (Signed)
Called and spoke with pt about unremarkable labs per AA,MD note. She verbalized understanding.

## 2017-04-20 NOTE — Telephone Encounter (Signed)
-----   Message from Melvenia Beam, MD sent at 04/19/2017  5:11 PM EST ----- Labs unremarkable

## 2018-02-08 ENCOUNTER — Other Ambulatory Visit: Payer: Self-pay | Admitting: Family Medicine

## 2018-02-08 DIAGNOSIS — Z1231 Encounter for screening mammogram for malignant neoplasm of breast: Secondary | ICD-10-CM

## 2018-03-08 ENCOUNTER — Ambulatory Visit
Admission: RE | Admit: 2018-03-08 | Discharge: 2018-03-08 | Disposition: A | Discharge status: Home / Self Care | Payer: 59 | Source: Ambulatory Visit | Attending: Family Medicine | Admitting: Family Medicine

## 2018-03-08 DIAGNOSIS — Z1231 Encounter for screening mammogram for malignant neoplasm of breast: Secondary | ICD-10-CM

## 2018-03-10 ENCOUNTER — Other Ambulatory Visit: Payer: Self-pay | Admitting: Family Medicine

## 2018-03-10 DIAGNOSIS — R928 Other abnormal and inconclusive findings on diagnostic imaging of breast: Secondary | ICD-10-CM

## 2018-03-12 ENCOUNTER — Emergency Department (HOSPITAL_COMMUNITY): Payer: 59

## 2018-03-12 ENCOUNTER — Other Ambulatory Visit: Payer: Self-pay

## 2018-03-12 ENCOUNTER — Emergency Department (HOSPITAL_COMMUNITY)
Admission: EM | Admit: 2018-03-12 | Discharge: 2018-03-12 | Disposition: A | Discharge status: Home / Self Care | Payer: 59 | Attending: Emergency Medicine | Admitting: Emergency Medicine

## 2018-03-12 ENCOUNTER — Encounter (HOSPITAL_COMMUNITY): Payer: Self-pay

## 2018-03-12 DIAGNOSIS — R079 Chest pain, unspecified: Secondary | ICD-10-CM | POA: Diagnosis not present

## 2018-03-12 DIAGNOSIS — R110 Nausea: Secondary | ICD-10-CM | POA: Diagnosis not present

## 2018-03-12 DIAGNOSIS — Z87891 Personal history of nicotine dependence: Secondary | ICD-10-CM | POA: Insufficient documentation

## 2018-03-12 DIAGNOSIS — Z79899 Other long term (current) drug therapy: Secondary | ICD-10-CM | POA: Insufficient documentation

## 2018-03-12 DIAGNOSIS — I10 Essential (primary) hypertension: Secondary | ICD-10-CM | POA: Diagnosis not present

## 2018-03-12 DIAGNOSIS — M549 Dorsalgia, unspecified: Secondary | ICD-10-CM | POA: Insufficient documentation

## 2018-03-12 LAB — CBC
HCT: 40.1 % (ref 36.0–46.0)
Hemoglobin: 13 g/dL (ref 12.0–15.0)
MCH: 30.2 pg (ref 26.0–34.0)
MCHC: 32.4 g/dL (ref 30.0–36.0)
MCV: 93.3 fL (ref 80.0–100.0)
Platelets: 261 10*3/uL (ref 150–400)
RBC: 4.3 MIL/uL (ref 3.87–5.11)
RDW: 12.1 % (ref 11.5–15.5)
WBC: 7.7 10*3/uL (ref 4.0–10.5)
nRBC: 0 % (ref 0.0–0.2)

## 2018-03-12 LAB — BASIC METABOLIC PANEL
Anion gap: 9 (ref 5–15)
BUN: 21 mg/dL (ref 8–23)
CO2: 25 mmol/L (ref 22–32)
Calcium: 9.5 mg/dL (ref 8.9–10.3)
Chloride: 103 mmol/L (ref 98–111)
Creatinine, Ser: 0.94 mg/dL (ref 0.44–1.00)
GFR calc Af Amer: >60 mL/min (ref >60)
GFR calc non Af Amer: >60 mL/min (ref >60)
Glucose, Bld: 91 mg/dL (ref 70–99)
Potassium: 4 mmol/L (ref 3.5–5.1)
Sodium: 137 mmol/L (ref 135–145)

## 2018-03-12 LAB — TROPONIN I: Troponin I: <0.03 ng/mL (ref <0.03)

## 2018-03-12 LAB — POCT I-STAT TROPONIN I: TROPONIN I, POC: 0 ng/mL (ref 0.00–0.08)

## 2018-03-12 MED ORDER — IBUPROFEN 200 MG PO TABS
600.0000 mg | ORAL_TABLET | Freq: Once | ORAL | Status: AC
  Administered 2018-03-12: 600 mg via ORAL
  Filled 2018-03-12: qty 3

## 2018-03-12 MED ORDER — TRAMADOL HCL 50 MG PO TABS
50.0000 mg | ORAL_TABLET | Freq: Once | ORAL | Status: AC
Start: 2018-03-12 — End: 2018-03-12
  Administered 2018-03-12: 50 mg via ORAL
  Filled 2018-03-12: qty 1

## 2018-03-12 NOTE — ED Provider Notes (Signed)
[place] DEPT Provider Note   CSN: [id number] Arrival date & time: 03/12/18  1440     History   Chief Complaint Chief Complaint  Patient presents with  . Chest Pain    HPI Kayla Deleon is a 67 y.o. female.  HPI   67 year old female with chest pain.  Onset several days ago.  In the center of her chest.  Says it is feels uncomfortable.  Some pain into her upper back as well.  Is generally does not feel well.  No shortness of breath.  Maybe some mild nausea but does not necessarily seem to be associated with her pain.  No fevers or chills.  No unusual leg pain or swelling.  Past Medical History:  Diagnosis Date  . Anxiety   . Cervical spondylosis    evaluated in the past by neurossurgeon  . Depression   . Fibroids    in the past   . Hypercholesteremia   . Hypertension   . Mental disorder   . Sleep apnea    on CPAP    Patient Active Problem List   Diagnosis Date Noted  . 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 Well adult exam 02/15/2017  . Cough 07/04/2015  . Generalized anxiety disorder 08/25/2011  . Major depressive disorder, recurrent ([place]) 08/21/2011    Past Surgical History:  Procedure Laterality Date  . CESAREAN SECTION    . CHOLECYSTECTOMY       OB History   None      Home Medications    Prior to Admission medications   Medication Sig Start Date End Date Taking? Authorizing Provider  buPROPion (WELLBUTRIN SR) 200 MG 12 hr tablet Take 200 mg by mouth daily. 10/19/15   [provider]  Cholecalciferol (VITAMIN D) 2000 units CAPS Take 2,000 Units by mouth daily.     [provider]  CLODERM 0.1 % cream Apply 1 application topically 2 (two) times daily as needed. Reported on 10/26/2015 10/25/15   [provider]  escitalopram (LEXAPRO) 20 MG tablet Take 1 tablet (20 mg total) by mouth daily. For depression and anxiety. 08/28/11   Greig Castilla, FNP  ibuprofen  (ADVIL,MOTRIN) 200 MG tablet Take 400 mg by mouth every 6 (six) hours as needed. For headache    [provider]  lamoTRIgine (LAMICTAL) 25 MG tablet Take 50 mg by mouth daily.    [provider]  Multiple Vitamin (MULTIVITAMIN WITH MINERALS) TABS Take 1 tablet by mouth daily.    [provider]  Omega-3 Fatty Acids (FISH OIL) 1200 MG CAPS Take 3 capsules by mouth daily.    [provider]  vitamin E 100 UNIT capsule Take 100 Units by mouth daily.     [provider]    Family History Family History  Problem Relation Age of Onset  . Other Mother        brain aneurysm  . Heart attack Father   . Hypertension Father   . Hyperlipidemia Father   . Hyperlipidemia Brother   . Gout Brother   . Neuropathy Neg Hx     Social History Social History   Tobacco Use  . Smoking status: Former Smoker    Packs/day: 0.10    Years: 1.00    Pack years: 0.10    Types: Cigarettes    Last attempt to quit: 05/19/1975    Years since quitting: 42.8  . Smokeless tobacco: Never Used  . Tobacco comment: Smoked  in college- occasional/social   Substance Use Topics  . Alcohol use: Yes    Alcohol/week: 0.0 standard drinks    Comment: social; 4 beers a month max  . Drug use: No     Allergies   Shellfish allergy; Statins; and Flagyl [metronidazole hcl]   Review of Systems Review of Systems  All systems reviewed and negative, other than as noted in HPI.  Physical Exam Updated Vital Signs BP (!) 143/67 (BP Location: Right Arm)   Pulse 69   Temp 98 F (36.7 C) (Oral)   Resp 17   SpO2 98%   Physical Exam  Constitutional: She appears well-developed and well-nourished. No distress.  HENT:  Head: Normocephalic and atraumatic.  Eyes: Conjunctivae are normal. Right eye exhibits no discharge. Left eye exhibits no discharge.  Neck: Neck supple.  Cardiovascular: Normal rate, regular rhythm and normal heart sounds. Exam reveals no gallop and no friction rub.    No murmur heard. Pulmonary/Chest: Effort normal and breath sounds normal. No respiratory distress.  Abdominal: Soft. She exhibits no distension. There is no tenderness.  Musculoskeletal: She exhibits no edema or tenderness.  Lower extremities symmetric as compared to each other. No calf tenderness. Negative Homan's. No palpable cords.   Neurological: She is alert.  Skin: Skin is warm and dry.  Psychiatric: She has a normal mood and affect. Her behavior is normal. Thought content normal.  Nursing note and vitals reviewed.    ED Treatments / Results  Labs (all labs ordered are listed, but only abnormal results are displayed) Labs Reviewed  BASIC METABOLIC PANEL  CBC  TROPONIN I  I-STAT TROPONIN, ED  POCT I-STAT TROPONIN I    EKG EKG Interpretation  Date/Time:  Saturday March 12 2018 14:54:45 EDT Ventricular Rate:  68 PR Interval:    QRS Duration: 91 QT Interval:  382 QTC Calculation: 407 R Axis:   87 Text Interpretation:  Sinus rhythm Borderline right axis deviation Confirmed by Virgel Manifold [phone]) on 03/12/2018 7:15:22 PM   Radiology Dg Chest 2 View  Result Date: 03/12/2018 CLINICAL DATA:  She c/o nausea and "not feeling well and aching in my arms" for past few days. She c/o "for about the past hour, I have had some chest discomfort". Hx of HTN EXAM: CHEST - 2 VIEW COMPARISON:  10/26/2015 FINDINGS: Heart size is normal. The lungs are free of focal consolidations and pleural effusions. No pulmonary edema. There is convex RIGHT scoliosis of the thoracic spine. Surgical clips are present in the RIGHT UPPER QUADRANT of the abdomen. IMPRESSION: No evidence for acute cardiopulmonary abnormality. Electronically Signed   By: Nolon Nations M.D.   On: 03/12/2018 15:16    Procedures Procedures (including critical care time)  Medications Ordered in ED Medications  traMADol (ULTRAM) tablet 50 mg (has no administration in time range)  ibuprofen (ADVIL,MOTRIN) tablet 600  mg (has no administration in time range)     Initial Impression / Assessment and Plan / ED Course  I have reviewed the triage vital signs and the nursing notes.  Pertinent labs & imaging results that were available during my care of the patient were reviewed by me and considered in my medical decision making (see chart for details).     67 year old female with chest pain.  Doubt ACS, PE, dissection or other emergent process.  ED work-up fairly unremarkable. It has been determined that no acute conditions requiring further emergency intervention are present at this time. The patient has been advised of the diagnosis  and plan. I reviewed any labs and imaging including any potential incidental findings. We have discussed signs and symptoms that warrant return to the ED and they are listed in the discharge instructions.    Final Clinical Impressions(s) / ED Diagnoses   Final diagnoses:  Chest pain, unspecified type    ED Discharge Orders    None       Virgel Manifold, MD 03/17/18 1510

## 2018-03-12 NOTE — ED Triage Notes (Signed)
She c/o nausea and "not feeling well and aching in my arms" for past few days. She c/o "for about the past hour, I have had some chest discomfort". EKG/CXR performed at triage.

## 2018-03-12 NOTE — ED Notes (Signed)
Pt stated that pain is more in the middle of her upper back than her chest now

## 2018-03-12 NOTE — Discharge Instructions (Addendum)
Repeat troponin (cardiac enzymes) remains normal.  Her EKG is not sniffily changed from prior ones.  Your other labs and chest x-ray do not show any concerning findings.  I have a low suspicion for emergent cause of your chest pain.  Going to follow-up with your PCP.  Return to the emergency room for worsening pain, shortness of breath fever or anything else that you may feel requires emergent attention.  You may take ibuprofen 600 mg every 6 hours as needed for pain otherwise.

## 2018-03-17 ENCOUNTER — Ambulatory Visit
Admission: RE | Admit: 2018-03-17 | Discharge: 2018-03-17 | Disposition: A | Discharge status: Home / Self Care | Payer: 59 | Source: Ambulatory Visit | Attending: Family Medicine | Admitting: Family Medicine

## 2018-03-17 DIAGNOSIS — R928 Other abnormal and inconclusive findings on diagnostic imaging of breast: Secondary | ICD-10-CM

## 2018-04-18 ENCOUNTER — Ambulatory Visit: Payer: 59 | Admitting: Neurology

## 2018-05-05 ENCOUNTER — Ambulatory Visit: Payer: 59 | Admitting: Neurology

## 2018-07-27 ENCOUNTER — Ambulatory Visit: Payer: 59 | Admitting: Neurology

## 2019-04-25 ENCOUNTER — Other Ambulatory Visit: Payer: Self-pay | Admitting: Family Medicine

## 2019-04-25 DIAGNOSIS — Z1231 Encounter for screening mammogram for malignant neoplasm of breast: Secondary | ICD-10-CM

## 2019-06-07 ENCOUNTER — Ambulatory Visit: Payer: 59 | Attending: Internal Medicine

## 2019-06-07 DIAGNOSIS — Z23 Encounter for immunization: Secondary | ICD-10-CM | POA: Insufficient documentation

## 2019-06-07 NOTE — Progress Notes (Signed)
   Covid-19 Vaccination Clinic  Name:  Kayla Deleon    MRN: [id number] DOB: 12/22/1950  06/07/2019  [name] was observed post Covid-19 immunization for 15 minutes without incidence. She was provided with Vaccine Information Sheet and instruction to access the V-Safe system.   [name] was instructed to call 911 with any severe reactions post vaccine: Marland Kitchen Difficulty breathing  . Swelling of your face and throat  . A fast heartbeat  . A bad rash all over your body  . Dizziness and weakness    Immunizations Administered    Name Date Dose VIS Date Route   Pfizer COVID-19 Vaccine 06/07/2019  4:24 PM 0.3 mL 04/28/2019 Intramuscular   Manufacturer: [place]   Lot: [id number]   [place]: [id number]

## 2019-06-15 ENCOUNTER — Ambulatory Visit: Payer: 59

## 2019-06-25 ENCOUNTER — Ambulatory Visit: Payer: 59 | Attending: Internal Medicine

## 2019-06-25 DIAGNOSIS — Z23 Encounter for immunization: Secondary | ICD-10-CM

## 2019-06-25 NOTE — Progress Notes (Signed)
   Covid-19 Vaccination Clinic  Name:  Brandyce Salmi    MRN: [id number] DOB: [birth year]  06/25/2019  Ms. Roloff was observed post Covid-19 immunization for 15 minutes without incidence. She was provided with Vaccine Information Sheet and instruction to access the V-Safe system.   Ms. Phu was instructed to call 911 with any severe reactions post vaccine: Marland Kitchen Difficulty breathing  . Swelling of your face and throat  . A fast heartbeat  . A bad rash all over your body  . Dizziness and weakness    Immunizations Administered    Name Date Dose VIS Date Route   Pfizer COVID-19 Vaccine 06/25/2019  9:01 AM 0.3 mL 04/28/2019 Intramuscular   Manufacturer: [place]   Lot: [id number]   [place]: [id number]

## 2019-07-20 ENCOUNTER — Ambulatory Visit: Payer: 59

## 2019-07-26 ENCOUNTER — Other Ambulatory Visit: Payer: Self-pay

## 2019-07-26 ENCOUNTER — Ambulatory Visit: Payer: 59

## 2019-07-26 ENCOUNTER — Ambulatory Visit
Admission: RE | Admit: 2019-07-26 | Discharge: 2019-07-26 | Disposition: A | Discharge status: Home / Self Care | Payer: 59 | Source: Ambulatory Visit | Attending: Family Medicine | Admitting: Family Medicine

## 2019-07-26 DIAGNOSIS — Z1231 Encounter for screening mammogram for malignant neoplasm of breast: Secondary | ICD-10-CM

## 2019-08-02 ENCOUNTER — Other Ambulatory Visit (HOSPITAL_COMMUNITY)
Admission: RE | Admit: 2019-08-02 | Discharge: 2019-08-02 | Disposition: A | Discharge status: Home / Self Care | Payer: 59 | Source: Ambulatory Visit | Attending: Family Medicine | Admitting: Family Medicine

## 2019-08-02 DIAGNOSIS — Z87898 Personal history of other specified conditions: Secondary | ICD-10-CM | POA: Insufficient documentation

## 2019-08-03 ENCOUNTER — Other Ambulatory Visit: Payer: Self-pay | Admitting: Family Medicine

## 2019-08-09 LAB — CYTOLOGY - PAP
Comment: NEGATIVE
Diagnosis: NEGATIVE
High risk HPV: NEGATIVE

## 2019-08-15 ENCOUNTER — Ambulatory Visit: Payer: 59

## 2019-10-09 ENCOUNTER — Other Ambulatory Visit: Payer: Self-pay

## 2019-10-09 ENCOUNTER — Ambulatory Visit (INDEPENDENT_AMBULATORY_CARE_PROVIDER_SITE_OTHER): Payer: 59 | Admitting: Internal Medicine

## 2019-10-09 ENCOUNTER — Encounter: Payer: Self-pay | Admitting: Internal Medicine

## 2019-10-09 VITALS — BP 130/78 | HR 89 | Temp 97.1°F | Ht 64.0 in | Wt 141.8 lb

## 2019-10-09 DIAGNOSIS — M791 Myalgia, unspecified site: Secondary | ICD-10-CM

## 2019-10-09 DIAGNOSIS — Z5181 Encounter for therapeutic drug level monitoring: Secondary | ICD-10-CM

## 2019-10-09 DIAGNOSIS — E7849 Other hyperlipidemia: Secondary | ICD-10-CM

## 2019-10-09 DIAGNOSIS — T466X5A Adverse effect of antihyperlipidemic and antiarteriosclerotic drugs, initial encounter: Secondary | ICD-10-CM | POA: Diagnosis not present

## 2019-10-09 NOTE — Patient Instructions (Addendum)
Dr. Debara Pickett recommends REPATHA (PCSK9). This is an injectable cholesterol medication self-administered once every 14  days. This medication will need prior approval with your insurance company, which we will work on. If the medication is not approved initially, we may need to do an appeal with your insurance. We will keep you updated on this process.   Administer medication in area of fatty tissue such as abdomen, outer thigh, back up of arm - and rotate site with each injection Store medication in refrigerator until ready to administer - allow to sit at room temp for 30 mins - 1 hour prior to injection Dispose of medication in a SHARPS container - your pharmacy should be able to direct you on this and proper disposal   If you need co-pay assistance, please look into the program at healthwellfoundation.[email or web address] >> disease funds >> hypercholesterolemia. This is an online application or you can call to complete.   If you need a co-pay card for Repatha: [email or web address] >> paying for Repatha  Dr. Debara Pickett recommends that you schedule a follow up visit with him the in the [place] in 3  Months AFTER STARTING Please have fasting blood work about 1 week prior to this visit and he will review the blood work results with you at your appointment.   CALCIUM SCORE, WILL COST $150 OUT OF POCKET

## 2019-10-09 NOTE — Progress Notes (Signed)
LIPID CLINIC CONSULT NOTE  Chief Complaint:  Manage dyslipidemia  Primary Care Physician: Hulan Fess, MD  Primary Cardiologist:  No primary care provider on file.  HPI:  Kayla Deleon is a 69 y.o. female who is being seen today for the evaluation of dyslipidemia at the request of Little, Lennette Bihari, MD.  This is a pleasant 69 year old female who is cause and is a patient of mine with high cholesterol.  Her father died of MI at age 29 and had high cholesterol and she has 3 brothers who have high cholesterol as well.  There is concern for familial hyperlipidemia.  Most recently her lipid profile showed total cholesterol 261, triglycerides 180, HDL 53 and LDL of 175.  Non-HDL cholesterol 208.  Hemoglobin A1c was 6.2.  Unfortunately, she could not tolerate statin medications.  She has been on a number of them over the years which caused severe myalgias.  She is referred for evaluation and possible PCSK9 inhibitor.  She reports a very healthy diet low in saturated fat and it stays physically active.  At one point her lowest cholesterol numbers were when she weighed about 120 pounds however she has not been at that weight for several years.  PMHx:  Past Medical History:  Diagnosis Date  . Anxiety   . Cervical spondylosis    evaluated in the past by neurossurgeon  . Depression   . Fibroids    in the past   . Hypercholesteremia   . Hypertension   . Mental disorder   . Sleep apnea    on CPAP    Past Surgical History:  Procedure Laterality Date  . CESAREAN SECTION    . CHOLECYSTECTOMY      FAMHx:  Family History  Problem Relation Age of Onset  . Other Mother        brain aneurysm  . Heart attack Father   . Hypertension Father   . Hyperlipidemia Father   . Hyperlipidemia Brother   . Gout Brother   . Neuropathy Neg Hx     SOCHx:   reports that she quit smoking about 44 years ago. Her smoking use included cigarettes. She has a 0.10 pack-year smoking history. She has never  used smokeless tobacco. She reports current alcohol use. She reports that she does not use drugs.  ALLERGIES:  Allergies  Allergen Reactions  . Shellfish Allergy Anaphylaxis    Pt. Uses Epipen.  . Statins Other (See Comments)    Severe muscle pain  . Flagyl [Metronidazole Hcl] Rash    ROS: Pertinent items noted in HPI and remainder of comprehensive ROS otherwise negative.  HOME MEDS: Current Outpatient Medications on File Prior to Visit  Medication Sig Dispense Refill  . buPROPion (WELLBUTRIN SR) 200 MG 12 hr tablet Take 200 mg by mouth daily.    . Cholecalciferol (VITAMIN D) 2000 units CAPS Take 2,000 Units by mouth daily.     Marland Kitchen CLODERM 0.1 % cream Apply 1 application topically 2 (two) times daily as needed. Reported on 10/26/2015    . escitalopram (LEXAPRO) 20 MG tablet Take 1 tablet (20 mg total) by mouth daily. For depression and anxiety. 30 tablet 0  . ibuprofen (ADVIL,MOTRIN) 200 MG tablet Take 400 mg by mouth every 6 (six) hours as needed. For headache    . lamoTRIgine (LAMICTAL) 25 MG tablet Take 50 mg by mouth daily.    . Multiple Vitamin (MULTIVITAMIN WITH MINERALS) TABS Take 1 tablet by mouth daily.    Marland Kitchen  Omega-3 Fatty Acids (FISH OIL) 1200 MG CAPS Take 3 capsules by mouth daily.    . vitamin E 100 UNIT capsule Take 100 Units by mouth daily.      No current facility-administered medications on file prior to visit.    LABS/IMAGING: No results found for this or any previous visit (from the past 48 hour(s)). No results found.  LIPID PANEL: No results found for: CHOL, TRIG, HDL, CHOLHDL, VLDL, LDLCALC, LDLDIRECT  WEIGHTS: Wt Readings from Last 3 Encounters:  10/09/19 141 lb 12.8 oz (64.3 kg)  04/15/17 136 lb 9.6 oz (62 kg)  02/15/17 133 lb (60.3 kg)    VITALS: BP 130/78   Pulse 89   Temp (!) 97.1 F (36.2 C)   Ht 5\' 4"  (1.626 m)   Wt 141 lb 12.8 oz (64.3 kg)   SpO2 98%   BMI 24.34 kg/m   EXAM: General appearance: alert and no distress Neck: no carotid  bruit, no JVD and thyroid not enlarged, symmetric, no tenderness/mass/nodules Lungs: clear to auscultation bilaterally Heart: regular rate and rhythm Abdomen: soft, non-tender; bowel sounds normal; no masses,  no organomegaly Extremities: extremities normal, atraumatic, no cyanosis or edema and No tendon xanthomas, no corneal arcus Pulses: 2+ and symmetric Skin: Skin color, texture, turgor normal. No rashes or lesions Neurologic: Grossly normal Psych: Pleasant  EKG: Deferred  ASSESSMENT: 1. Probable familial hyperlipidemia 2. Statin intolerance-myalgias 3. Impaired fasting glucose-hemoglobin A1c 6.2 4. Strong family history of premature coronary disease 5. Hypertension 6. OSA on CPAP  PLAN: 1.   Ms. Huger has a probable familial hyperlipidemia.  Unfortunately she has been intolerant to statins due to myalgias.  She has a strong family history of early onset heart disease and other risk factors include hypertension, sleep apnea and impaired fasting glucose.  I think she is a reasonable candidate for PCSK9 inhibitor since there are no other significant options to lower her high LDL cholesterol.  In addition I would recommend calcium scoring to further risk stratify her.  Would recommend starting Repatha 140 mg every 2 weeks.  We will plan repeat lipids after starting the medication about 3 months and follow-up with me at that time.  Thanks again for the kind referral.  Pixie Casino, MD, FACC, [place] Director of the [facility] Diplomate of the American Board of Clinical Lipidology Attending Cardiologist  Direct Dial: [phone]  Fax: [phone]  Website:  [email or web address] 10/09/2019, 9:39 AM

## 2019-10-12 ENCOUNTER — Other Ambulatory Visit: Payer: Self-pay

## 2019-10-12 ENCOUNTER — Ambulatory Visit (INDEPENDENT_AMBULATORY_CARE_PROVIDER_SITE_OTHER)
Admission: RE | Admit: 2019-10-12 | Discharge: 2019-10-12 | Disposition: A | Discharge status: Home / Self Care | Payer: Self-pay | Source: Ambulatory Visit | Attending: Internal Medicine | Admitting: Internal Medicine

## 2019-10-12 DIAGNOSIS — E7849 Other hyperlipidemia: Secondary | ICD-10-CM

## 2019-10-18 DIAGNOSIS — R05 Cough: Secondary | ICD-10-CM | POA: Diagnosis not present

## 2019-10-20 ENCOUNTER — Telehealth: Payer: Self-pay | Admitting: Internal Medicine

## 2019-10-20 MED ORDER — ATORVASTATIN CALCIUM 40 MG PO TABS: 40.0000 mg | ORAL_TABLET | Freq: Every day | ORAL | 3 refills | Status: DC

## 2019-10-20 NOTE — Telephone Encounter (Signed)
Returned call to patient. She states she would like to reattempt taking a statin prior to taking Repatha.   She states it has been 20 years since she took a statin.  She is very hesitant about Repatha-states it has only been on the market for 6 years and doesn't like the idea of having to do self injections.   Reviewed CT calcium score results as well, advised goal is now <70.    She would like to see if Dr. Debara Pickett is ok with her trying a statin first.     Will route to MD to review-VV was cancelled by patient.

## 2019-10-20 NOTE — Telephone Encounter (Signed)
Pt wanted to discuss the option for a lower dose Statin. She cancelled her virtual visit with Dr. Debara Pickett but would still like his advice in regards to medications

## 2019-10-20 NOTE — Telephone Encounter (Signed)
I'm ok with a statin - would need high potency, such as lipitor 40 mg or crestor 20 mg daily.  Dr Lemmie Evens

## 2019-10-20 NOTE — Telephone Encounter (Signed)
Atorvastatin 40mg  sent to pharmacy per patient MyChart reply.

## 2019-10-20 NOTE — Telephone Encounter (Signed)
MyChart message sent to patient with MD advice on meds

## 2019-10-23 ENCOUNTER — Telehealth: Payer: 59 | Admitting: Internal Medicine

## 2019-10-23 DIAGNOSIS — R062 Wheezing: Secondary | ICD-10-CM | POA: Diagnosis not present

## 2019-10-23 DIAGNOSIS — R05 Cough: Secondary | ICD-10-CM | POA: Diagnosis not present

## 2019-10-25 ENCOUNTER — Telehealth: Payer: Self-pay | Admitting: Internal Medicine

## 2019-10-25 NOTE — Telephone Encounter (Signed)
Will route to Rod Can, Dr.Hilty's nurse- and billing as well. Thanks!

## 2019-10-25 NOTE — Telephone Encounter (Signed)
Kayla Deleon is calling wanting to discuss the $300+ bill she received in the mail from [facility] after her appointment with Dr. Debara Pickett. I tried to transfer the call to billing several times, but their phone is currently not working. Please advise.

## 2019-10-31 DIAGNOSIS — H9313 Tinnitus, bilateral: Secondary | ICD-10-CM | POA: Insufficient documentation

## 2019-10-31 DIAGNOSIS — H90A31 Mixed conductive and sensorineural hearing loss, unilateral, right ear with restricted hearing on the contralateral side: Secondary | ICD-10-CM | POA: Diagnosis not present

## 2019-10-31 DIAGNOSIS — H90A22 Sensorineural hearing loss, unilateral, left ear, with restricted hearing on the contralateral side: Secondary | ICD-10-CM | POA: Diagnosis not present

## 2019-10-31 DIAGNOSIS — H9113 Presbycusis, bilateral: Secondary | ICD-10-CM | POA: Diagnosis not present

## 2019-11-02 DIAGNOSIS — R05 Cough: Secondary | ICD-10-CM | POA: Diagnosis not present

## 2019-11-29 DIAGNOSIS — F332 Major depressive disorder, recurrent severe without psychotic features: Secondary | ICD-10-CM | POA: Diagnosis not present

## 2020-05-01 DIAGNOSIS — F332 Major depressive disorder, recurrent severe without psychotic features: Secondary | ICD-10-CM | POA: Diagnosis not present

## 2020-07-02 DIAGNOSIS — L600 Ingrowing nail: Secondary | ICD-10-CM | POA: Diagnosis not present

## 2020-07-02 DIAGNOSIS — M25774 Osteophyte, right foot: Secondary | ICD-10-CM | POA: Diagnosis not present

## 2020-07-05 ENCOUNTER — Other Ambulatory Visit: Payer: Self-pay | Admitting: Family Medicine

## 2020-07-05 DIAGNOSIS — Z1231 Encounter for screening mammogram for malignant neoplasm of breast: Secondary | ICD-10-CM

## 2020-07-26 ENCOUNTER — Emergency Department (HOSPITAL_BASED_OUTPATIENT_CLINIC_OR_DEPARTMENT_OTHER): Payer: PPO

## 2020-07-26 ENCOUNTER — Encounter (HOSPITAL_BASED_OUTPATIENT_CLINIC_OR_DEPARTMENT_OTHER): Payer: Self-pay | Admitting: *Deleted

## 2020-07-26 ENCOUNTER — Emergency Department (HOSPITAL_BASED_OUTPATIENT_CLINIC_OR_DEPARTMENT_OTHER)
Admission: EM | Admit: 2020-07-26 | Discharge: 2020-07-27 | Disposition: A | Discharge status: Home / Self Care | Payer: PPO | Attending: Emergency Medicine | Admitting: Emergency Medicine

## 2020-07-26 ENCOUNTER — Other Ambulatory Visit: Payer: Self-pay

## 2020-07-26 DIAGNOSIS — I10 Essential (primary) hypertension: Secondary | ICD-10-CM | POA: Insufficient documentation

## 2020-07-26 DIAGNOSIS — Z87891 Personal history of nicotine dependence: Secondary | ICD-10-CM | POA: Diagnosis not present

## 2020-07-26 DIAGNOSIS — R079 Chest pain, unspecified: Secondary | ICD-10-CM | POA: Diagnosis not present

## 2020-07-26 DIAGNOSIS — R072 Precordial pain: Secondary | ICD-10-CM

## 2020-07-26 LAB — BASIC METABOLIC PANEL
Anion gap: 8 (ref 5–15)
BUN: 26 mg/dL — ABNORMAL HIGH (ref 8–23)
CO2: 27 mmol/L (ref 22–32)
Calcium: 9.6 mg/dL (ref 8.9–10.3)
Chloride: 102 mmol/L (ref 98–111)
Creatinine, Ser: 0.85 mg/dL (ref 0.44–1.00)
GFR, Estimated: >60 mL/min (ref >60)
Glucose, Bld: 123 mg/dL — ABNORMAL HIGH (ref 70–99)
Potassium: 3.8 mmol/L (ref 3.5–5.1)
Sodium: 137 mmol/L (ref 135–145)

## 2020-07-26 LAB — CBC WITH DIFFERENTIAL/PLATELET
Abs Immature Granulocytes: 0.01 10*3/uL (ref 0.00–0.07)
Basophils Absolute: 0.1 10*3/uL (ref 0.0–0.1)
Basophils Relative: 1 %
Eosinophils Absolute: 0.3 10*3/uL (ref 0.0–0.5)
Eosinophils Relative: 3 %
HCT: 37.6 % (ref 36.0–46.0)
Hemoglobin: 12.4 g/dL (ref 12.0–15.0)
Immature Granulocytes: 0 %
Lymphocytes Relative: 44 %
Lymphs Abs: 3.3 10*3/uL (ref 0.7–4.0)
MCH: 30.2 pg (ref 26.0–34.0)
MCHC: 33 g/dL (ref 30.0–36.0)
MCV: 91.7 fL (ref 80.0–100.0)
Monocytes Absolute: 0.8 10*3/uL (ref 0.1–1.0)
Monocytes Relative: 10 %
Neutro Abs: 3.1 10*3/uL (ref 1.7–7.7)
Neutrophils Relative %: 42 %
Platelets: 244 10*3/uL (ref 150–400)
RBC: 4.1 MIL/uL (ref 3.87–5.11)
RDW: 12.4 % (ref 11.5–15.5)
WBC: 7.5 10*3/uL (ref 4.0–10.5)
nRBC: 0 % (ref 0.0–0.2)

## 2020-07-26 LAB — TROPONIN I (HIGH SENSITIVITY): Troponin I (High Sensitivity): 2 ng/L (ref <18)

## 2020-07-26 MED ORDER — LIDOCAINE VISCOUS HCL 2 % MT SOLN
15.0000 mL | Freq: Once | OROMUCOSAL | Status: AC
  Administered 2020-07-26: 15 mL via ORAL
  Filled 2020-07-26: qty 15

## 2020-07-26 MED ORDER — ALUM & MAG HYDROXIDE-SIMETH 200-200-20 MG/5ML PO SUSP
30.0000 mL | Freq: Once | ORAL | Status: AC
  Administered 2020-07-26: 30 mL via ORAL
  Filled 2020-07-26: qty 30

## 2020-07-26 NOTE — ED Triage Notes (Signed)
Recurrent bilateral CP, nonradiating last night and tonight. Denies SOB, denies N/V. Reports the pain is pressure and stinging pain. Denies increased pain with palpation or deep inspiration.

## 2020-07-26 NOTE — ED Provider Notes (Addendum)
[facility] EMERGENCY DEPT Provider Note   CSN: [id number] Arrival date & time: 07/26/20  2253     History Chief Complaint  Patient presents with  . Chest Pain    Kayla Deleon is a 70 y.o. female.  The history is provided by the patient.  Chest Pain Pain location:  Substernal area Pain quality: dull   Pain quality comment:  Stinging Pain radiates to:  Does not radiate Pain severity:  Moderate Onset quality:  Sudden Timing:  Constant Progression:  Unchanged Chronicity:  Recurrent Context: at rest   Relieved by:  Nothing Worsened by:  Nothing Ineffective treatments:  None tried Associated symptoms: no abdominal pain, no AICD problem, no altered mental status, no anorexia, no anxiety, no back pain, no claudication, no cough, no diaphoresis, no dizziness, no dysphagia, no fatigue, no fever, no headache, no heartburn, no lower extremity edema, no nausea, no near-syncope, no numbness, no orthopnea, no palpitations, no PND, no shortness of breath, no syncope, no vomiting and no weakness   Risk factors: not female        Past Medical History:  Diagnosis Date  . Anxiety   . Cervical spondylosis    evaluated in the past by neurossurgeon  . Depression   . Fibroids    in the past   . Hypercholesteremia   . Hypertension   . Mental disorder   . Sleep apnea    on CPAP    Patient Active Problem List   Diagnosis Date Noted  . 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 Well adult exam 02/15/2017  . Cough 07/04/2015  . Generalized anxiety disorder 08/25/2011  . Major depressive disorder, recurrent ([place]) 08/21/2011    Past Surgical History:  Procedure Laterality Date  . CESAREAN SECTION    . CHOLECYSTECTOMY       OB History   No obstetric history on file.     Family History  Problem Relation Age of Onset  . Other Mother        brain aneurysm  . Heart attack Father   . Hypertension Father   . Hyperlipidemia Father   .  Hyperlipidemia Brother   . Gout Brother   . Neuropathy Neg Hx     Social History   Tobacco Use  . Smoking status: Former Smoker    Packs/day: 0.10    Years: 1.00    Pack years: 0.10    Types: Cigarettes    Quit date: 05/19/1975    Years since quitting: 45.2  . Smokeless tobacco: Never Used  . Tobacco comment: Smoked in college- occasional/social   Vaping Use  . Vaping Use: Never used  Substance Use Topics  . Alcohol use: Yes    Alcohol/week: 0.0 standard drinks    Comment: social; 4 beers a month max  . Drug use: No    Home Medications Prior to Admission medications   Medication Sig Start Date End Date Taking? Authorizing Provider  atorvastatin (LIPITOR) 40 MG tablet Take 1 tablet (40 mg total) by mouth daily. 10/20/19 01/18/20  Pixie Casino, MD  buPROPion (WELLBUTRIN SR) 200 MG 12 hr tablet Take 200 mg by mouth daily. 10/19/15   [provider]  Cholecalciferol (VITAMIN D) 2000 units CAPS Take 2,000 Units by mouth daily.     [provider]  CLODERM 0.1 % cream Apply 1 application topically 2 (two) times daily as needed. Reported on 10/26/2015 10/25/15   [provider]  escitalopram (LEXAPRO) 20 MG tablet  Take 1 tablet (20 mg total) by mouth daily. For depression and anxiety. 08/28/11   Greig Castilla, FNP  ibuprofen (ADVIL,MOTRIN) 200 MG tablet Take 400 mg by mouth every 6 (six) hours as needed. For headache    [provider]  lamoTRIgine (LAMICTAL) 25 MG tablet Take 50 mg by mouth daily.    [provider]  Multiple Vitamin (MULTIVITAMIN WITH MINERALS) TABS Take 1 tablet by mouth daily.    [provider]  Omega-3 Fatty Acids (FISH OIL) 1200 MG CAPS Take 3 capsules by mouth daily.    [provider]  vitamin E 100 UNIT capsule Take 100 Units by mouth daily.     [provider]    Allergies    Shellfish allergy, Statins, and Flagyl [metronidazole hcl]  Review of Systems   Review of Systems   Constitutional: Negative for diaphoresis, fatigue and fever.  HENT: Negative for trouble swallowing.   Eyes: Negative for visual disturbance.  Respiratory: Negative for cough and shortness of breath.   Cardiovascular: Positive for chest pain. Negative for palpitations, orthopnea, claudication, syncope, PND and near-syncope.  Gastrointestinal: Negative for abdominal pain, anorexia, heartburn, nausea and vomiting.  Genitourinary: Negative for difficulty urinating.  Musculoskeletal: Negative for back pain.  Neurological: Negative for dizziness, weakness, numbness and headaches.  Psychiatric/Behavioral: Negative for agitation.  All other systems reviewed and are negative.   Physical Exam Updated Vital Signs BP (!) 177/83 (BP Location: Right Arm)   Pulse 64   Temp 98.1 F (36.7 C) (Oral)   Resp 18   Ht 5\' 4"  (1.626 m)   Wt 63.5 kg   SpO2 100%   BMI 24.03 kg/m   Physical Exam Vitals and nursing note reviewed.  Constitutional:      General: She is not in acute distress.    Appearance: Normal appearance. She is not diaphoretic.  HENT:     Head: Normocephalic and atraumatic.     Nose: Nose normal.  Eyes:     Conjunctiva/sclera: Conjunctivae normal.     Pupils: Pupils are equal, round, and reactive to light.  Cardiovascular:     Rate and Rhythm: Normal rate and regular rhythm.     Pulses: Normal pulses.     Heart sounds: Normal heart sounds.  Pulmonary:     Effort: Pulmonary effort is normal.     Breath sounds: Normal breath sounds.  Abdominal:     General: Abdomen is flat. Bowel sounds are normal.     Palpations: Abdomen is soft.     Tenderness: There is no abdominal tenderness. There is no guarding or rebound.  Musculoskeletal:        General: Normal range of motion.     Cervical back: Normal range of motion and neck supple.     Right lower leg: No edema.     Left lower leg: No edema.  Skin:    General: Skin is warm and dry.     Capillary Refill: Capillary refill  takes less than 2 seconds.  Neurological:     General: No focal deficit present.     Mental Status: She is alert and oriented to person, place, and time.     Deep Tendon Reflexes: Reflexes normal.  Psychiatric:        Mood and Affect: Mood normal.        Behavior: Behavior normal.     ED Results / Procedures / Treatments   Labs (all labs ordered are listed, but only abnormal results  are displayed) Results for orders placed or performed during the hospital encounter of 07/26/20  CBC with Differential/Platelet  Result Value Ref Range   WBC 7.5 4.0 - 10.5 K/uL   RBC 4.10 3.87 - 5.11 MIL/uL   Hemoglobin 12.4 12.0 - 15.0 g/dL   HCT 37.6 36.0 - 46.0 %   MCV 91.7 80.0 - 100.0 fL   MCH 30.2 26.0 - 34.0 pg   MCHC 33.0 30.0 - 36.0 g/dL   RDW 12.4 11.5 - 15.5 %   Platelets 244 150 - 400 K/uL   nRBC 0.0 0.0 - 0.2 %   Neutrophils Relative % 42 %   Neutro Abs 3.1 1.7 - 7.7 K/uL   Lymphocytes Relative 44 %   Lymphs Abs 3.3 0.7 - 4.0 K/uL   Monocytes Relative 10 %   Monocytes Absolute 0.8 0.1 - 1.0 K/uL   Eosinophils Relative 3 %   Eosinophils Absolute 0.3 0.0 - 0.5 K/uL   Basophils Relative 1 %   Basophils Absolute 0.1 0.0 - 0.1 K/uL   Immature Granulocytes 0 %   Abs Immature Granulocytes 0.01 0.00 - 0.07 K/uL  Basic metabolic panel  Result Value Ref Range   Sodium 137 135 - 145 mmol/L   Potassium 3.8 3.5 - 5.1 mmol/L   Chloride 102 98 - 111 mmol/L   CO2 27 22 - 32 mmol/L   Glucose, Bld 123 (H) 70 - 99 mg/dL   BUN 26 (H) 8 - 23 mg/dL   Creatinine, Ser 0.85 0.44 - 1.00 mg/dL   Calcium 9.6 8.9 - 10.3 mg/dL   GFR, Estimated >60 >60 mL/min   Anion gap 8 5 - 15  Troponin I (High Sensitivity)  Result Value Ref Range   Troponin I (High Sensitivity) 2 <18 ng/L   DG Chest Portable 1 View  Result Date: 07/26/2020 CLINICAL DATA:  Pain.  Recurrent bilateral chest pain. EXAM: PORTABLE CHEST 1 VIEW COMPARISON:  Radiograph 10/23/2019 FINDINGS: The cardiomediastinal contours are normal.  Atherosclerosis of the aortic arch. Pulmonary vasculature is normal. No consolidation, pleural effusion, or pneumothorax. No acute osseous abnormalities are seen. IMPRESSION: No acute chest findings. Electronically Signed   By: Keith Rake M.D.   On: 07/26/2020 23:16    EKG  Date: 07/27/2020  Rate:68  Rhythm: normal sinus rhythm  QRS Axis: normal  Intervals: normal  ST/T Wave abnormalities: normal  Conduction Disutrbances: none  Narrative Interpretation: unremarkable    Radiology DG Chest Portable 1 View  Result Date: 07/26/2020 CLINICAL DATA:  Pain.  Recurrent bilateral chest pain. EXAM: PORTABLE CHEST 1 VIEW COMPARISON:  Radiograph 10/23/2019 FINDINGS: The cardiomediastinal contours are normal. Atherosclerosis of the aortic arch. Pulmonary vasculature is normal. No consolidation, pleural effusion, or pneumothorax. No acute osseous abnormalities are seen. IMPRESSION: No acute chest findings. Electronically Signed   By: Keith Rake M.D.   On: 07/26/2020 23:16    Procedures Procedures   Medications Ordered in ED Medications  alum & mag hydroxide-simeth (MAALOX/MYLANTA) 200-200-20 MG/5ML suspension 30 mL (30 mLs Oral Given 07/26/20 2316)    And  lidocaine (XYLOCAINE) 2 % viscous mouth solution 15 mL (15 mLs Oral Given 07/26/20 2316)    ED Course  I have reviewed the triage vital signs and the nursing notes.  Pertinent labs & imaging results that were available during my care of the patient were reviewed by me and considered in my medical decision making (see chart for details).   I do not believe this is a PE.  The pain is not pleuritic.  There is no SOB and no risk for PE.  HEART score is 2, symptoms are highly atypical for cardiac etiology. Most consistent with GERD.  Will start PPI and gerd friendly diet.  Ruled out for MI in the ED with normal EKG and 2 negative troponins.  Stable for discharge with close follow up.   Doral Terilyn Sano was evaluated in Emergency  Department on 07/27/2020 for the symptoms described in the history of present illness. She was evaluated in the context of the global COVID-19 pandemic, which necessitated consideration that the patient might be at risk for infection with the SARS-CoV-2 virus that causes COVID-19. Institutional protocols and algorithms that pertain to the evaluation of patients at risk for COVID-19 are in a state of rapid change based on information released by regulatory bodies including the CDC and federal and state organizations. These policies and algorithms were followed during the patient's care in the ED.  Final Clinical Impression(s) / ED Diagnoses Return for intractable cough, coughing up blood, fevers >100.4 unrelieved by medication, shortness of breath, intractable vomiting, chest pain, shortness of breath, weakness, numbness, changes in speech, facial asymmetry, abdominal pain, passing out, Inability to tolerate liquids or food, cough, altered mental status or any concerns. No signs of systemic illness or infection. The patient is nontoxic-appearing on exam and vital signs are within normal limits.  I have reviewed the triage vital signs and the nursing notes. Pertinent labs & imaging results that were available during my care of the patient were reviewed by me and considered in my medical decision making (see chart for details). After history, exam, and medical workup I feel the patient has been appropriately medically screened and is safe for discharge home. Pertinent diagnoses were discussed with the patient. Patient was given return precautions.      Ilaisaane Marts, MD 07/27/20 1610    Randal Buba, Ramzy Cappelletti, MD 07/27/20 0110

## 2020-07-27 ENCOUNTER — Encounter (HOSPITAL_BASED_OUTPATIENT_CLINIC_OR_DEPARTMENT_OTHER): Payer: Self-pay | Admitting: Emergency Medicine

## 2020-07-27 LAB — TROPONIN I (HIGH SENSITIVITY): Troponin I (High Sensitivity): 2 ng/L (ref <18)

## 2020-07-27 MED ORDER — OMEPRAZOLE 20 MG PO CPDR: 20.0000 mg | DELAYED_RELEASE_CAPSULE | Freq: Every day | ORAL | 0 refills | Status: DC

## 2020-07-27 NOTE — ED Notes (Signed)
Pt verbalizes understanding of discharge instructions. Opportunity for questioning and answers were provided. Armand removed by staff, pt discharged from ED ambulatory to Home with Family.

## 2020-07-31 DIAGNOSIS — H524 Presbyopia: Secondary | ICD-10-CM | POA: Diagnosis not present

## 2020-07-31 DIAGNOSIS — H2513 Age-related nuclear cataract, bilateral: Secondary | ICD-10-CM | POA: Diagnosis not present

## 2020-07-31 DIAGNOSIS — H5213 Myopia, bilateral: Secondary | ICD-10-CM | POA: Diagnosis not present

## 2020-07-31 DIAGNOSIS — H35373 Puckering of macula, bilateral: Secondary | ICD-10-CM | POA: Diagnosis not present

## 2020-07-31 DIAGNOSIS — H04123 Dry eye syndrome of bilateral lacrimal glands: Secondary | ICD-10-CM | POA: Diagnosis not present

## 2020-07-31 DIAGNOSIS — H52223 Regular astigmatism, bilateral: Secondary | ICD-10-CM | POA: Diagnosis not present

## 2020-07-31 DIAGNOSIS — H43811 Vitreous degeneration, right eye: Secondary | ICD-10-CM | POA: Diagnosis not present

## 2020-08-15 DIAGNOSIS — G4730 Sleep apnea, unspecified: Secondary | ICD-10-CM | POA: Diagnosis not present

## 2020-08-15 DIAGNOSIS — R7301 Impaired fasting glucose: Secondary | ICD-10-CM | POA: Diagnosis not present

## 2020-08-15 DIAGNOSIS — E782 Mixed hyperlipidemia: Secondary | ICD-10-CM | POA: Diagnosis not present

## 2020-08-15 DIAGNOSIS — F329 Major depressive disorder, single episode, unspecified: Secondary | ICD-10-CM | POA: Diagnosis not present

## 2020-08-15 DIAGNOSIS — I10 Essential (primary) hypertension: Secondary | ICD-10-CM | POA: Diagnosis not present

## 2020-08-23 ENCOUNTER — Other Ambulatory Visit: Payer: Self-pay

## 2020-08-23 ENCOUNTER — Ambulatory Visit
Admission: RE | Admit: 2020-08-23 | Discharge: 2020-08-23 | Disposition: A | Discharge status: Home / Self Care | Payer: PPO | Source: Ambulatory Visit | Attending: Family Medicine | Admitting: Family Medicine

## 2020-08-23 DIAGNOSIS — Z1231 Encounter for screening mammogram for malignant neoplasm of breast: Secondary | ICD-10-CM | POA: Diagnosis not present

## 2020-09-25 DIAGNOSIS — F332 Major depressive disorder, recurrent severe without psychotic features: Secondary | ICD-10-CM | POA: Diagnosis not present

## 2020-09-26 DIAGNOSIS — M13841 Other specified arthritis, right hand: Secondary | ICD-10-CM | POA: Diagnosis not present

## 2020-09-26 DIAGNOSIS — M65342 Trigger finger, left ring finger: Secondary | ICD-10-CM | POA: Diagnosis not present

## 2020-12-12 DIAGNOSIS — M6530 Trigger finger, unspecified finger: Secondary | ICD-10-CM | POA: Diagnosis not present

## 2020-12-12 DIAGNOSIS — M13841 Other specified arthritis, right hand: Secondary | ICD-10-CM | POA: Diagnosis not present

## 2021-01-29 DIAGNOSIS — E782 Mixed hyperlipidemia: Secondary | ICD-10-CM | POA: Diagnosis not present

## 2021-01-29 DIAGNOSIS — F329 Major depressive disorder, single episode, unspecified: Secondary | ICD-10-CM | POA: Diagnosis not present

## 2021-01-29 DIAGNOSIS — R7301 Impaired fasting glucose: Secondary | ICD-10-CM | POA: Diagnosis not present

## 2021-01-29 DIAGNOSIS — I10 Essential (primary) hypertension: Secondary | ICD-10-CM | POA: Diagnosis not present

## 2021-01-29 DIAGNOSIS — G4730 Sleep apnea, unspecified: Secondary | ICD-10-CM | POA: Diagnosis not present

## 2021-01-29 DIAGNOSIS — Z0000 Encounter for general adult medical examination without abnormal findings: Secondary | ICD-10-CM | POA: Diagnosis not present

## 2021-01-29 DIAGNOSIS — Z23 Encounter for immunization: Secondary | ICD-10-CM | POA: Diagnosis not present

## 2021-01-30 DIAGNOSIS — R7301 Impaired fasting glucose: Secondary | ICD-10-CM | POA: Diagnosis not present

## 2021-01-30 DIAGNOSIS — E782 Mixed hyperlipidemia: Secondary | ICD-10-CM | POA: Diagnosis not present

## 2021-02-03 ENCOUNTER — Other Ambulatory Visit: Payer: Self-pay | Admitting: Family Medicine

## 2021-02-03 DIAGNOSIS — E2839 Other primary ovarian failure: Secondary | ICD-10-CM

## 2021-02-14 ENCOUNTER — Other Ambulatory Visit: Payer: PPO

## 2021-02-20 ENCOUNTER — Other Ambulatory Visit: Payer: PPO

## 2021-02-27 ENCOUNTER — Other Ambulatory Visit: Payer: Self-pay

## 2021-02-27 ENCOUNTER — Ambulatory Visit
Admission: RE | Admit: 2021-02-27 | Discharge: 2021-02-27 | Disposition: A | Discharge status: Home / Self Care | Payer: PPO | Source: Ambulatory Visit | Attending: Family Medicine | Admitting: Family Medicine

## 2021-02-27 DIAGNOSIS — E2839 Other primary ovarian failure: Secondary | ICD-10-CM

## 2021-02-27 DIAGNOSIS — Z780 Asymptomatic menopausal state: Secondary | ICD-10-CM | POA: Diagnosis not present

## 2021-02-27 DIAGNOSIS — M85852 Other specified disorders of bone density and structure, left thigh: Secondary | ICD-10-CM | POA: Diagnosis not present

## 2021-03-25 DIAGNOSIS — F332 Major depressive disorder, recurrent severe without psychotic features: Secondary | ICD-10-CM | POA: Diagnosis not present

## 2021-03-27 ENCOUNTER — Ambulatory Visit: Payer: PPO | Admitting: Interventional Cardiology

## 2021-04-02 ENCOUNTER — Encounter: Payer: Self-pay | Admitting: Internal Medicine

## 2021-04-02 ENCOUNTER — Ambulatory Visit: Payer: PPO | Admitting: Internal Medicine

## 2021-04-02 ENCOUNTER — Other Ambulatory Visit: Payer: Self-pay

## 2021-04-02 VITALS — BP 131/75 | HR 86 | Ht 64.0 in | Wt 137.8 lb

## 2021-04-02 DIAGNOSIS — T466X5A Adverse effect of antihyperlipidemic and antiarteriosclerotic drugs, initial encounter: Secondary | ICD-10-CM

## 2021-04-02 DIAGNOSIS — R931 Abnormal findings on diagnostic imaging of heart and coronary circulation: Secondary | ICD-10-CM | POA: Diagnosis not present

## 2021-04-02 DIAGNOSIS — E7849 Other hyperlipidemia: Secondary | ICD-10-CM

## 2021-04-02 DIAGNOSIS — M791 Myalgia, unspecified site: Secondary | ICD-10-CM

## 2021-04-02 DIAGNOSIS — T466X5D Adverse effect of antihyperlipidemic and antiarteriosclerotic drugs, subsequent encounter: Secondary | ICD-10-CM | POA: Diagnosis not present

## 2021-04-02 NOTE — Patient Instructions (Signed)
Medication Instructions:  Continue same medications   Lab Work: None ordered   Testing/Procedures: None ordered   Follow-Up: At [facility], you and your health needs are our priority.  As part of our continuing mission to provide you with exceptional heart care, we have created designated Provider Care Teams.  These Care Teams include your primary Cardiologist (physician) and Advanced Practice Providers (APPs -  Physician Assistants and Nurse Practitioners) who all work together to provide you with the care you need, when you need it.  We recommend signing up for the patient portal called "MyChart".  Sign up information is provided on this After Visit Summary.  MyChart is used to connect with patients for Virtual Visits (Telemedicine).  Patients are able to view lab/test results, encounter notes, upcoming appointments, etc.  Non-urgent messages can be sent to your provider as well.   To learn more about what you can do with MyChart, go to [email or web address].      Your next appointment:  As Needed    The format for your next appointment: Office    Provider:  Dr.Hilty

## 2021-04-02 NOTE — Progress Notes (Signed)
LIPID CLINIC CONSULT NOTE  Chief Complaint:  Manage dyslipidemia  Primary Care Physician: Hulan Fess, MD  Primary Cardiologist:  None  HPI:  Kayla Deleon is a 70 y.o. female who is being seen today for the evaluation of dyslipidemia at the request of Marda Stalker, [place].  This is a pleasant 70 year old female who is cause and is a patient of mine with high cholesterol.  Her father died of MI at age 80 and had high cholesterol and she has 3 brothers who have high cholesterol as well.  There is concern for familial hyperlipidemia.  Most recently her lipid profile showed total cholesterol 261, triglycerides 180, HDL 53 and LDL of 175.  Non-HDL cholesterol 208.  Hemoglobin A1c was 6.2.  Unfortunately, she could not tolerate statin medications.  She has been on a number of them over the years which caused severe myalgias.  She is referred for evaluation and possible PCSK9 inhibitor.  She reports a very healthy diet low in saturated fat and it stays physically active.  At one point her lowest cholesterol numbers were when she weighed about 120 pounds however she has not been at that weight for several years.  04/02/2021  Kayla Deleon returns today for follow-up.  Her cholesterol remains elevated.  Recent labs showed total cholesterol was 234, HDL 49, LDL 158 and triglycerides 146.  This is improved somewhat.  We had recommended a PCSK9 inhibitor however she is very hesitant about it.  She wanted to try atorvastatin 40 mg but had side effects similar to prior statins.  She was then placed on ezetimibe which I think is helped lower the numbers somewhat.  She is also made some dietary changes, he is exercising more and has had some weight loss.  She is also now on some losartan and has good blood pressure control.  I am concerned about persistently elevated risk.  She does have a calcium score of 117, this was 81st percentile for age and sex matched controls, suggesting age advanced coronary  disease.   PMHx:  Past Medical History:  Diagnosis Date   Anxiety    Cervical spondylosis    evaluated in the past by neurossurgeon   Depression    Fibroids    in the past    Hypercholesteremia    Hypertension    Mental disorder    Sleep apnea    on CPAP    Past Surgical History:  Procedure Laterality Date   CESAREAN SECTION     CHOLECYSTECTOMY      FAMHx:  Family History  Problem Relation Age of Onset   Other Mother        brain aneurysm   Heart attack Father    Hypertension Father    Hyperlipidemia Father    Hyperlipidemia Brother    Gout Brother    Neuropathy Neg Hx     SOCHx:   reports that she quit smoking about 45 years ago. Her smoking use included cigarettes. She has a 0.10 pack-year smoking history. She has never used smokeless tobacco. She reports current alcohol use. She reports that she does not use drugs.  ALLERGIES:  Allergies  Allergen Reactions   Shellfish Allergy Anaphylaxis    Pt. Uses Epipen.   Statins Other (See Comments)    Severe muscle pain   Flagyl [Metronidazole Hcl] Rash    ROS: Pertinent items noted in HPI and remainder of comprehensive ROS otherwise negative.  HOME MEDS: Current Outpatient Medications on File Prior to  Visit  Medication Sig Dispense Refill   buPROPion (WELLBUTRIN SR) 200 MG 12 hr tablet Take 200 mg by mouth daily.     CLODERM 0.1 % cream Apply 1 application topically 2 (two) times daily as needed. Reported on 10/26/2015     ELDERBERRY PO Take by mouth.     escitalopram (LEXAPRO) 20 MG tablet Take 1 tablet (20 mg total) by mouth daily. For depression and anxiety. 30 tablet 0   ezetimibe (ZETIA) 10 MG tablet Take 10 mg by mouth daily.     ibuprofen (ADVIL,MOTRIN) 200 MG tablet Take 400 mg by mouth every 6 (six) hours as needed. For headache     lamoTRIgine (LAMICTAL) 25 MG tablet Take 50 mg by mouth daily.     losartan (COZAAR) 25 MG tablet Take 25 mg by mouth every morning.     Multiple Vitamin (MULTIVITAMIN  WITH MINERALS) TABS Take 1 tablet by mouth daily.     Omega-3 Fatty Acids (FISH OIL PO) Take 2 capsules by mouth daily.     No current facility-administered medications on file prior to visit.    LABS/IMAGING: No results found for this or any previous visit (from the past 48 hour(s)). No results found.  LIPID PANEL: No results found for: CHOL, TRIG, HDL, CHOLHDL, VLDL, LDLCALC, LDLDIRECT  WEIGHTS: Wt Readings from Last 3 Encounters:  07/26/20 140 lb (63.5 kg)  10/09/19 141 lb 12.8 oz (64.3 kg)  04/15/17 136 lb 9.6 oz (62 kg)    VITALS: BP 131/75   Pulse 86   Ht 5\' 4"  (1.626 m)   SpO2 98%   BMI 24.03 kg/m   EXAM: Deferred  EKG: Deferred  ASSESSMENT: Probable familial hyperlipidemia Statin intolerance-myalgias Impaired fasting glucose-hemoglobin A1c 6.2 Strong family history of premature coronary disease Hypertension OSA on CPAP  PLAN: 1.   Kayla Deleon could not tolerate statins.  She is now on ezetimibe with minimal improvement in her cholesterol.  I would submit to try to target her LDL below 70 and the best option is a PCSK9 inhibitor however cost and her hesitancy to use it may be an issue.  I encouraged her to reach out to me if she changes her mind regarding therapy and we can go ahead and try to preauthorize it.  Otherwise would continue ezetimibe per her PCP and follow-up with me as needed  Pixie Casino, MD, [facility], [place] Director of the Advanced Lipid Disorders &  Cardiovascular Risk Reduction Clinic Diplomate of the American Board of Clinical Lipidology Attending Cardiologist  Direct Dial: [phone]  Fax: [phone]  Website:  [email or web address]  Kayla Deleon Kayla Deleon 04/02/2021, 1:40 PM

## 2021-04-04 ENCOUNTER — Telehealth: Payer: Self-pay | Admitting: Internal Medicine

## 2021-04-04 MED ORDER — REPATHA SURECLICK 140 MG/ML ~~LOC~~ SOAJ: 1.0000 | SUBCUTANEOUS | 11 refills | Status: DC

## 2021-04-04 NOTE — Telephone Encounter (Signed)
Spoke to pt. She states she seen Dr. Debara Pickett on 11/16 and was advised to call back if sh wanted to go through with the [facility] prescription. Will forward to Dr. Debara Pickett and his nurse.

## 2021-04-04 NOTE — Telephone Encounter (Signed)
Spoke with patient of Dr. Debara Pickett - seen 04/02/21. She would like to try Repatha Sureclick. Advised will work on approval with her insurance company, [employer]. Sent her MyChart message w/info on [employer]

## 2021-04-04 NOTE — Telephone Encounter (Signed)
PA Case: [id number], Status: Approved, Coverage Starts on: 04/04/2021 12:00:00 AM, Coverage Ends on: 04/04/2022 12:00:00 AM.

## 2021-04-04 NOTE — Telephone Encounter (Signed)
Pt is returning call regarding a prescription for Repatha. Please advise pt further

## 2021-04-04 NOTE — Addendum Note (Signed)
Addended by: Fidel Levy on: 04/04/2021 02:03 PM   Modules accepted: Orders

## 2021-04-04 NOTE — Telephone Encounter (Signed)
PA for Repatha Sureclick submitted via [place] (Key: [id number])

## 2021-06-26 ENCOUNTER — Other Ambulatory Visit: Payer: Self-pay

## 2021-06-26 ENCOUNTER — Ambulatory Visit: Payer: PPO | Admitting: Internal Medicine

## 2021-06-26 ENCOUNTER — Encounter: Payer: Self-pay | Admitting: Internal Medicine

## 2021-06-26 VITALS — BP 112/64 | HR 79 | Ht 64.0 in | Wt 138.8 lb

## 2021-06-26 DIAGNOSIS — R931 Abnormal findings on diagnostic imaging of heart and coronary circulation: Secondary | ICD-10-CM

## 2021-06-26 DIAGNOSIS — M791 Myalgia, unspecified site: Secondary | ICD-10-CM

## 2021-06-26 DIAGNOSIS — E7849 Other hyperlipidemia: Secondary | ICD-10-CM

## 2021-06-26 DIAGNOSIS — T466X5A Adverse effect of antihyperlipidemic and antiarteriosclerotic drugs, initial encounter: Secondary | ICD-10-CM | POA: Diagnosis not present

## 2021-06-26 NOTE — Progress Notes (Signed)
LIPID CLINIC CONSULT NOTE  Chief Complaint:  Follow-up dyslipidemia  Primary Care Physician: Hulan Fess, MD  Primary Cardiologist:  None  HPI:  Kayla Deleon is a 71 y.o. female who is being seen today for the evaluation of dyslipidemia at the request of Little, Lennette Bihari, MD.  This is a pleasant 71 year old female who is cause and is a patient of mine with high cholesterol.  Her father died of MI at age 69 and had high cholesterol and she has 3 brothers who have high cholesterol as well.  There is concern for familial hyperlipidemia.  Most recently her lipid profile showed total cholesterol 261, triglycerides 180, HDL 53 and LDL of 175.  Non-HDL cholesterol 208.  Hemoglobin A1c was 6.2.  Unfortunately, she could not tolerate statin medications.  She has been on a number of them over the years which caused severe myalgias.  She is referred for evaluation and possible PCSK9 inhibitor.  She reports a very healthy diet low in saturated fat and it stays physically active.  At one point her lowest cholesterol numbers were when she weighed about 120 pounds however she has not been at that weight for several years.  04/02/2021  Kayla Deleon returns today for follow-up.  Her cholesterol remains elevated.  Recent labs showed total cholesterol was 234, HDL 49, LDL 158 and triglycerides 146.  This is improved somewhat.  We had recommended a PCSK9 inhibitor however she is very hesitant about it.  She wanted to try atorvastatin 40 mg but had side effects similar to prior statins.  She was then placed on ezetimibe which I think is helped lower the numbers somewhat.  She is also made some dietary changes, he is exercising more and has had some weight loss.  She is also now on some losartan and has good blood pressure control.  I am concerned about persistently elevated risk.  She does have a calcium score of 117, this was 81st percentile for age and sex matched controls, suggesting age advanced coronary disease.    06/26/2021  Kayla Deleon is seen today in follow-up.  She decided to go with Repatha back in the fall.  She was approved for Repatha in November and has been taking it since then.  She has tolerated very well without any of the severe muscle ache she had with the statins.  She has not had her cholesterol reassessed.  She also maintains on Zetia.  PMHx:  Past Medical History:  Diagnosis Date   Anxiety    Cervical spondylosis    evaluated in the past by neurossurgeon   Depression    Fibroids    in the past    Hypercholesteremia    Hypertension    Mental disorder    Sleep apnea    on CPAP    Past Surgical History:  Procedure Laterality Date   CESAREAN SECTION     CHOLECYSTECTOMY      FAMHx:  Family History  Problem Relation Age of Onset   Other Mother        brain aneurysm   Heart attack Father    Hypertension Father    Hyperlipidemia Father    Hyperlipidemia Brother    Gout Brother    Neuropathy Neg Hx     SOCHx:   reports that she quit smoking about 46 years ago. Her smoking use included cigarettes. She has a 0.10 pack-year smoking history. She has never used smokeless tobacco. She reports current alcohol use. She reports  that she does not use drugs.  ALLERGIES:  Allergies  Allergen Reactions   Shellfish Allergy Anaphylaxis and Rash    Pt. Uses Epipen. Pt. Uses Epipen.    Statins Other (See Comments)    Severe muscle pain   Flagyl [Metronidazole Hcl] Rash    ROS: Pertinent items noted in HPI and remainder of comprehensive ROS otherwise negative.  HOME MEDS: Current Outpatient Medications on File Prior to Visit  Medication Sig Dispense Refill   buPROPion (WELLBUTRIN SR) 200 MG 12 hr tablet Take 200 mg by mouth daily.     CLODERM 0.1 % cream Apply 1 application topically 2 (two) times daily as needed. Reported on 10/26/2015     ELDERBERRY PO Take by mouth.     EPINEPHrine 0.3 mg/0.3 mL IJ SOAJ injection USE AS DIRECTED FOR SHRIMP ALLERGY     escitalopram  (LEXAPRO) 20 MG tablet Take 1 tablet (20 mg total) by mouth daily. For depression and anxiety. 30 tablet 0   Evolocumab (REPATHA SURECLICK) 106 MG/ML SOAJ Inject 1 Dose into the skin every 14 (fourteen) days. 2 mL 11   ezetimibe (ZETIA) 10 MG tablet Take 10 mg by mouth daily.     ibuprofen (ADVIL,MOTRIN) 200 MG tablet Take 400 mg by mouth every 6 (six) hours as needed. For headache     lamoTRIgine (LAMICTAL) 25 MG tablet Take 50 mg by mouth daily.     losartan (COZAAR) 25 MG tablet Take 25 mg by mouth every morning.     Multiple Vitamin (MULTIVITAMIN WITH MINERALS) TABS Take 1 tablet by mouth daily.     Omega-3 Fatty Acids (FISH OIL PO) Take 2 capsules by mouth daily.     No current facility-administered medications on file prior to visit.    LABS/IMAGING: No results found for this or any previous visit (from the past 48 hour(s)). No results found.  LIPID PANEL: No results found for: CHOL, TRIG, HDL, CHOLHDL, VLDL, LDLCALC, LDLDIRECT  WEIGHTS: Wt Readings from Last 3 Encounters:  06/26/21 138 lb 12.8 oz (63 kg)  04/02/21 137 lb 12.8 oz (62.5 kg)  07/26/20 140 lb (63.5 kg)    VITALS: BP 112/64    Pulse 79    Ht 5\' 4"  (1.626 m)    Wt 138 lb 12.8 oz (63 kg)    SpO2 98%    BMI 23.82 kg/m   EXAM: Deferred  EKG: Deferred  ASSESSMENT: Probable familial hyperlipidemia Statin intolerance-myalgias Impaired fasting glucose-hemoglobin A1c 6.2 Strong family history of premature coronary disease Hypertension OSA on CPAP  PLAN: 1.   Kayla Deleon approved for Repatha and has been taking it for the past 3 months.  She is in need of a repeat lipid assessment.  We will get an NMR and LP(a) today and contact her with those results.  She will likely need to remain on the [place] if she is at target.  Plan otherwise follow-up annually or sooner as necessary.  She does have approval for Repatha until November 2023.  Pixie Casino, MD, [facility], [place] Director of the Advanced Lipid Disorders &  Cardiovascular Risk Reduction Clinic Diplomate of the American Board of Clinical Lipidology Attending Cardiologist  Direct Dial: [phone]   Fax: [phone]  Website:  [email or web address]  Nadean Corwin Kanyla Omeara 06/26/2021, 9:20 AM

## 2021-06-26 NOTE — Patient Instructions (Signed)
Medication Instructions:  NO CHANGES  *If you need a refill on your cardiac medications before your next appointment, please call your pharmacy*   Lab Work: NMR lipoprofle, LP(a) today   If you have labs (blood work) drawn today and your tests are completely normal, you will receive your results only by: [place] (if you have MyChart) OR A paper copy in the mail If you have any lab test that is abnormal or we need to change your treatment, we will call you to review the results.   Testing/Procedures: NONE   Follow-Up: At [facility], you and your health needs are our priority.  As part of our continuing mission to provide you with exceptional heart care, we have created designated Provider Care Teams.  These Care Teams include your primary Cardiologist (physician) and Advanced Practice Providers (APPs -  Physician Assistants and Nurse Practitioners) who all work together to provide you with the care you need, when you need it.  We recommend signing up for the patient portal called "MyChart".  Sign up information is provided on this After Visit Summary.  MyChart is used to connect with patients for Virtual Visits (Telemedicine).  Patients are able to view lab/test results, encounter notes, upcoming appointments, etc.  Non-urgent messages can be sent to your provider as well.   To learn more about what you can do with MyChart, go to [email or web address].    Your next appointment:   1 year LIPID CLINIC appointment with Dr. Debara Pickett

## 2021-06-27 ENCOUNTER — Encounter: Payer: Self-pay | Admitting: Internal Medicine

## 2021-06-28 LAB — NMR, LIPOPROFILE
Cholesterol, Total: 137 mg/dL (ref 100–199)
HDL Particle Number: 33.9 umol/L (ref >=30.5)
HDL-C: 53 mg/dL (ref >39)
LDL Particle Number: 663 nmol/L (ref <1000)
LDL Size: 20.4 nm — ABNORMAL LOW (ref >20.5)
LDL-C (NIH Calc): 63 mg/dL (ref 0–99)
LP-IR Score: 50 — ABNORMAL HIGH (ref <=45)
Small LDL Particle Number: 271 nmol/L (ref <=527)
Triglycerides: 118 mg/dL (ref 0–149)

## 2021-06-28 LAB — LIPOPROTEIN A (LPA): Lipoprotein (a): 253.2 nmol/L — ABNORMAL HIGH (ref <75.0)

## 2021-08-04 DIAGNOSIS — H43811 Vitreous degeneration, right eye: Secondary | ICD-10-CM | POA: Diagnosis not present

## 2021-08-04 DIAGNOSIS — H04123 Dry eye syndrome of bilateral lacrimal glands: Secondary | ICD-10-CM | POA: Diagnosis not present

## 2021-08-04 DIAGNOSIS — H35373 Puckering of macula, bilateral: Secondary | ICD-10-CM | POA: Diagnosis not present

## 2021-08-04 DIAGNOSIS — H2513 Age-related nuclear cataract, bilateral: Secondary | ICD-10-CM | POA: Diagnosis not present

## 2021-08-13 DIAGNOSIS — R194 Change in bowel habit: Secondary | ICD-10-CM | POA: Diagnosis not present

## 2021-10-01 DIAGNOSIS — F332 Major depressive disorder, recurrent severe without psychotic features: Secondary | ICD-10-CM | POA: Diagnosis not present

## 2021-10-15 DIAGNOSIS — G4733 Obstructive sleep apnea (adult) (pediatric): Secondary | ICD-10-CM | POA: Diagnosis not present

## 2021-10-17 DIAGNOSIS — U071 COVID-19: Secondary | ICD-10-CM | POA: Diagnosis not present

## 2021-10-17 DIAGNOSIS — F339 Major depressive disorder, recurrent, unspecified: Secondary | ICD-10-CM | POA: Diagnosis not present

## 2021-10-17 DIAGNOSIS — I10 Essential (primary) hypertension: Secondary | ICD-10-CM | POA: Diagnosis not present

## 2021-10-31 DIAGNOSIS — G4733 Obstructive sleep apnea (adult) (pediatric): Secondary | ICD-10-CM | POA: Diagnosis not present

## 2021-11-05 DIAGNOSIS — R059 Cough, unspecified: Secondary | ICD-10-CM | POA: Diagnosis not present

## 2021-11-05 DIAGNOSIS — K219 Gastro-esophageal reflux disease without esophagitis: Secondary | ICD-10-CM | POA: Diagnosis not present

## 2021-11-05 DIAGNOSIS — R5383 Other fatigue: Secondary | ICD-10-CM | POA: Diagnosis not present

## 2021-11-05 DIAGNOSIS — R0789 Other chest pain: Secondary | ICD-10-CM | POA: Diagnosis not present

## 2021-11-11 ENCOUNTER — Ambulatory Visit: Payer: PPO | Admitting: Nurse Practitioner

## 2021-11-11 ENCOUNTER — Encounter: Payer: Self-pay | Admitting: Nurse Practitioner

## 2021-11-11 VITALS — BP 130/78 | HR 73 | Ht 64.0 in | Wt 140.6 lb

## 2021-11-11 DIAGNOSIS — G4733 Obstructive sleep apnea (adult) (pediatric): Secondary | ICD-10-CM | POA: Diagnosis not present

## 2021-11-11 DIAGNOSIS — E785 Hyperlipidemia, unspecified: Secondary | ICD-10-CM

## 2021-11-11 DIAGNOSIS — I2510 Atherosclerotic heart disease of native coronary artery without angina pectoris: Secondary | ICD-10-CM

## 2021-11-11 DIAGNOSIS — R072 Precordial pain: Secondary | ICD-10-CM | POA: Diagnosis not present

## 2021-11-11 DIAGNOSIS — I10 Essential (primary) hypertension: Secondary | ICD-10-CM | POA: Diagnosis not present

## 2021-11-11 DIAGNOSIS — R7303 Prediabetes: Secondary | ICD-10-CM

## 2021-11-11 MED ORDER — METOPROLOL TARTRATE 100 MG PO TABS: ORAL_TABLET | ORAL | 0 refills | Status: DC

## 2021-11-25 DIAGNOSIS — G4733 Obstructive sleep apnea (adult) (pediatric): Secondary | ICD-10-CM | POA: Diagnosis not present

## 2021-11-25 DIAGNOSIS — E785 Hyperlipidemia, unspecified: Secondary | ICD-10-CM | POA: Diagnosis not present

## 2021-11-25 DIAGNOSIS — I2510 Atherosclerotic heart disease of native coronary artery without angina pectoris: Secondary | ICD-10-CM | POA: Diagnosis not present

## 2021-11-25 DIAGNOSIS — I10 Essential (primary) hypertension: Secondary | ICD-10-CM | POA: Diagnosis not present

## 2021-11-25 DIAGNOSIS — R7303 Prediabetes: Secondary | ICD-10-CM | POA: Diagnosis not present

## 2021-11-25 DIAGNOSIS — R072 Precordial pain: Secondary | ICD-10-CM | POA: Diagnosis not present

## 2021-11-26 LAB — BASIC METABOLIC PANEL
BUN/Creatinine Ratio: 18 (ref 12–28)
BUN: 15 mg/dL (ref 8–27)
CO2: 23 mmol/L (ref 20–29)
Calcium: 9.6 mg/dL (ref 8.7–10.3)
Chloride: 99 mmol/L (ref 96–106)
Creatinine, Ser: 0.83 mg/dL (ref 0.57–1.00)
Glucose: 79 mg/dL (ref 70–99)
Potassium: 4.8 mmol/L (ref 3.5–5.2)
Sodium: 136 mmol/L (ref 134–144)
eGFR: 77 mL/min/{1.73_m2} (ref >59)

## 2021-12-01 ENCOUNTER — Telehealth (HOSPITAL_COMMUNITY): Payer: Self-pay | Admitting: Emergency Medicine

## 2021-12-01 ENCOUNTER — Telehealth (HOSPITAL_COMMUNITY): Payer: Self-pay | Admitting: *Deleted

## 2021-12-01 NOTE — Telephone Encounter (Signed)
Attempted to call patient regarding upcoming cardiac CT appointment. °Left message on voicemail with name and callback number ° °Tamatha Gadbois RN Navigator Cardiac Imaging °[facility] °[phone] Office °[phone] Cell ° °

## 2021-12-01 NOTE — Telephone Encounter (Signed)
Reaching out to patient to offer assistance regarding upcoming cardiac imaging study; pt verbalizes understanding of appt date/time, parking situation and where to check in, pre-test NPO status and medications ordered, and verified current allergies; name and call back number provided for further questions should they arise Kayla Bond RN Navigator Cardiac Imaging [facility] [phone] office [phone] cell  '100mg'$  metoprolol  Arrival 300 Denies Iv issues

## 2021-12-02 ENCOUNTER — Other Ambulatory Visit: Payer: Self-pay

## 2021-12-02 ENCOUNTER — Telehealth: Payer: Self-pay | Admitting: Nurse Practitioner

## 2021-12-02 ENCOUNTER — Emergency Department (HOSPITAL_BASED_OUTPATIENT_CLINIC_OR_DEPARTMENT_OTHER): Payer: PPO | Admitting: Radiology

## 2021-12-02 ENCOUNTER — Emergency Department (HOSPITAL_BASED_OUTPATIENT_CLINIC_OR_DEPARTMENT_OTHER): Payer: PPO

## 2021-12-02 ENCOUNTER — Encounter (HOSPITAL_BASED_OUTPATIENT_CLINIC_OR_DEPARTMENT_OTHER): Payer: Self-pay | Admitting: Emergency Medicine

## 2021-12-02 ENCOUNTER — Ambulatory Visit (HOSPITAL_COMMUNITY)
Admission: RE | Admit: 2021-12-02 | Discharge: 2021-12-02 | Disposition: A | Discharge status: Home / Self Care | Payer: PPO | Source: Ambulatory Visit | Attending: Nurse Practitioner | Admitting: Nurse Practitioner

## 2021-12-02 ENCOUNTER — Inpatient Hospital Stay (HOSPITAL_BASED_OUTPATIENT_CLINIC_OR_DEPARTMENT_OTHER)
Admission: EM | Admit: 2021-12-02 | Discharge: 2021-12-06 | DRG: 286 | Disposition: A | Discharge status: Home / Self Care | Payer: PPO | Attending: Cardiovascular Disease | Admitting: Cardiovascular Disease

## 2021-12-02 DIAGNOSIS — R931 Abnormal findings on diagnostic imaging of heart and coronary circulation: Secondary | ICD-10-CM | POA: Diagnosis present

## 2021-12-02 DIAGNOSIS — Z8349 Family history of other endocrine, nutritional and metabolic diseases: Secondary | ICD-10-CM | POA: Diagnosis not present

## 2021-12-02 DIAGNOSIS — R079 Chest pain, unspecified: Secondary | ICD-10-CM | POA: Diagnosis not present

## 2021-12-02 DIAGNOSIS — R072 Precordial pain: Secondary | ICD-10-CM | POA: Diagnosis not present

## 2021-12-02 DIAGNOSIS — G929 Unspecified toxic encephalopathy: Secondary | ICD-10-CM | POA: Diagnosis not present

## 2021-12-02 DIAGNOSIS — Z881 Allergy status to other antibiotic agents status: Secondary | ICD-10-CM

## 2021-12-02 DIAGNOSIS — E782 Mixed hyperlipidemia: Secondary | ICD-10-CM | POA: Diagnosis not present

## 2021-12-02 DIAGNOSIS — Z87891 Personal history of nicotine dependence: Secondary | ICD-10-CM | POA: Diagnosis not present

## 2021-12-02 DIAGNOSIS — F05 Delirium due to known physiological condition: Secondary | ICD-10-CM | POA: Diagnosis present

## 2021-12-02 DIAGNOSIS — I5181 Takotsubo syndrome: Secondary | ICD-10-CM | POA: Diagnosis present

## 2021-12-02 DIAGNOSIS — F411 Generalized anxiety disorder: Secondary | ICD-10-CM | POA: Diagnosis present

## 2021-12-02 DIAGNOSIS — F339 Major depressive disorder, recurrent, unspecified: Secondary | ICD-10-CM | POA: Diagnosis present

## 2021-12-02 DIAGNOSIS — I447 Left bundle-branch block, unspecified: Secondary | ICD-10-CM | POA: Diagnosis present

## 2021-12-02 DIAGNOSIS — Z20822 Contact with and (suspected) exposure to covid-19: Secondary | ICD-10-CM | POA: Diagnosis present

## 2021-12-02 DIAGNOSIS — I10 Essential (primary) hypertension: Secondary | ICD-10-CM | POA: Diagnosis present

## 2021-12-02 DIAGNOSIS — Z79899 Other long term (current) drug therapy: Secondary | ICD-10-CM

## 2021-12-02 DIAGNOSIS — E871 Hypo-osmolality and hyponatremia: Secondary | ICD-10-CM

## 2021-12-02 DIAGNOSIS — F3341 Major depressive disorder, recurrent, in partial remission: Secondary | ICD-10-CM | POA: Diagnosis not present

## 2021-12-02 DIAGNOSIS — J9601 Acute respiratory failure with hypoxia: Secondary | ICD-10-CM | POA: Diagnosis present

## 2021-12-02 DIAGNOSIS — E7849 Other hyperlipidemia: Secondary | ICD-10-CM | POA: Diagnosis present

## 2021-12-02 DIAGNOSIS — I2510 Atherosclerotic heart disease of native coronary artery without angina pectoris: Secondary | ICD-10-CM | POA: Diagnosis present

## 2021-12-02 DIAGNOSIS — Z781 Physical restraint status: Secondary | ICD-10-CM

## 2021-12-02 DIAGNOSIS — R519 Headache, unspecified: Secondary | ICD-10-CM | POA: Diagnosis not present

## 2021-12-02 DIAGNOSIS — Z91013 Allergy to seafood: Secondary | ICD-10-CM

## 2021-12-02 DIAGNOSIS — K219 Gastro-esophageal reflux disease without esophagitis: Secondary | ICD-10-CM | POA: Diagnosis present

## 2021-12-02 DIAGNOSIS — I214 Non-ST elevation (NSTEMI) myocardial infarction: Principal | ICD-10-CM

## 2021-12-02 DIAGNOSIS — E7800 Pure hypercholesterolemia, unspecified: Secondary | ICD-10-CM | POA: Diagnosis present

## 2021-12-02 DIAGNOSIS — Z888 Allergy status to other drugs, medicaments and biological substances status: Secondary | ICD-10-CM | POA: Diagnosis not present

## 2021-12-02 DIAGNOSIS — Z8616 Personal history of COVID-19: Secondary | ICD-10-CM | POA: Diagnosis not present

## 2021-12-02 DIAGNOSIS — Z8249 Family history of ischemic heart disease and other diseases of the circulatory system: Secondary | ICD-10-CM

## 2021-12-02 DIAGNOSIS — G4733 Obstructive sleep apnea (adult) (pediatric): Secondary | ICD-10-CM | POA: Diagnosis present

## 2021-12-02 DIAGNOSIS — I428 Other cardiomyopathies: Secondary | ICD-10-CM | POA: Diagnosis not present

## 2021-12-02 DIAGNOSIS — E785 Hyperlipidemia, unspecified: Secondary | ICD-10-CM

## 2021-12-02 DIAGNOSIS — I200 Unstable angina: Secondary | ICD-10-CM | POA: Diagnosis not present

## 2021-12-02 LAB — CBC
HCT: 36.1 % (ref 36.0–46.0)
Hemoglobin: 12.2 g/dL (ref 12.0–15.0)
MCH: 30.7 pg (ref 26.0–34.0)
MCHC: 33.8 g/dL (ref 30.0–36.0)
MCV: 90.7 fL (ref 80.0–100.0)
Platelets: 335 10*3/uL (ref 150–400)
RBC: 3.98 MIL/uL (ref 3.87–5.11)
RDW: 12.4 % (ref 11.5–15.5)
WBC: 16.1 10*3/uL — ABNORMAL HIGH (ref 4.0–10.5)
nRBC: 0 % (ref 0.0–0.2)

## 2021-12-02 LAB — COMPREHENSIVE METABOLIC PANEL
ALT: 13 U/L (ref 0–44)
AST: 21 U/L (ref 15–41)
Albumin: 4.3 g/dL (ref 3.5–5.0)
Alkaline Phosphatase: 46 U/L (ref 38–126)
Anion gap: 12 (ref 5–15)
BUN: 22 mg/dL (ref 8–23)
CO2: 20 mmol/L — ABNORMAL LOW (ref 22–32)
Calcium: 9.2 mg/dL (ref 8.9–10.3)
Chloride: 90 mmol/L — ABNORMAL LOW (ref 98–111)
Creatinine, Ser: 0.97 mg/dL (ref 0.44–1.00)
GFR, Estimated: >60 mL/min (ref >60)
Glucose, Bld: 142 mg/dL — ABNORMAL HIGH (ref 70–99)
Potassium: 4.7 mmol/L (ref 3.5–5.1)
Sodium: 122 mmol/L — ABNORMAL LOW (ref 135–145)
Total Bilirubin: 0.5 mg/dL (ref 0.3–1.2)
Total Protein: 7.4 g/dL (ref 6.5–8.1)

## 2021-12-02 LAB — URINALYSIS, ROUTINE W REFLEX MICROSCOPIC
Bilirubin Urine: NEGATIVE
Glucose, UA: NEGATIVE mg/dL
Ketones, ur: NEGATIVE mg/dL
Leukocytes,Ua: NEGATIVE
Nitrite: NEGATIVE
Specific Gravity, Urine: >1.046 — ABNORMAL HIGH (ref 1.005–1.030)
pH: 6.5 (ref 5.0–8.0)

## 2021-12-02 LAB — TROPONIN I (HIGH SENSITIVITY)
Troponin I (High Sensitivity): 54 ng/L — ABNORMAL HIGH (ref <18)
Troponin I (High Sensitivity): 157 ng/L (ref <18)

## 2021-12-02 LAB — LIPASE, BLOOD: Lipase: 49 U/L (ref 11–51)

## 2021-12-02 MED ORDER — SODIUM CHLORIDE 0.9 % IV BOLUS
500.0000 mL | Freq: Once | INTRAVENOUS | Status: AC
Start: 2021-12-02 — End: 2021-12-02
  Administered 2021-12-02: 500 mL via INTRAVENOUS

## 2021-12-02 MED ORDER — HEPARIN (PORCINE) 25000 UT/250ML-% IV SOLN
800.0000 [IU]/h | INTRAVENOUS | Status: DC
  Administered 2021-12-02: 800 [IU]/h via INTRAVENOUS
  Filled 2021-12-02: qty 250

## 2021-12-02 MED ORDER — FENTANYL CITRATE PF 50 MCG/ML IJ SOSY
50.0000 ug | PREFILLED_SYRINGE | Freq: Once | INTRAMUSCULAR | Status: AC
  Administered 2021-12-02: 50 ug via INTRAVENOUS
  Filled 2021-12-02: qty 1

## 2021-12-02 MED ORDER — HEPARIN BOLUS VIA INFUSION
3800.0000 [IU] | Freq: Once | INTRAVENOUS | Status: AC
  Administered 2021-12-02: 3800 [IU] via INTRAVENOUS

## 2021-12-02 MED ORDER — METOPROLOL TARTRATE 100 MG PO TABS: ORAL_TABLET | ORAL | 0 refills | Status: DC

## 2021-12-02 MED ORDER — IOHEXOL 350 MG/ML SOLN
100.0000 mL | Freq: Once | INTRAVENOUS | Status: AC | PRN
  Administered 2021-12-02: 100 mL via INTRAVENOUS

## 2021-12-02 MED ORDER — NITROGLYCERIN 0.4 MG SL SUBL
0.8000 mg | SUBLINGUAL_TABLET | Freq: Once | SUBLINGUAL | Status: AC
  Administered 2021-12-02: 0.8 mg via SUBLINGUAL

## 2021-12-02 MED ORDER — ONDANSETRON HCL 4 MG/2ML IJ SOLN
4.0000 mg | Freq: Once | INTRAMUSCULAR | Status: AC
  Administered 2021-12-02: 4 mg via INTRAVENOUS
  Filled 2021-12-02: qty 2

## 2021-12-02 MED ORDER — NITROGLYCERIN 0.4 MG SL SUBL
SUBLINGUAL_TABLET | SUBLINGUAL | Status: AC
  Filled 2021-12-02: qty 2

## 2021-12-02 NOTE — Progress Notes (Signed)
ANTICOAGULATION CONSULT NOTE - Initial Consult  Pharmacy Consult for heparin Indication: chest pain/ACS  Allergies  Allergen Reactions   Shellfish Allergy Anaphylaxis and Rash    Pt. Uses Epipen. Pt. Uses Epipen.    Statins Other (See Comments)    Severe muscle pain   Flagyl [Metronidazole Hcl] Rash    Patient Measurements: Height: '5\' 4"'$  (162.6 cm) Weight: 63.5 kg (140 lb) IBW/kg (Calculated) : 54.7 Heparin Dosing Weight: TBW  Vital Signs: Temp: 98.2 F (36.8 C) (07/18 1826) Temp Source: Oral (07/18 1826) BP: 158/100 (07/18 2100) Pulse Rate: 61 (07/18 2100)  Labs: Recent Labs    12/02/21 1825 12/02/21 2035  HGB 12.2  --   HCT 36.1  --   PLT 335  --   CREATININE 0.97  --   TROPONINIHS 54* 157*    Estimated Creatinine Clearance: 47.9 mL/min (by C-G formula based on SCr of 0.97 mg/dL).   Medical History: Past Medical History:  Diagnosis Date   Anxiety    Cervical spondylosis    evaluated in the past by neurossurgeon   Depression    Fibroids    in the past    Hypercholesteremia    Hypertension    Mental disorder    Sleep apnea    on CPAP    Assessment: 73 YOF presenting with N/V and hypertension, elevated troponin.  She is not on anticoagulation PTA, CBC wnl  Goal of Therapy:  Heparin level 0.3-0.7 units/ml Monitor platelets by anticoagulation protocol: Yes   Plan:  Heparin 3800 units IV x 1, and gtt at 800 units/hr F/u 6 hour heparin level  Bertis Ruddy, PharmD Clinical Pharmacist ED Pharmacist Phone # [phone] 12/02/2021 9:57 PM

## 2021-12-02 NOTE — ED Provider Notes (Signed)
[facility] EMERGENCY DEPT Provider Note   CSN: [id number] Arrival date & time: 12/02/21  1813     History  Chief Complaint  Patient presents with   Nausea   Hypertension   Emesis    Kayla Deleon is a 71 y.o. female.  Patient is a 71 year old female who presents with headache associated with nausea and vomiting.  She said that she has had a headache since this morning.  It is bifrontal.  It was fairly intense this morning but is eased off.  She took some ibuprofen around noon.  She still has a bit of a headache.  There is no associated neck pain.  No vision changes.  She has had some nausea throughout the day.  She had a cardiac CT scan today given that she has had some ongoing chest tightness related to her recent COVID infection.  She has had an elevated cardiac score in the past and given the chest tightness in association with elevated cardiac score, she saw her cardiologist who elected to get a cardiac CT today.  She said after the cardiac CT, she continued to be nauseated and had a headache.  She went to cookout and ate some chicken.  When she got to the emergency department she had some vomiting.  She denies any abdominal pain.  She did note that her blood pressure has been elevated in the 382 systolic range.  She has no associated chest pain other than in the last 5 to 7 minutes she has maybe a little bit of pressure on the left side of her chest.  No other chest pain today.  No shortness of breath.  She has ongoing cough from her COVID infection but says it is much better.  No fevers.  No urinary symptoms.  No change in her bowels.  No diarrhea.       Home Medications Prior to Admission medications   Medication Sig Start Date End Date Taking? Authorizing Provider  ALBUTEROL IN Inhale into the lungs as needed.    [provider]  buPROPion (WELLBUTRIN SR) 200 MG 12 hr tablet Take 200 mg by mouth daily. 10/19/15   [provider]  CLODERM 0.1 %  cream Apply 1 application topically 2 (two) times daily as needed. Reported on 10/26/2015 10/25/15   [provider]  Dextromethorphan-guaiFENesin [facility] DM PO) Take by mouth daily.    [provider]  EPINEPHrine 0.3 mg/0.3 mL IJ SOAJ injection USE AS DIRECTED FOR SHRIMP ALLERGY    [provider]  escitalopram (LEXAPRO) 20 MG tablet Take 1 tablet (20 mg total) by mouth daily. For depression and anxiety. 08/28/11   Greig Castilla, FNP  Evolocumab (REPATHA SURECLICK) 505 MG/ML SOAJ Inject 1 Dose into the skin every 14 (fourteen) days. 04/04/21   Hilty, Nadean Corwin, MD  ezetimibe (ZETIA) 10 MG tablet Take 10 mg by mouth daily. 03/18/21   [provider]  ibuprofen (ADVIL,MOTRIN) 200 MG tablet Take 400 mg by mouth every 6 (six) hours as needed. For headache    [provider]  lamoTRIgine (LAMICTAL) 25 MG tablet Take 50 mg by mouth daily.    [provider]  losartan (COZAAR) 25 MG tablet Take 25 mg by mouth every morning. 02/13/21   [provider]  metoprolol tartrate (LOPRESSOR) 100 MG tablet Take 1 pill 2 hours prior to procedure 12/02/21   Lenna Sciara, NP  Omega-3 Fatty Acids (FISH OIL PO) Take 2 capsules by mouth daily.  [provider]  pantoprazole (PROTONIX) 40 MG tablet Take 40 mg by mouth daily.    [provider]  Probiotic Product (PROBIOTIC PO) Take by mouth daily.    [provider]      Allergies    Shellfish allergy, Statins, and Flagyl [metronidazole hcl]    Review of Systems   Review of Systems  Constitutional:  Negative for chills, diaphoresis, fatigue and fever.  HENT:  Negative for congestion, rhinorrhea and sneezing.   Eyes: Negative.   Respiratory:  Positive for chest tightness. Negative for cough and shortness of breath.   Cardiovascular:  Negative for chest pain and leg swelling.  Gastrointestinal:  Positive for nausea. Negative for abdominal pain, blood in stool, diarrhea and  vomiting.  Genitourinary:  Negative for difficulty urinating, flank pain, frequency and hematuria.  Musculoskeletal:  Negative for arthralgias and back pain.  Skin:  Negative for rash.  Neurological:  Positive for headaches. Negative for dizziness, speech difficulty, weakness and numbness.    Physical Exam Updated Vital Signs BP (!) 127/115   Pulse 61   Temp 98.2 F (36.8 C) (Oral)   Resp 16   Ht '5\' 4"'$  (1.626 m)   Wt 63.5 kg   SpO2 94%   BMI 24.03 kg/m  Physical Exam Constitutional:      Appearance: She is well-developed.  HENT:     Head: Normocephalic and atraumatic.  Eyes:     Pupils: Pupils are equal, round, and reactive to light.  Cardiovascular:     Rate and Rhythm: Normal rate and regular rhythm.     Heart sounds: Normal heart sounds.  Pulmonary:     Effort: Pulmonary effort is normal. No respiratory distress.     Breath sounds: Normal breath sounds. No wheezing or rales.  Chest:     Chest wall: No tenderness.  Abdominal:     General: Bowel sounds are normal.     Palpations: Abdomen is soft.     Tenderness: There is no abdominal tenderness. There is no guarding or rebound.  Musculoskeletal:        General: Normal range of motion.     Cervical back: Normal range of motion and neck supple.  Lymphadenopathy:     Cervical: No cervical adenopathy.  Skin:    General: Skin is warm and dry.     Findings: No rash.  Neurological:     Mental Status: She is alert and oriented to person, place, and time.     Comments: Motor 5/5 all extremities Sensation grossly intact to LT all extremities CN II-XII grossly intact       ED Results / Procedures / Treatments   Labs (all labs ordered are listed, but only abnormal results are displayed) Labs Reviewed  COMPREHENSIVE METABOLIC PANEL - Abnormal; Notable for the following components:      Result Value   Sodium 122 (*)    Chloride 90 (*)    CO2 20 (*)    Glucose, Bld 142 (*)    All other components within normal  limits  CBC - Abnormal; Notable for the following components:   WBC 16.1 (*)    All other components within normal limits  URINALYSIS, ROUTINE W REFLEX MICROSCOPIC - Abnormal; Notable for the following components:   Specific Gravity, Urine >1.046 (*)    Hgb urine dipstick SMALL (*)    Protein, ur TRACE (*)    All other components within normal limits  TROPONIN I (HIGH SENSITIVITY) - Abnormal; Notable for  the following components:   Troponin I (High Sensitivity) 54 (*)    All other components within normal limits  TROPONIN I (HIGH SENSITIVITY) - Abnormal; Notable for the following components:   Troponin I (High Sensitivity) 157 (*)    All other components within normal limits  LIPASE, BLOOD  HEPARIN LEVEL (UNFRACTIONATED)    EKG None  Radiology DG Chest 2 View  Result Date: 12/02/2021 CLINICAL DATA:  Chest pain. EXAM: CHEST - 2 VIEW COMPARISON:  Chest x-ray dated July 26, 2020. FINDINGS: The heart size and mediastinal contours are within normal limits. Both lungs are clear. The visualized skeletal structures are unremarkable. IMPRESSION: No active cardiopulmonary disease. Electronically Signed   By: Titus Dubin M.D.   On: 12/02/2021 19:56   CT Head Wo Contrast  Result Date: 12/02/2021 CLINICAL DATA:  Headache, new or worsening (Age >= 50y) EXAM: CT HEAD WITHOUT CONTRAST TECHNIQUE: Contiguous axial images were obtained from the base of the skull through the vertex without intravenous contrast. RADIATION DOSE REDUCTION: This exam was performed according to the departmental dose-optimization program which includes automated exposure control, adjustment of the mA and/or kV according to patient size and/or use of iterative reconstruction technique. COMPARISON:  None Available. FINDINGS: Brain: There is no acute intracranial hemorrhage, mass effect, or edema. Gray-white differentiation is preserved. There is no extra-axial fluid collection. Ventricles and sulci are within normal limits in  size and configuration. Patchy hypoattenuation in the supratentorial white matter is nonspecific but may reflect mild chronic microvascular ischemic changes. Vascular: There is atherosclerotic calcification at the skull base. Skull: Calvarium is unremarkable. 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 Sinuses/Orbits: No acute finding. Other: None. IMPRESSION: No acute intracranial abnormality. Mild chronic microvascular ischemic changes. 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 Electronically Signed   By: Macy Mis M.D.   On: 12/02/2021 19:56   CT CORONARY MORPH W/CTA COR W/SCORE W/CA W/CM &/OR WO/CM  Addendum Date: 12/02/2021   ADDENDUM REPORT: 12/02/2021 16:47 CLINICAL DATA:  Chest pain EXAM: Cardiac CTA MEDICATIONS: Sub lingual nitro. 4 mg and lopressor '100mg'$  TECHNIQUE: The patient was scanned on a [employer] 192 scanner. Gantry rotation speed was 250 msecs. Collimation was. 6 mm . A 120 kV prospective scan was triggered in the ascending thoracic aorta at 140 HU's with full mA between 30-70% of the R-R interval . Average HR during the scan was 66 bpm. The 3D data set was interpreted on a dedicated work station using MPR, MIP and VRT modes. A total of 80 cc of contrast was used. FINDINGS: Non-cardiac: See separate report from [facility]. No significant findings on limited lung and soft tissue windows. Calcium score: Calcium noted in RCA and LAD LM 0 LAD 47.9 RCA 81 LCX 0 Total 129 which is 79 th percentile for age/sex Coronary Arteries: Right dominant with no anomalies LM: Normal LAD: 1-24% calcified plaque proximally, 25-49% mixed plaque in mid vessel 25-49% soft plaque distally with mis registration artifact D1: Small vessel normal D2: Small vessel normal Circumflex: Normal OM1: Normal OM2: Normal RCA: 1-24% calcified plaque in mid vessel. 1-24% mixed plaque in distal vessel  Significant motion artifact present PDA: Normal PLA: Normal IMPRESSION: 1. Calcium score 129 which is 79 th percentile for age/sex 2.  Normal ascending thoracic aorta 3.2 cm 3.  CAD RADS 2 non obstructive CAD see description above [place] Electronically Signed   By: [place] Rouge M.D.   On: 12/02/2021  16:47   Result Date: 12/02/2021 EXAM: OVER-READ INTERPRETATION  CT CHEST The following report is an over-read performed by radiologist Dr. Aletta Edouard of [facility], [place] on 12/02/2021. This over-read does not include interpretation of cardiac or coronary anatomy or pathology. The coronary CTA interpretation by the cardiologist is attached. COMPARISON:  Prior calcium score study on 10/12/2019 FINDINGS: Vascular: No significant noncardiac vascular findings. Mediastinum/Nodes: Visualized mediastinum and hilar regions demonstrate no lymphadenopathy or masses. Small hiatal hernia present. Lungs/Pleura: Visualized lungs show no evidence of pulmonary edema, consolidation, pneumothorax, nodule or pleural fluid. Upper Abdomen: No acute abnormality. Musculoskeletal: No chest wall mass or suspicious bone lesions identified. IMPRESSION: Small hiatal hernia. Electronically Signed: By: Aletta Edouard M.D. On: 12/02/2021 16:20    Procedures Procedures    Medications Ordered in ED Medications  heparin bolus via infusion 3,800 Units (has no administration in time range)  heparin ADULT infusion 100 units/mL (25000 units/237m) (has no administration in time range)  sodium chloride 0.9 % bolus 500 mL (0 mLs Intravenous Stopped 12/02/21 2036)    ED Course/ Medical Decision Making/ A&P                           Medical Decision Making Amount and/or Complexity of Data Reviewed Labs: ordered. Radiology: ordered.  Risk Prescription drug management. Decision regarding hospitalization.   Patient is a 71year old female who presents with nausea since this morning.  She also has a bifrontal headache and  her blood pressures been elevated.  She does not have any focal neurologic deficits.  She had 2 episodes of vomiting but none since then.  She is feeling better since she threw up.  Her labs are remarkable for a marked hyponatremia with a sodium of 122.  She does report that she drinks 96 ounces of water in preparation for her CT scan this morning and the nausea started after that.  She was gently given normal saline 500 cc.  We will restrict her water intake here.  She had a head CT which showed no acute abnormality.   Chest x-ray showed no acute abnormality.  This was interpreted by me and confirmed by the radiologist.  She also has had a couple episodes of chest tightness in the ED.  She has a new left bundle branch block as compared to her EKG from June 23 of this year.  She does not have any ongoing chest pain but has had a couple episodes.  Her troponin is mildly elevated.  Second troponin is increased from the first.  I discussed with the cardiologist on-call, Dr. NMarcelle Smiling  He reviewed the cardiac CT.  Initially felt that it is pretty unlikely to be ACS given that the cardiac CT was reassuring.  However with the upward twinging troponins, he recommends going ahead and starting the patient on heparin and they will consult on the patient.  Recommends hospitalist admission.  I spoke with Dr. HNevada Cranewho will admit the patient for further treatment.  CRITICAL CARE Performed by: MMalvin JohnsTotal critical care time: 90 minutes Critical care time was exclusive of separately billable procedures and treating other patients. Critical care was necessary to treat or prevent imminent or life-threatening deterioration. Critical care was time spent personally by me on the following activities: development of treatment plan with patient and/or surrogate as well as nursing, discussions with consultants, evaluation of patient's response to treatment, examination of patient, obtaining history from patient or surrogate,  ordering and  performing treatments and interventions, ordering and review of laboratory studies, ordering and review of radiographic studies, pulse oximetry and re-evaluation of patient's condition.   Final Clinical Impression(s) / ED Diagnoses Final diagnoses:  NSTEMI (non-ST elevated myocardial infarction) ([place])  Hyponatremia    Rx / DC Orders ED Discharge Orders     None         Malvin Johns, MD 12/02/21 2229

## 2021-12-02 NOTE — Telephone Encounter (Signed)
 *  STAT* If patient is at the pharmacy, call can be transferred to refill team.   1. Which medications need to be refilled? (please list name of each medication and dose if known) metoprolol tartrate (LOPRESSOR) 100 MG tablet  2. Which pharmacy/location (including street and city if local pharmacy) is medication to be sent to? [id number] [place] - [address]  3. Do they need a 30 day or 90 day supply? 1 pill  Pt said, she lost this medication and her CT is today at 3:30 pm. She needs this as soon as possible today

## 2021-12-02 NOTE — ED Triage Notes (Signed)
Pt arrives to ED with c/o nausea, vomiting, and HTN. Pt reports this started this afternoon. Pt does reports having a cardiac CT scan today with contrast. Pt reports she was nauseous before the Cardiac CT scan today but the nausea has worsened throughout the afternoon.

## 2021-12-02 NOTE — ED Notes (Signed)
Date and time results received: 12/02/21 2117 (use smartphrase ".now" to insert current time)  Test: troponin Critical Value: 157  Name of Provider Notified: M. Tamera Punt, MD  Orders Received? Or Actions Taken?:  n/a

## 2021-12-03 ENCOUNTER — Telehealth: Payer: Self-pay

## 2021-12-03 ENCOUNTER — Encounter (HOSPITAL_COMMUNITY)
Admission: EM | Disposition: A | Discharge status: Home / Self Care | Payer: Self-pay | Source: Home / Self Care | Attending: Cardiovascular Disease

## 2021-12-03 ENCOUNTER — Encounter (HOSPITAL_COMMUNITY): Payer: Self-pay | Admitting: Cardiovascular Disease

## 2021-12-03 ENCOUNTER — Inpatient Hospital Stay (HOSPITAL_COMMUNITY): Payer: PPO

## 2021-12-03 DIAGNOSIS — I2510 Atherosclerotic heart disease of native coronary artery without angina pectoris: Secondary | ICD-10-CM | POA: Diagnosis present

## 2021-12-03 DIAGNOSIS — E782 Mixed hyperlipidemia: Secondary | ICD-10-CM | POA: Diagnosis not present

## 2021-12-03 DIAGNOSIS — E871 Hypo-osmolality and hyponatremia: Secondary | ICD-10-CM | POA: Diagnosis not present

## 2021-12-03 DIAGNOSIS — I5181 Takotsubo syndrome: Secondary | ICD-10-CM | POA: Diagnosis not present

## 2021-12-03 DIAGNOSIS — Z8616 Personal history of COVID-19: Secondary | ICD-10-CM | POA: Diagnosis not present

## 2021-12-03 DIAGNOSIS — I447 Left bundle-branch block, unspecified: Secondary | ICD-10-CM | POA: Diagnosis present

## 2021-12-03 DIAGNOSIS — I200 Unstable angina: Secondary | ICD-10-CM | POA: Diagnosis not present

## 2021-12-03 DIAGNOSIS — I428 Other cardiomyopathies: Secondary | ICD-10-CM

## 2021-12-03 DIAGNOSIS — I214 Non-ST elevation (NSTEMI) myocardial infarction: Secondary | ICD-10-CM

## 2021-12-03 DIAGNOSIS — Z888 Allergy status to other drugs, medicaments and biological substances status: Secondary | ICD-10-CM | POA: Diagnosis not present

## 2021-12-03 DIAGNOSIS — E7849 Other hyperlipidemia: Secondary | ICD-10-CM | POA: Diagnosis present

## 2021-12-03 DIAGNOSIS — F05 Delirium due to known physiological condition: Secondary | ICD-10-CM

## 2021-12-03 DIAGNOSIS — F339 Major depressive disorder, recurrent, unspecified: Secondary | ICD-10-CM | POA: Diagnosis not present

## 2021-12-03 DIAGNOSIS — I10 Essential (primary) hypertension: Secondary | ICD-10-CM | POA: Diagnosis not present

## 2021-12-03 DIAGNOSIS — Z87891 Personal history of nicotine dependence: Secondary | ICD-10-CM | POA: Diagnosis not present

## 2021-12-03 DIAGNOSIS — F411 Generalized anxiety disorder: Secondary | ICD-10-CM | POA: Diagnosis present

## 2021-12-03 DIAGNOSIS — G4733 Obstructive sleep apnea (adult) (pediatric): Secondary | ICD-10-CM | POA: Diagnosis present

## 2021-12-03 DIAGNOSIS — Z20822 Contact with and (suspected) exposure to covid-19: Secondary | ICD-10-CM | POA: Diagnosis not present

## 2021-12-03 DIAGNOSIS — Z91013 Allergy to seafood: Secondary | ICD-10-CM | POA: Diagnosis not present

## 2021-12-03 DIAGNOSIS — R931 Abnormal findings on diagnostic imaging of heart and coronary circulation: Secondary | ICD-10-CM | POA: Diagnosis not present

## 2021-12-03 DIAGNOSIS — Z881 Allergy status to other antibiotic agents status: Secondary | ICD-10-CM | POA: Diagnosis not present

## 2021-12-03 DIAGNOSIS — J9601 Acute respiratory failure with hypoxia: Secondary | ICD-10-CM | POA: Diagnosis not present

## 2021-12-03 DIAGNOSIS — Z8249 Family history of ischemic heart disease and other diseases of the circulatory system: Secondary | ICD-10-CM | POA: Diagnosis not present

## 2021-12-03 DIAGNOSIS — E7800 Pure hypercholesterolemia, unspecified: Secondary | ICD-10-CM | POA: Diagnosis present

## 2021-12-03 DIAGNOSIS — Z8349 Family history of other endocrine, nutritional and metabolic diseases: Secondary | ICD-10-CM | POA: Diagnosis not present

## 2021-12-03 DIAGNOSIS — Z79899 Other long term (current) drug therapy: Secondary | ICD-10-CM | POA: Diagnosis not present

## 2021-12-03 DIAGNOSIS — K219 Gastro-esophageal reflux disease without esophagitis: Secondary | ICD-10-CM | POA: Diagnosis present

## 2021-12-03 DIAGNOSIS — G929 Unspecified toxic encephalopathy: Secondary | ICD-10-CM | POA: Diagnosis not present

## 2021-12-03 HISTORY — PX: LEFT HEART CATH AND CORONARY ANGIOGRAPHY: CATH118249

## 2021-12-03 LAB — LIPID PANEL
Cholesterol: 142 mg/dL (ref 0–200)
HDL: 63 mg/dL (ref >40)
LDL Cholesterol: 47 mg/dL (ref 0–99)
Total CHOL/HDL Ratio: 2.3 RATIO
Triglycerides: 160 mg/dL — ABNORMAL HIGH (ref <150)
VLDL: 32 mg/dL (ref 0–40)

## 2021-12-03 LAB — HEMOGLOBIN A1C
Hgb A1c MFr Bld: 5.8 % — ABNORMAL HIGH (ref 4.8–5.6)
Mean Plasma Glucose: 119.76 mg/dL

## 2021-12-03 LAB — CBC
HCT: 34.4 % — ABNORMAL LOW (ref 36.0–46.0)
Hemoglobin: 12.3 g/dL (ref 12.0–15.0)
MCH: 30.4 pg (ref 26.0–34.0)
MCHC: 35.8 g/dL (ref 30.0–36.0)
MCV: 84.9 fL (ref 80.0–100.0)
Platelets: 289 10*3/uL (ref 150–400)
RBC: 4.05 MIL/uL (ref 3.87–5.11)
RDW: 12 % (ref 11.5–15.5)
WBC: 14.3 10*3/uL — ABNORMAL HIGH (ref 4.0–10.5)
nRBC: 0 % (ref 0.0–0.2)

## 2021-12-03 LAB — COMPREHENSIVE METABOLIC PANEL
ALT: 22 U/L (ref 0–44)
AST: 26 U/L (ref 15–41)
Albumin: 3.8 g/dL (ref 3.5–5.0)
Alkaline Phosphatase: 51 U/L (ref 38–126)
Anion gap: 18 — ABNORMAL HIGH (ref 5–15)
BUN: 14 mg/dL (ref 8–23)
CO2: 22 mmol/L (ref 22–32)
Calcium: 8.8 mg/dL — ABNORMAL LOW (ref 8.9–10.3)
Chloride: 83 mmol/L — ABNORMAL LOW (ref 98–111)
Creatinine, Ser: 0.74 mg/dL (ref 0.44–1.00)
GFR, Estimated: >60 mL/min (ref >60)
Glucose, Bld: 185 mg/dL — ABNORMAL HIGH (ref 70–99)
Potassium: 4 mmol/L (ref 3.5–5.1)
Sodium: 123 mmol/L — ABNORMAL LOW (ref 135–145)
Total Bilirubin: 0.9 mg/dL (ref 0.3–1.2)
Total Protein: 7.2 g/dL (ref 6.5–8.1)

## 2021-12-03 LAB — APTT: aPTT: 39 seconds — ABNORMAL HIGH (ref 24–36)

## 2021-12-03 LAB — POCT I-STAT, CHEM 8
BUN: 16 mg/dL (ref 8–23)
Calcium, Ion: 1.12 mmol/L — ABNORMAL LOW (ref 1.15–1.40)
Chloride: 84 mmol/L — ABNORMAL LOW (ref 98–111)
Creatinine, Ser: 0.6 mg/dL (ref 0.44–1.00)
Glucose, Bld: 187 mg/dL — ABNORMAL HIGH (ref 70–99)
HCT: 40 % (ref 36.0–46.0)
Hemoglobin: 13.6 g/dL (ref 12.0–15.0)
Potassium: 3.9 mmol/L (ref 3.5–5.1)
Sodium: 121 mmol/L — ABNORMAL LOW (ref 135–145)
TCO2: 24 mmol/L (ref 22–32)

## 2021-12-03 LAB — HIV ANTIBODY (ROUTINE TESTING W REFLEX): HIV Screen 4th Generation wRfx: NONREACTIVE

## 2021-12-03 LAB — BASIC METABOLIC PANEL
Anion gap: 14 (ref 5–15)
Anion gap: 13 (ref 5–15)
BUN: 17 mg/dL (ref 8–23)
BUN: 19 mg/dL (ref 8–23)
CO2: 24 mmol/L (ref 22–32)
CO2: 21 mmol/L — ABNORMAL LOW (ref 22–32)
Calcium: 8.5 mg/dL — ABNORMAL LOW (ref 8.9–10.3)
Calcium: 8.3 mg/dL — ABNORMAL LOW (ref 8.9–10.3)
Chloride: 84 mmol/L — ABNORMAL LOW (ref 98–111)
Chloride: 89 mmol/L — ABNORMAL LOW (ref 98–111)
Creatinine, Ser: 0.87 mg/dL (ref 0.44–1.00)
Creatinine, Ser: 1 mg/dL (ref 0.44–1.00)
GFR, Estimated: >60 mL/min (ref >60)
GFR, Estimated: >60 mL/min (ref >60)
Glucose, Bld: 133 mg/dL — ABNORMAL HIGH (ref 70–99)
Glucose, Bld: 115 mg/dL — ABNORMAL HIGH (ref 70–99)
Potassium: 4 mmol/L (ref 3.5–5.1)
Potassium: 3.7 mmol/L (ref 3.5–5.1)
Sodium: 122 mmol/L — ABNORMAL LOW (ref 135–145)
Sodium: 123 mmol/L — ABNORMAL LOW (ref 135–145)

## 2021-12-03 LAB — RESP PANEL BY RT-PCR (FLU A&B, COVID) ARPGX2
Influenza A by PCR: NEGATIVE
Influenza B by PCR: NEGATIVE
SARS Coronavirus 2 by RT PCR: NEGATIVE

## 2021-12-03 LAB — ECHOCARDIOGRAM COMPLETE
Area-P 1/2: 2.61 cm2
Calc EF: 38 %
Height: 64 in
S' Lateral: 4.2 cm
Single Plane A2C EF: 35.4 %
Single Plane A4C EF: 38.6 %
Weight: 2240 oz

## 2021-12-03 LAB — TROPONIN I (HIGH SENSITIVITY)
Troponin I (High Sensitivity): 176 ng/L (ref <18)
Troponin I (High Sensitivity): 196 ng/L (ref <18)

## 2021-12-03 LAB — PROTIME-INR
INR: 1 (ref 0.8–1.2)
Prothrombin Time: 13.4 seconds (ref 11.4–15.2)

## 2021-12-03 LAB — HEPARIN LEVEL (UNFRACTIONATED): Heparin Unfractionated: 0.35 IU/mL (ref 0.30–0.70)

## 2021-12-03 SURGERY — LEFT HEART CATH AND CORONARY ANGIOGRAPHY
Anesthesia: LOCAL

## 2021-12-03 MED ORDER — HEPARIN (PORCINE) IN NACL 1000-0.9 UT/500ML-% IV SOLN
INTRAVENOUS | Status: AC
  Filled 2021-12-03: qty 1000

## 2021-12-03 MED ORDER — ESCITALOPRAM OXALATE 10 MG PO TABS
20.0000 mg | ORAL_TABLET | Freq: Every day | ORAL | Status: DC
  Filled 2021-12-03: qty 2

## 2021-12-03 MED ORDER — HALOPERIDOL LACTATE 5 MG/ML IJ SOLN
INTRAMUSCULAR | Status: AC
  Filled 2021-12-03: qty 1

## 2021-12-03 MED ORDER — FUROSEMIDE 10 MG/ML IJ SOLN
20.0000 mg | Freq: Once | INTRAMUSCULAR | Status: AC
  Administered 2021-12-03: 20 mg via INTRAVENOUS
  Filled 2021-12-03: qty 2

## 2021-12-03 MED ORDER — HALOPERIDOL LACTATE 5 MG/ML IJ SOLN
2.0000 mg | Freq: Once | INTRAMUSCULAR | Status: AC
  Administered 2021-12-03: 2 mg via INTRAVENOUS

## 2021-12-03 MED ORDER — ENOXAPARIN SODIUM 40 MG/0.4ML IJ SOSY
40.0000 mg | PREFILLED_SYRINGE | INTRAMUSCULAR | Status: DC
  Administered 2021-12-04 – 2021-12-06 (×3): 40 mg via SUBCUTANEOUS
  Filled 2021-12-03 (×3): qty 0.4

## 2021-12-03 MED ORDER — DEXMEDETOMIDINE HCL IN NACL 400 MCG/100ML IV SOLN
0.2000 ug/kg/h | INTRAVENOUS | Status: DC
  Administered 2021-12-03: 0.4 ug/kg/h via INTRAVENOUS

## 2021-12-03 MED ORDER — ASPIRIN 325 MG PO TABS
325.0000 mg | ORAL_TABLET | Freq: Every day | ORAL | Status: DC
  Administered 2021-12-03: 325 mg via ORAL
  Filled 2021-12-03: qty 1

## 2021-12-03 MED ORDER — PANTOPRAZOLE SODIUM 40 MG PO TBEC
40.0000 mg | DELAYED_RELEASE_TABLET | Freq: Every day | ORAL | Status: DC
  Filled 2021-12-03: qty 1

## 2021-12-03 MED ORDER — NITROGLYCERIN IN D5W 200-5 MCG/ML-% IV SOLN
0.0000 ug/min | INTRAVENOUS | Status: DC
  Administered 2021-12-03: 5 ug/min via INTRAVENOUS
  Filled 2021-12-03: qty 250

## 2021-12-03 MED ORDER — LIDOCAINE HCL (PF) 1 % IJ SOLN
INTRAMUSCULAR | Status: DC | PRN
  Administered 2021-12-03: 2 mL

## 2021-12-03 MED ORDER — NITROGLYCERIN 0.4 MG SL SUBL
0.4000 mg | SUBLINGUAL_TABLET | SUBLINGUAL | Status: DC | PRN
  Administered 2021-12-03 – 2021-12-04 (×6): 0.4 mg via SUBLINGUAL
  Filled 2021-12-03 (×4): qty 1

## 2021-12-03 MED ORDER — EZETIMIBE 10 MG PO TABS
10.0000 mg | ORAL_TABLET | Freq: Every day | ORAL | Status: DC
  Filled 2021-12-03: qty 1

## 2021-12-03 MED ORDER — SODIUM CHLORIDE 0.9 % WEIGHT BASED INFUSION: 1.0000 mL/kg/h | INTRAVENOUS | Status: AC

## 2021-12-03 MED ORDER — IOHEXOL 350 MG/ML SOLN
INTRAVENOUS | Status: DC | PRN
  Administered 2021-12-03: 65 mL

## 2021-12-03 MED ORDER — FENTANYL CITRATE (PF) 100 MCG/2ML IJ SOLN
INTRAMUSCULAR | Status: AC
  Filled 2021-12-03: qty 2

## 2021-12-03 MED ORDER — ASPIRIN 300 MG RE SUPP: 300.0000 mg | RECTAL | Status: DC

## 2021-12-03 MED ORDER — ORAL CARE MOUTH RINSE: 15.0000 mL | OROMUCOSAL | Status: DC | PRN

## 2021-12-03 MED ORDER — MIDAZOLAM HCL 2 MG/2ML IJ SOLN
INTRAMUSCULAR | Status: AC
  Filled 2021-12-03: qty 2

## 2021-12-03 MED ORDER — ONDANSETRON HCL 4 MG/2ML IJ SOLN
4.0000 mg | Freq: Once | INTRAMUSCULAR | Status: AC
Start: 2021-12-03 — End: 2021-12-03
  Administered 2021-12-03: 4 mg via INTRAVENOUS
  Filled 2021-12-03: qty 2

## 2021-12-03 MED ORDER — PANTOPRAZOLE SODIUM 40 MG PO TBEC
40.0000 mg | DELAYED_RELEASE_TABLET | Freq: Every day | ORAL | Status: DC
Start: 2021-12-03 — End: 2021-12-06
  Administered 2021-12-04 – 2021-12-06 (×3): 40 mg via ORAL
  Filled 2021-12-03 (×3): qty 1

## 2021-12-03 MED ORDER — SODIUM CHLORIDE 0.9% FLUSH
3.0000 mL | Freq: Two times a day (BID) | INTRAVENOUS | Status: DC
  Administered 2021-12-04 – 2021-12-05 (×4): 3 mL via INTRAVENOUS

## 2021-12-03 MED ORDER — ONDANSETRON HCL 4 MG/2ML IJ SOLN
4.0000 mg | Freq: Four times a day (QID) | INTRAMUSCULAR | Status: DC | PRN
  Administered 2021-12-04: 4 mg via INTRAVENOUS
  Filled 2021-12-03: qty 2

## 2021-12-03 MED ORDER — LOSARTAN POTASSIUM 25 MG PO TABS
25.0000 mg | ORAL_TABLET | Freq: Every morning | ORAL | Status: DC
  Filled 2021-12-03: qty 1

## 2021-12-03 MED ORDER — ESCITALOPRAM OXALATE 10 MG PO TABS
20.0000 mg | ORAL_TABLET | Freq: Every day | ORAL | Status: DC
  Administered 2021-12-04 – 2021-12-06 (×3): 20 mg via ORAL
  Filled 2021-12-03 (×3): qty 2

## 2021-12-03 MED ORDER — FENTANYL CITRATE (PF) 100 MCG/2ML IJ SOLN
INTRAMUSCULAR | Status: DC | PRN
  Administered 2021-12-03: 25 ug via INTRAVENOUS

## 2021-12-03 MED ORDER — CHLORHEXIDINE GLUCONATE CLOTH 2 % EX PADS
6.0000 | MEDICATED_PAD | Freq: Every day | CUTANEOUS | Status: DC
  Administered 2021-12-03 – 2021-12-05 (×3): 6 via TOPICAL

## 2021-12-03 MED ORDER — VERAPAMIL HCL 2.5 MG/ML IV SOLN
INTRAVENOUS | Status: AC
  Filled 2021-12-03: qty 2

## 2021-12-03 MED ORDER — HEPARIN (PORCINE) IN NACL 1000-0.9 UT/500ML-% IV SOLN
INTRAVENOUS | Status: DC | PRN
  Administered 2021-12-03 (×2): 500 mL

## 2021-12-03 MED ORDER — SODIUM CHLORIDE 0.9 % IV SOLN: 12.5000 mg | Freq: Four times a day (QID) | INTRAVENOUS | Status: DC | PRN

## 2021-12-03 MED ORDER — DEXMEDETOMIDINE HCL IN NACL 400 MCG/100ML IV SOLN
INTRAVENOUS | Status: AC
  Filled 2021-12-03: qty 100

## 2021-12-03 MED ORDER — LIDOCAINE HCL (PF) 1 % IJ SOLN
INTRAMUSCULAR | Status: AC
  Filled 2021-12-03: qty 30

## 2021-12-03 MED ORDER — MIDAZOLAM HCL 2 MG/2ML IJ SOLN
INTRAMUSCULAR | Status: DC | PRN
  Administered 2021-12-03: 1 mg via INTRAVENOUS

## 2021-12-03 MED ORDER — LOSARTAN POTASSIUM 25 MG PO TABS: 25.0000 mg | ORAL_TABLET | Freq: Every morning | ORAL | Status: DC

## 2021-12-03 MED ORDER — PERFLUTREN LIPID MICROSPHERE
1.0000 mL | INTRAVENOUS | Status: AC | PRN
  Administered 2021-12-03: 2 mL via INTRAVENOUS

## 2021-12-03 MED ORDER — ASPIRIN 81 MG PO CHEW: 324.0000 mg | CHEWABLE_TABLET | ORAL | Status: DC

## 2021-12-03 MED ORDER — SODIUM CHLORIDE 0.9 % IV SOLN: 250.0000 mL | INTRAVENOUS | Status: DC | PRN

## 2021-12-03 MED ORDER — SODIUM CHLORIDE 0.9% FLUSH: 3.0000 mL | INTRAVENOUS | Status: DC | PRN

## 2021-12-03 MED ORDER — HYDRALAZINE HCL 20 MG/ML IJ SOLN: 10.0000 mg | INTRAMUSCULAR | Status: AC | PRN

## 2021-12-03 MED ORDER — SODIUM CHLORIDE 0.9 % IV SOLN
6.2500 mg | Freq: Four times a day (QID) | INTRAVENOUS | Status: DC | PRN
  Administered 2021-12-04: 6.25 mg via INTRAVENOUS
  Filled 2021-12-03: qty 0.25

## 2021-12-03 MED ORDER — EZETIMIBE 10 MG PO TABS
10.0000 mg | ORAL_TABLET | Freq: Every day | ORAL | Status: DC
Start: 2021-12-03 — End: 2021-12-06
  Administered 2021-12-04 – 2021-12-06 (×3): 10 mg via ORAL
  Filled 2021-12-03 (×3): qty 1

## 2021-12-03 MED ORDER — BUPROPION HCL ER (SR) 100 MG PO TB12
200.0000 mg | ORAL_TABLET | Freq: Every day | ORAL | Status: DC
  Administered 2021-12-04 – 2021-12-05 (×2): 200 mg via ORAL
  Filled 2021-12-03 (×4): qty 2

## 2021-12-03 MED ORDER — VERAPAMIL HCL 2.5 MG/ML IV SOLN
INTRAVENOUS | Status: DC | PRN
  Administered 2021-12-03: 10 mL via INTRA_ARTERIAL

## 2021-12-03 MED ORDER — DEXMEDETOMIDINE HCL IN NACL 400 MCG/100ML IV SOLN: 0.4000 ug/kg/h | INTRAVENOUS | Status: DC

## 2021-12-03 MED ORDER — BUPROPION HCL ER (SR) 100 MG PO TB12
200.0000 mg | ORAL_TABLET | Freq: Every day | ORAL | Status: DC
  Filled 2021-12-03: qty 2

## 2021-12-03 MED ORDER — LAMOTRIGINE 25 MG PO TABS
50.0000 mg | ORAL_TABLET | Freq: Every day | ORAL | Status: DC
  Administered 2021-12-04 – 2021-12-05 (×2): 50 mg via ORAL
  Filled 2021-12-03 (×4): qty 2

## 2021-12-03 MED ORDER — LAMOTRIGINE 25 MG PO TABS
50.0000 mg | ORAL_TABLET | Freq: Every day | ORAL | Status: DC
  Filled 2021-12-03: qty 2

## 2021-12-03 MED ORDER — LABETALOL HCL 5 MG/ML IV SOLN: 10.0000 mg | INTRAVENOUS | Status: AC | PRN

## 2021-12-03 MED ORDER — ACETAMINOPHEN 325 MG PO TABS
650.0000 mg | ORAL_TABLET | ORAL | Status: DC | PRN
  Administered 2021-12-04 – 2021-12-05 (×3): 650 mg via ORAL
  Filled 2021-12-03 (×3): qty 2

## 2021-12-03 MED ORDER — HEPARIN SODIUM (PORCINE) 1000 UNIT/ML IJ SOLN
INTRAMUSCULAR | Status: DC | PRN
  Administered 2021-12-03: 3000 [IU] via INTRAVENOUS

## 2021-12-03 MED ORDER — METOPROLOL TARTRATE 25 MG PO TABS
25.0000 mg | ORAL_TABLET | Freq: Two times a day (BID) | ORAL | Status: DC
  Filled 2021-12-03 (×2): qty 1

## 2021-12-03 MED ORDER — ASPIRIN 81 MG PO TBEC
81.0000 mg | DELAYED_RELEASE_TABLET | Freq: Every day | ORAL | Status: DC
  Administered 2021-12-04 – 2021-12-06 (×3): 81 mg via ORAL
  Filled 2021-12-03 (×3): qty 1

## 2021-12-03 SURGICAL SUPPLY — 12 items
BAND CMPR LRG ZPHR (HEMOSTASIS) ×1
BAND ZEPHYR COMPRESS 30 LONG (HEMOSTASIS) ×1 IMPLANT
CATH 5FR JL3.5 JR4 ANG PIG MP (CATHETERS) ×1 IMPLANT
GLIDESHEATH SLEND SS 6F .021 (SHEATH) ×1 IMPLANT
GUIDEWIRE INQWIRE 1.5J.035X260 (WIRE) IMPLANT
INQWIRE 1.5J .035X260CM (WIRE) ×2
KIT ENCORE 26 ADVANTAGE (KITS) ×1 IMPLANT
KIT HEART LEFT (KITS) ×3 IMPLANT
PACK CARDIAC CATHETERIZATION (CUSTOM PROCEDURE TRAY) ×3 IMPLANT
TRANSDUCER W/STOPCOCK (MISCELLANEOUS) ×3 IMPLANT
TUBING CIL FLEX 10 FLL-RA (TUBING) ×3 IMPLANT
VALVE GUARDIAN II ~~LOC~~ HEMO (MISCELLANEOUS) ×1 IMPLANT

## 2021-12-03 NOTE — Progress Notes (Signed)
ANTICOAGULATION CONSULT NOTE  Pharmacy Consult for heparin Indication: chest pain/ACS  Allergies  Allergen Reactions   Shellfish Allergy Anaphylaxis and Rash    Pt. Uses Epipen. Pt. Uses Epipen.    Statins Other (See Comments)    Severe muscle pain   Flagyl [Metronidazole Hcl] Rash    Patient Measurements: Height: '5\' 4"'$  (162.6 cm) Weight: 63.5 kg (140 lb) IBW/kg (Calculated) : 54.7 Heparin Dosing Weight: TBW  Vital Signs: Temp: 98.8 F (37.1 C) (07/19 0632) Temp Source: Oral (07/19 4287) BP: 154/97 (07/19 0630) Pulse Rate: 66 (07/19 0630)  Labs: Recent Labs    12/02/21 1825 12/02/21 2035 12/03/21 0231 12/03/21 0503 12/03/21 0532  HGB 12.2  --   --   --   --   HCT 36.1  --   --   --   --   PLT 335  --   --   --   --   HEPARINUNFRC  --   --   --   --  0.35  CREATININE 0.97  --   --   --   --   TROPONINIHS 54* 157* 176* 196*  --      Estimated Creatinine Clearance: 47.9 mL/min (by C-G formula based on SCr of 0.97 mg/dL).   Medical History: Past Medical History:  Diagnosis Date   Anxiety    Cervical spondylosis    evaluated in the past by neurossurgeon   Depression    Fibroids    in the past    Hypercholesteremia    Hypertension    Mental disorder    Sleep apnea    on CPAP    Assessment: 33 YOF presenting with N/V and hypertension, elevated troponin.  She is not on anticoagulation PTA, CBC wnl  7/19 AM update:  Heparin level is therapeutic   Goal of Therapy:  Heparin level 0.3-0.7 units/ml Monitor platelets by anticoagulation protocol: Yes   Plan:  Cont heparin 800 units/hr 1200 heparin level  Narda Bonds, PharmD, BCPS Clinical Pharmacist Phone: [phone]

## 2021-12-03 NOTE — Progress Notes (Signed)
  Transition of Care [facility]) Screening Note   Patient Details  Name: Onetha Taniqua Issa Date of Birth: 09/05/1950   Transition of Care [facility]) CM/SW Contact:    Milas Gain, [place] Phone Number: 12/03/2021, 4:27 PM    Transition of Care Department [facility]) has reviewed patient and no TOC needs have been identified at this time. We will continue to monitor patient advancement through interdisciplinary progression rounds. If new patient transition needs arise, please place a TOC consult.

## 2021-12-03 NOTE — Telephone Encounter (Signed)
Spoke with pts spouse. He was notified of lab results. Pt will follow up as planned.

## 2021-12-03 NOTE — Assessment & Plan Note (Signed)
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

## 2021-12-03 NOTE — ED Provider Notes (Signed)
Patient after signout at [place] began reporting increased chest pain, like a knot in her left breast, and has some "jerking" of her chest.  Repeat ECG concerning for ST elevations anterior leads with new T wave inversions.  Activated now as Code Stemi.  Pt is already on heparin.  Will start on nitroglycerin infusion as nitro did relieve her chest pain yesterday.  [facility] was reportedly en route for Ed to Ed transfer already for NSTEMI admission.  I will speak to cardiology again.  ED team at [facility] notified - Drs Antony Salmon and Deno Etienne  *  I spoke to Dr Burt Knack from cardiology who agrees the repeat ECG is concerning for possible acute infarct, and agrees with Code STEMI.  Pt picked up by [facility] at 0800 - en route to [facility] hospital, heparin infusion running.   Wyvonnia Dusky, MD 12/03/21 [phone]

## 2021-12-03 NOTE — Assessment & Plan Note (Signed)
-  Patient with excessive water intake, followed by n/v, in conjunction with [place] -Likely multifactorial -Will defer to PCCM, as they have been consulted at this time

## 2021-12-03 NOTE — H&P (Signed)
Cardiology Admission History and Physical:   Patient ID: Kayla Deleon MRN: [id number]; DOB: 05-10-1951   Admission date: 12/02/2021  PCP:  Hulan Fess, MD   [facility] Providers Cardiologist:  Pixie Casino, MD        Chief Complaint:  Chest pain  Patient Profile:   Kayla Deleon is a 71 y.o. female with hx of nonobstructive CAD who is being seen 12/03/2021 for the evaluation of chest pain/STEMI.  History of Present Illness:   Kayla Deleon is 71 years old with a history of chest discomfort.  She was seen for outpatient cardiology evaluation November 11, 2021 for follow-up of an abnormal EKG with age-indeterminate inferior infarct and age-indeterminate anterolateral infarct.  She was recovering from COVID-19 infection at that time.  She complained of a heaviness in her chest on an intermittent basis.  A coronary CT angiogram was performed yesterday, demonstrating nonobstructive coronary artery disease with a normal left main, less than 25% proximal LAD stenosis, 25 to 49% mixed plaque in the mid and distal LAD with misregistration artifact, normal diagonal branches, normal circumflex and obtuse marginal branches, and nonobstructive plaque in the RCA less than 25% with significant motion artifact.  There is no pathology of the ascending aorta and no other significant abnormalities documented.  After her CTA study, she developed progressive nausea and headache.  Pertinent findings included a low sodium of 122.  She was found to have a left bundle branch block on her EKG which was new from previous.  She was noted to have an elevated high-sensitivity troponin.  The patient was started on heparin and was planned to be admitted to the hospitalist service at [facility].  Her troponin trend has been pretty flat with the troponins last night of 157 and 176 and then a high-sensitivity troponin this morning of 196.  However, the patient developed recurrent chest discomfort this morning associated with  nausea.  A repeat EKG showed marked change from prior with evolving anterolateral changes of ST elevation, T wave inversion, and prolonged QT suggesting an evolving anterior infarct.  A code STEMI is called and the patient is transferred emergently for cardiac catheterization.  On arrival to the cardiac catheterization lab, the patient has mild chest discomfort and ongoing nausea.  She describes the discomfort in her chest as substernal in location and pressure-like.  The pain is nonradiating.  She also has an involuntary twitching of her chest that looks like a hiccup but she states that it feels very different than that.  This just started yesterday.  She denies any localizing neurologic symptoms like numbness, tingling, or weakness of the extremities.  She denies any clumsiness of her extremities.  She has had a headache and had a CT scan of the brain yesterday that was negative.  She has no other complaints at present.   Past Medical History:  Diagnosis Date   Anxiety    Cervical spondylosis    evaluated in the past by neurossurgeon   Depression    Fibroids    in the past    Hypercholesteremia    Hypertension    Mental disorder    Sleep apnea    on CPAP    Past Surgical History:  Procedure Laterality Date   CESAREAN SECTION     CHOLECYSTECTOMY       Medications Prior to Admission: Prior to Admission medications   Medication Sig Start Date End Date Taking? Authorizing Provider  ALBUTEROL IN Inhale into the lungs as  needed.    [provider]  buPROPion (WELLBUTRIN SR) 200 MG 12 hr tablet Take 200 mg by mouth daily. 10/19/15   [provider]  CLODERM 0.1 % cream Apply 1 application topically 2 (two) times daily as needed. Reported on 10/26/2015 10/25/15   [provider]  Dextromethorphan-guaiFENesin [facility] DM PO) Take by mouth daily.    [provider]  EPINEPHrine 0.3 mg/0.3 mL IJ SOAJ injection USE AS DIRECTED FOR SHRIMP ALLERGY    [provider]  escitalopram (LEXAPRO) 20 MG tablet Take 1 tablet (20 mg total) by mouth daily. For depression and anxiety. 08/28/11   Greig Castilla, FNP  Evolocumab (REPATHA SURECLICK) 676 MG/ML SOAJ Inject 1 Dose into the skin every 14 (fourteen) days. 04/04/21   Hilty, Nadean Corwin, MD  ezetimibe (ZETIA) 10 MG tablet Take 10 mg by mouth daily. 03/18/21   [provider]  ibuprofen (ADVIL,MOTRIN) 200 MG tablet Take 400 mg by mouth every 6 (six) hours as needed. For headache    [provider]  lamoTRIgine (LAMICTAL) 25 MG tablet Take 50 mg by mouth daily.    [provider]  losartan (COZAAR) 25 MG tablet Take 25 mg by mouth every morning. 02/13/21   [provider]  metoprolol tartrate (LOPRESSOR) 100 MG tablet Take 1 pill 2 hours prior to procedure 12/02/21   Lenna Sciara, NP  Omega-3 Fatty Acids (FISH OIL PO) Take 2 capsules by mouth daily.    [provider]  pantoprazole (PROTONIX) 40 MG tablet Take 40 mg by mouth daily.    [provider]  Probiotic Product (PROBIOTIC PO) Take by mouth daily.    [provider]     Allergies:    Allergies  Allergen Reactions   Shellfish Allergy Anaphylaxis and Rash    Pt. Uses Epipen. Pt. Uses Epipen.    Statins Other (See Comments)    Severe muscle pain   Flagyl [Metronidazole Hcl] Rash    Social History:   Social History   Socioeconomic History   Marital status: Married    Spouse name: Not on file   Number of children: 1   Years of education: Not on file   Highest education level: Bachelor's degree (e.g., BA, AB, BS)  Occupational History   Not on file  Tobacco Use   Smoking status: Former    Packs/day: 0.10    Years: 1.00    Total pack years: 0.10    Types: Cigarettes    Quit date: 05/19/1975    Years since quitting: 46.5   Smokeless tobacco: Never   Tobacco comments:    Smoked in college- occasional/social   Vaping Use   Vaping Use: Never used  Substance and  Sexual Activity   Alcohol use: Yes    Alcohol/week: 0.0 standard drinks of alcohol    Comment: social; 4 beers a month max   Drug use: No   Sexual activity: Not on file  Other Topics Concern   Not on file  Social History Narrative   Lives at home with her husband   Right handed   Drinks 3 diet sodas daily   Social Determinants of Health   Financial Resource Strain: Not on file  Food Insecurity: Not on file  Transportation Needs: Not on file  Physical Activity: Not on file  Stress: Not on file  Social Connections: Not on file  Intimate Partner Violence: Not on file    Family History:   The patient's  family history includes Gout in her brother; Heart attack in her father; Hyperlipidemia in her brother and father; Hypertension in her father; Other in her mother. There is no history of Neuropathy.    ROS:  Please see the history of present illness.  Positive for headache, fatigue, nausea. All other ROS reviewed and negative.     Physical Exam/Data:   Vitals:   12/03/21 0630 12/03/21 0632 12/03/21 0700 12/03/21 0730  BP: (!) 154/97  (!) 152/90 (!) 185/95  Pulse: 66  61 66  Resp: 19  20 (!) 22  Temp:  98.8 F (37.1 C)    TempSrc:  Oral    SpO2: 94%  93% 93%  Weight:      Height:        Intake/Output Summary (Last 24 hours) at 12/03/2021 0809 Last data filed at 12/02/2021 2036 Gross per 24 hour  Intake 500.17 ml  Output --  Net 500.17 ml      12/02/2021    6:23 PM 11/11/2021    8:06 AM 06/26/2021    9:05 AM  Last 3 Weights  Weight (lbs) 140 lb 140 lb 9.6 oz 138 lb 12.8 oz  Weight (kg) 63.504 kg 63.776 kg 62.959 kg     Body mass index is 24.03 kg/m.  General:  Well nourished, well developed, pale-appearing woman in no acute distress HEENT: normal Neck: no JVD Vascular: No carotid bruits; Distal pulses 2+ bilaterally   Cardiac:  normal S1, S2; RRR; no murmur  Lungs:  clear to auscultation bilaterally, no wheezing, rhonchi or rales  Abd: soft, nontender, no  hepatomegaly  Ext: no edema Musculoskeletal:  No deformities, BUE and BLE strength normal and equal Skin: warm and dry  Neuro:  CNs 2-12 intact, no focal abnormalities noted Psych:  Normal affect    EKG:  The ECG that was done this morning was personally reviewed and demonstrates NSR with evolving changes of anterior MI, QT prolongation, new from prior tracing yesterday.  Relevant CV Studies: Coronary CTA 12/02/21: Calcium score: Calcium noted in RCA and LAD   LM 0   LAD 47.9   RCA 81   LCX 0   Total 129 which is 79 th percentile for age/sex   Coronary Arteries: Right dominant with no anomalies   LM: Normal   LAD: 1-24% calcified plaque proximally, 25-49% mixed plaque in mid vessel 25-49% soft plaque distally with mis registration artifact   D1: Small vessel normal   D2: Small vessel normal   Circumflex: Normal   OM1: Normal   OM2: Normal   RCA: 1-24% calcified plaque in mid vessel. 1-24% mixed plaque in distal vessel Significant motion artifact present   PDA: Normal   PLA: Normal   IMPRESSION: 1. Calcium score 129 which is 79 th percentile for age/sex   2.  Normal ascending thoracic aorta 3.2 cm   3.  CAD RADS 2 non obstructive CAD see description above  Laboratory Data:  High Sensitivity Troponin:   Recent Labs  Lab 12/02/21 1825 12/02/21 2035 12/03/21 0231 12/03/21 0503  TROPONINIHS 54* 157* 176* 196*      Chemistry Recent Labs  Lab 12/02/21 1825  NA 122*  K 4.7  CL 90*  CO2 20*  GLUCOSE 142*  BUN 22  CREATININE 0.97  CALCIUM 9.2  GFRNONAA >60  ANIONGAP 12    Recent Labs  Lab 12/02/21 1825  PROT 7.4  ALBUMIN 4.3  AST 21  ALT 13  ALKPHOS 46  BILITOT 0.5  Lipids No results for input(s): "CHOL", "TRIG", "HDL", "LABVLDL", "LDLCALC", "CHOLHDL" in the last 168 hours. Hematology Recent Labs  Lab 12/02/21 1825  WBC 16.1*  RBC 3.98  HGB 12.2  HCT 36.1  MCV 90.7  MCH 30.7  MCHC 33.8  RDW 12.4  PLT 335   Thyroid No  results for input(s): "TSH", "FREET4" in the last 168 hours. BNPNo results for input(s): "BNP", "PROBNP" in the last 168 hours.  DDimer No results for input(s): "DDIMER" in the last 168 hours.   Radiology/Studies:  DG Chest 2 View  Result Date: 12/02/2021 CLINICAL DATA:  Chest pain. EXAM: CHEST - 2 VIEW COMPARISON:  Chest x-ray dated July 26, 2020. FINDINGS: The heart size and mediastinal contours are within normal limits. Both lungs are clear. The visualized skeletal structures are unremarkable. IMPRESSION: No active cardiopulmonary disease. Electronically Signed   By: Titus Dubin M.D.   On: 12/02/2021 19:56   CT Head Wo Contrast  Result Date: 12/02/2021 CLINICAL DATA:  Headache, new or worsening (Age >= 50y) EXAM: CT HEAD WITHOUT CONTRAST TECHNIQUE: Contiguous axial images were obtained from the base of the skull through the vertex without intravenous contrast. RADIATION DOSE REDUCTION: This exam was performed according to the departmental dose-optimization program which includes automated exposure control, adjustment of the mA and/or kV according to patient size and/or use of iterative reconstruction technique. COMPARISON:  None Available. FINDINGS: Brain: There is no acute intracranial hemorrhage, mass effect, or edema. Gray-white differentiation is preserved. There is no extra-axial fluid collection. Ventricles and sulci are within normal limits in size and configuration. Patchy hypoattenuation in the supratentorial white matter is nonspecific but may reflect mild chronic microvascular ischemic changes. Vascular: There is atherosclerotic calcification at the skull base. Skull: Calvarium is unremarkable. 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 Sinuses/Orbits: No acute finding. Other: None. IMPRESSION: No acute intracranial abnormality. Mild chronic microvascular ischemic changes. 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 Electronically Signed   By: Macy Mis M.D.   On: 12/02/2021 19:56   CT CORONARY MORPH W/CTA COR W/SCORE W/CA W/CM &/OR WO/CM  Addendum Date: 12/02/2021   ADDENDUM REPORT: 12/02/2021 16:47 CLINICAL DATA:  Chest pain EXAM: Cardiac CTA MEDICATIONS: Sub lingual nitro. 4 mg and lopressor '100mg'$  TECHNIQUE: The patient was scanned on a [employer] 192 scanner. Gantry rotation speed was 250 msecs. Collimation was. 6 mm . A 120 kV prospective scan was triggered in the ascending thoracic aorta at 140 HU's with full mA between 30-70% of the R-R interval . Average HR during the scan was 66 bpm. The 3D data set was interpreted on a dedicated work station using MPR, MIP and VRT modes. A total of 80 cc of contrast was used. FINDINGS: Non-cardiac: See separate report from [facility]. No significant findings on limited lung and soft tissue windows. Calcium score: Calcium noted in RCA and LAD LM 0 LAD 47.9 RCA 81 LCX 0 Total 129 which is 79 th percentile for age/sex Coronary Arteries: Right dominant with no anomalies LM: Normal LAD: 1-24% calcified plaque proximally, 25-49% mixed plaque in mid vessel 25-49% soft plaque distally with mis registration artifact D1: Small vessel normal D2: Small vessel normal Circumflex: Normal OM1: Normal OM2: Normal RCA: 1-24% calcified plaque in mid vessel. 1-24% mixed plaque in distal vessel Significant motion artifact present PDA: Normal PLA: Normal IMPRESSION: 1. Calcium score 129 which is 79 th percentile for age/sex 2.  Normal ascending thoracic aorta  3.2 cm 3.  CAD RADS 2 non obstructive CAD see description above [place] Electronically Signed   By: [place] Rouge M.D.   On: 12/02/2021 16:47   Result Date: 12/02/2021 EXAM: OVER-READ INTERPRETATION  CT CHEST The following report is an over-read performed by radiologist Dr. Aletta Edouard of [facility], [place] on 12/02/2021. This over-read does not include interpretation of cardiac  or coronary anatomy or pathology. The coronary CTA interpretation by the cardiologist is attached. COMPARISON:  Prior calcium score study on 10/12/2019 FINDINGS: Vascular: No significant noncardiac vascular findings. Mediastinum/Nodes: Visualized mediastinum and hilar regions demonstrate no lymphadenopathy or masses. Small hiatal hernia present. Lungs/Pleura: Visualized lungs show no evidence of pulmonary edema, consolidation, pneumothorax, nodule or pleural fluid. Upper Abdomen: No acute abnormality. Musculoskeletal: No chest wall mass or suspicious bone lesions identified. IMPRESSION: Small hiatal hernia. Electronically Signed: By: Aletta Edouard M.D. On: 12/02/2021 16:20     Assessment and Plan:   Anterior STEMI, evolving EKG changes noted: With recurrent symptoms of chest discomfort and nausea and evolving EKG changes suggesting an anterior infarct, a code STEMI is paged and the patient is brought emergently for cardiac catheterization and possible PCI.  It is obviously unusual that she had a coronary CTA yesterday demonstrating mild nonobstructive plaquing in the LAD territory with patency of the left main, left circumflex, and diagonal branches.  She also had only mild nonobstructive plaquing in the RCA.  Regardless of these findings, catheter angiography is indicated to evaluate acute changes as she may have had an interval plaque rupture.  She has been treated with IV heparin.  Further plans/disposition pending her cardiac catheterization result. Troponin 54-->157-->176-->196. Hyponatremia: unclear etiology. Sodium 136 ----> 122 from 7/11 to 7/18.  The patient is not hypervolemic by examination.  We will follow-up with repeat labs and will place a medicine consult for further evaluation and treatment. Familial hyperlipidemia: The patient is treated with a PCSK9 inhibitor and she has a lipid panel that demonstrates a cholesterol of 137, HDL 53, LDL 63 on treatment.  Her lipoprotein a is elevated at  253.   Risk Assessment/Risk Scores:    TIMI Risk Score for ST  Elevation MI:   The patient's TIMI risk score is 3, which indicates a 4.4% risk of all cause mortality at 30 days.      Severity of Illness: The appropriate patient status for this patient is INPATIENT. Inpatient status is judged to be reasonable and necessary in order to provide the required intensity of service to ensure the patient's safety. The patient's presenting symptoms, physical exam findings, and initial radiographic and laboratory data in the context of their chronic comorbidities is felt to place them at high risk for further clinical deterioration. Furthermore, it is not anticipated that the patient will be medically stable for discharge from the hospital within 2 midnights of admission.   * I certify that at the point of admission it is my clinical judgment that the patient will require inpatient hospital care spanning beyond 2 midnights from the point of admission due to high intensity of service, high risk for further deterioration and high frequency of surveillance required.*   For questions or updates, please contact [place] Please consult [email or web address] for contact info under     Signed, Sherren Mocha, MD  12/03/2021 8:09 AM

## 2021-12-03 NOTE — Consult Note (Signed)
Initial Consultation Note   Patient: Kayla Deleon KWI:[id number] DOB: 12-30-1950 PCP: Hulan Fess, MD DOA: 12/02/2021 DOS: the patient was seen and examined on 12/03/2021 Primary service: Sherren Mocha, MD  Referring physician: Burt Knack Reason for consult: NSTEMI -> STEMI.  Taboksubo.  Requesting consult assistance with hyponatremia.   Assessment and Plan: * Chest pain -Patient presented with chest pain, initial concern for NSTEMI -> STEMI -She was taken to cath lab urgently and admitted to cardiology service -Management per cardiology  Delirium due to another medical condition -Patient became acutely delirious following cath -She previously had a bad reaction to Fentanyl, so possibly this again -PCCM has been consulted and is starting Precedex  Hyponatremia syndrome -Patient with excessive water intake, followed by n/v, in conjunction with Tak[place]bo -Likely multifactorial -Will defer to PCCM, as they have been consulted at this time  Takotsubo syndrome -Significant EF depression and findings in cath c/w Takotsubo -She did have COVID infection recently, ?post-viral cardiomyopathy -Management per cardiology       TRH will sign off at present, please call us again when needed.  PCCM was consulted prior to completion of this note so will defer management to them at this time and will be happy to reconsult when more clinically stable.  HPI: Kayla Deleon is a 71 y.o. female with past medical history of hypertension, hyperlipidemia, GERD, chronic anxiety/depression who presented to [facility] ED with complaints of nausea and chest tightness intermittently.  Due to her previously elevated calcium score, patient had CTA coronary done today in the outpatient setting.  After this, she started having headaches and worsening nausea which prompted her to go to the ED for further evaluation.     Endorses drinking lots of fluid (at least 96 ounces worth), prior to CT angio coronary.  In  the ED CT head was nonacute.  Serum sodium was 122.  An EKG revealed new LBBB, initial troponin 54, repeated 157.  EDP discussed the case with cardiology who recommended starting heparin drip and medicine team admission to [facility].  In addition to heparin drip, received IV fluid hydration.     Family reports that she has been weak and confused post-procedure.  She was vomiting after arrival in the ER but not while at home.  She is very confused.  Review of Systems: unable to review all systems due to the inability of the patient to answer questions. Past Medical History:  Diagnosis Date   Anxiety    Cervical spondylosis    evaluated in the past by neurossurgeon   Depression    Fibroids    in the past    Hypercholesteremia    Hypertension    Mental disorder    Sleep apnea    on CPAP   Past Surgical History:  Procedure Laterality Date   CESAREAN SECTION     CHOLECYSTECTOMY     LEFT HEART CATH AND CORONARY ANGIOGRAPHY N/A 12/03/2021   Procedure: LEFT HEART CATH AND CORONARY ANGIOGRAPHY;  Surgeon: Sherren Mocha, MD;  Location: [facility];  Service: Cardiovascular;  Laterality: N/A;   Social History:  reports that she quit smoking about 46 years ago. Her smoking use included cigarettes. She has a 0.10 pack-year smoking history. She has never used smokeless tobacco. She reports current alcohol use. She reports that she does not use drugs.  Allergies  Allergen Reactions   Shellfish Allergy Anaphylaxis and Rash    Pt. Uses Epipen. Pt. Uses Epipen.    Fentanyl Other (  See Comments)    Confusion, sweating   Percocet [Oxycodone-Acetaminophen] Other (See Comments)    Confusion, AMS   Statins Other (See Comments)    Severe muscle pain   Flagyl [Metronidazole Hcl] Rash    Family History  Problem Relation Age of Onset   Other Mother        brain aneurysm   Heart attack Father    Hypertension Father    Hyperlipidemia Father    Hyperlipidemia Brother    Gout  Brother    Neuropathy Neg Hx     Prior to Admission medications   Medication Sig Start Date End Date Taking? Authorizing Provider  ALBUTEROL IN Inhale into the lungs as needed.    [provider]  buPROPion (WELLBUTRIN SR) 200 MG 12 hr tablet Take 200 mg by mouth daily. 10/19/15   [provider]  CLODERM 0.1 % cream Apply 1 application topically 2 (two) times daily as needed. Reported on 10/26/2015 10/25/15   [provider]  Dextromethorphan-guaiFENesin [facility] DM PO) Take by mouth daily.    [provider]  EPINEPHrine 0.3 mg/0.3 mL IJ SOAJ injection USE AS DIRECTED FOR SHRIMP ALLERGY    [provider]  escitalopram (LEXAPRO) 20 MG tablet Take 1 tablet (20 mg total) by mouth daily. For depression and anxiety. 08/28/11   Greig Castilla, FNP  Evolocumab (REPATHA SURECLICK) 269 MG/ML SOAJ Inject 1 Dose into the skin every 14 (fourteen) days. 04/04/21   Hilty, Nadean Corwin, MD  ezetimibe (ZETIA) 10 MG tablet Take 10 mg by mouth daily. 03/18/21   [provider]  ibuprofen (ADVIL,MOTRIN) 200 MG tablet Take 400 mg by mouth every 6 (six) hours as needed. For headache    [provider]  lamoTRIgine (LAMICTAL) 25 MG tablet Take 50 mg by mouth daily.    [provider]  losartan (COZAAR) 25 MG tablet Take 25 mg by mouth every morning. 02/13/21   [provider]  metoprolol tartrate (LOPRESSOR) 100 MG tablet Take 1 pill 2 hours prior to procedure 12/02/21   Lenna Sciara, NP  Omega-3 Fatty Acids (FISH OIL PO) Take 2 capsules by mouth daily.    [provider]  pantoprazole (PROTONIX) 40 MG tablet Take 40 mg by mouth daily.    [provider]  Probiotic Product (PROBIOTIC PO) Take by mouth daily.    [provider]    Physical Exam: Vitals:   12/03/21 1400 12/03/21 1430 12/03/21 1530 12/03/21 1600  BP: 110/62 (!) 115/96 (!) 142/82 (!) 145/97  Pulse: (!) 57 83 66 81  Resp: 17 (!) 29 19 (!) 23   Temp:      TempSrc:      SpO2: 97% 98% 98% 100%  Weight:      Height:       General:  Appears confused, delirious, attempting to get out of bed Eyes:  EOMI, normal lids, iris, poor eye contact ENT:  grossly normal hearing, lips & tongue, mmm Neck:  no LAD, masses or thyromegaly Cardiovascular:  RRR, no m/r/g. No LE edema.  Respiratory:   CTA bilaterally with no wheezes/rales/rhonchi.  Normal respiratory effort. Abdomen:  soft, NT, ND Skin:  no rash or induration seen on limited exam Musculoskeletal:  grossly normal tone BUE/BLE, good ROM, no bony abnormality Psychiatric:  confused mood and affect, speech mostly inappropriate Neurologic:  unable to effectively perform   Radiological Exams on Admission: Independently reviewed - see discussion in A/P where applicable  ECHOCARDIOGRAM COMPLETE  Result Date: 12/03/2021    ECHOCARDIOGRAM REPORT   Patient Name:   RECHELLE NIEBLA Zoss Date of Exam: 12/03/2021 Medical Rec #:  [id number]      Height:       64.0 in Accession #:    [id number]     Weight:       140.0 lb Date of Birth:  09-Jun-1950       BSA:          1.681 m Patient Age:    34 years       BP:           152/63 mmHg Patient Gender: F              HR:           65 bpm. Exam Location:  Inpatient Procedure: 2D Echo, Cardiac Doppler, Color Doppler and Intracardiac            Opacification Agent Indications:    Cardiomyopathy-Unspecified I42.9  History:        Patient has no prior history of Echocardiogram examinations.                 Arrythmias:Abnormal EKG; Signs/Symptoms:Chest Pain nausea.  Sonographer:    Darlina Sicilian RDCS Referring Phys: [place]  1. The mid-to-apical inferior, mid-to-apical anterior, and mid-to-apical septal segments are akinetic. There is hypokinesis of the apical lateral segments. All basal segments demonstrate normal contractility. Pattern most consistent with takostubo cardiomyopathy. Left ventricular ejection fraction, by estimation, is 30 to 35%. The  left ventricle has moderately decreased function. The left ventricle demonstrates regional wall motion abnormalities (see scoring diagram/findings for description). Left  ventricular diastolic parameters are consistent with Grade I diastolic dysfunction (impaired relaxation).  2. Right ventricular systolic function is normal. The right ventricular size is normal.  3. The mitral valve is grossly normal. Trivial mitral valve regurgitation.  4. The aortic valve is tricuspid. There is mild calcification of the aortic valve. There is mild thickening of the aortic valve. Aortic valve regurgitation is not visualized. Aortic valve sclerosis/calcification is present, without any evidence of aortic stenosis.  5. The inferior vena cava is normal in size with greater than 50% respiratory variability, suggesting right atrial pressure of 3 mmHg. Comparison(s): No prior Echocardiogram. FINDINGS  Left Ventricle: The mid-to-apical inferior, mid-to-apical anterior, and mid-to-apical septal segments are akinetic. There is hypokinesis of the apical lateral segments. All basal segments demonstrate normal contractility. Pattern most consistent with takostubo cardiomyopathy. Left ventricular ejection fraction, by estimation, is 30 to 35%. The left ventricle has moderately decreased function. The left ventricle demonstrates regional wall motion abnormalities. Definity contrast agent was given IV to delineate the left ventricular endocardial borders. The left ventricular internal cavity size was normal in size. There is no left ventricular hypertrophy. Left ventricular diastolic parameters are consistent with Grade I diastolic dysfunction (impaired relaxation). Right Ventricle: The right ventricular size is normal. No increase in right ventricular wall thickness. Right ventricular systolic function is normal. Left Atrium: Left atrial size was normal in size. Right Atrium: Right atrial size was normal in size. Pericardium: There is no  evidence of pericardial effusion. Mitral Valve: The mitral valve is grossly normal. Trivial mitral valve regurgitation. Tricuspid Valve: The tricuspid valve is normal in structure. Tricuspid valve regurgitation is trivial. Aortic Valve: The aortic valve is tricuspid. There is mild calcification of the aortic valve. There is mild thickening of the aortic valve. Aortic valve regurgitation is not visualized. Aortic valve sclerosis/calcification  is present, without any evidence of aortic stenosis. Pulmonic Valve: The pulmonic valve was not well visualized. Pulmonic valve regurgitation is trivial. Aorta: The aortic root is normal in size and structure. Venous: The inferior vena cava is normal in size with greater than 50% respiratory variability, suggesting right atrial pressure of 3 mmHg. IAS/Shunts: The atrial septum is grossly normal.  LEFT VENTRICLE PLAX 2D LVIDd:         4.95 cm      Diastology LVIDs:         4.20 cm      LV e' medial:    5.91 cm/s LV PW:         0.80 cm      LV E/e' medial:  8.1 LV IVS:        0.65 cm      LV e' lateral:   5.91 cm/s LVOT diam:     2.00 cm      LV E/e' lateral: 8.1 LV SV:         41 LV SV Index:   24 LVOT Area:     3.14 cm  LV Volumes (MOD) LV vol d, MOD A2C: 127.0 ml LV vol d, MOD A4C: 116.0 ml LV vol s, MOD A2C: 82.0 ml LV vol s, MOD A4C: 71.2 ml LV SV MOD A2C:     45.0 ml LV SV MOD A4C:     116.0 ml LV SV MOD BP:      47.8 ml RIGHT VENTRICLE RV S prime:     12.10 cm/s TAPSE (M-mode): 2.1 cm LEFT ATRIUM             Index        RIGHT ATRIUM          Index LA diam:        2.80 cm 1.67 cm/m   RA Area:     9.52 cm LA Vol (A2C):   35.9 ml 21.35 ml/m  RA Volume:   18.20 ml 10.83 ml/m LA Vol (A4C):   30.9 ml 18.38 ml/m LA Biplane Vol: 35.1 ml 20.88 ml/m  AORTIC VALVE LVOT Vmax:   61.40 cm/s LVOT Vmean:  41.800 cm/s LVOT VTI:    0.130 m  AORTA Ao Root diam: 2.70 cm MITRAL VALVE MV Area (PHT): 2.61 cm    SHUNTS MV Decel Time: 291 msec    Systemic VTI:  0.13 m MV E velocity: 48.00  cm/s  Systemic Diam: 2.00 cm MV A velocity: 74.60 cm/s MV E/A ratio:  0.64 Gwyndolyn Kaufman MD Electronically signed by Gwyndolyn Kaufman MD Signature Date/Time: 12/03/2021/12:32:23 PM    Final    CARDIAC CATHETERIZATION  Result Date: 12/03/2021   Mid LAD lesion is 50% stenosed.   1st Diag lesion is 50% stenosed.   There is severe left ventricular systolic dysfunction.   LV end diastolic pressure is normal. 1.  Moderate nonobstructive stenosis of the mid LAD 2.  Mild nonobstructive plaquing of the left circumflex and right coronary arteries 3.  Severe segmental LV systolic dysfunction with periapical ballooning, hyperkinetic base, LVEF estimated at about 35%, findings consistent with acute Takotsubo syndrome Recommend: CV-ICU monitoring overnight, beta-blocker and ARB, aspirin 81 mg daily, continued risk reduction measures for nonobstructive CAD.  Check 2D echo.   DG Chest 2 View  Result Date: 12/02/2021 CLINICAL DATA:  Chest pain. EXAM: CHEST - 2 VIEW COMPARISON:  Chest x-ray dated July 26, 2020. FINDINGS: The heart size and mediastinal contours are within normal  limits. Both lungs are clear. The visualized skeletal structures are unremarkable. IMPRESSION: No active cardiopulmonary disease. Electronically Signed   By: Titus Dubin M.D.   On: 12/02/2021 19:56   CT Head Wo Contrast  Result Date: 12/02/2021 CLINICAL DATA:  Headache, new or worsening (Age >= 50y) EXAM: CT HEAD WITHOUT CONTRAST TECHNIQUE: Contiguous axial images were obtained from the base of the skull through the vertex without intravenous contrast. RADIATION DOSE REDUCTION: This exam was performed according to the departmental dose-optimization program which includes automated exposure control, adjustment of the mA and/or kV according to patient size and/or use of iterative reconstruction technique. COMPARISON:  None Available. FINDINGS: Brain: There is no acute intracranial hemorrhage, mass effect, or edema. Gray-white differentiation  is preserved. There is no extra-axial fluid collection. Ventricles and sulci are within normal limits in size and configuration. Patchy hypoattenuation in the supratentorial white matter is nonspecific but may reflect mild chronic microvascular ischemic changes. Vascular: There is atherosclerotic calcification at the skull base. Skull: Calvarium is unremarkable. 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 Sinuses/Orbits: No acute finding. Other: None. IMPRESSION: No acute intracranial abnormality. Mild chronic microvascular ischemic changes. 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 Electronically Signed   By: Macy Mis M.D.   On: 12/02/2021 19:56   CT CORONARY MORPH W/CTA COR W/SCORE W/CA W/CM &/OR WO/CM  Addendum Date: 12/02/2021   ADDENDUM REPORT: 12/02/2021 16:47 CLINICAL DATA:  Chest pain EXAM: Cardiac CTA MEDICATIONS: Sub lingual nitro. 4 mg and lopressor '100mg'$  TECHNIQUE: The patient was scanned on a [employer] 192 scanner. Gantry rotation speed was 250 msecs. Collimation was. 6 mm . A 120 kV prospective scan was triggered in the ascending thoracic aorta at 140 HU's with full mA between 30-70% of the R-R interval . Average HR during the scan was 66 bpm. The 3D data set was interpreted on a dedicated work station using MPR, MIP and VRT modes. A total of 80 cc of contrast was used. FINDINGS: Non-cardiac: See separate report from [facility]. No significant findings on limited lung and soft tissue windows. Calcium score: Calcium noted in RCA and LAD LM 0 LAD 47.9 RCA 81 LCX 0 Total 129 which is 79 th percentile for age/sex Coronary Arteries: Right dominant with no anomalies LM: Normal LAD: 1-24% calcified plaque proximally, 25-49% mixed plaque in mid vessel 25-49% soft plaque distally with mis registration artifact D1: Small vessel normal D2: Small vessel normal Circumflex: Normal OM1:  Normal OM2: Normal RCA: 1-24% calcified plaque in mid vessel. 1-24% mixed plaque in distal vessel Significant motion artifact present PDA: Normal PLA: Normal IMPRESSION: 1. Calcium score 129 which is 79 th percentile for age/sex 2.  Normal ascending thoracic aorta 3.2 cm 3.  CAD RADS 2 non obstructive CAD see description above [place] Electronically Signed   By: [place] M.D.   On: 12/02/2021 16:47   Result Date: 12/02/2021 EXAM: OVER-READ INTERPRETATION  CT CHEST The following report is an over-read performed by radiologist Dr. Aletta Edouard of [facility], [place] on 12/02/2021. This over-read does not include interpretation of cardiac or coronary anatomy or pathology. The coronary CTA interpretation by the cardiologist is attached. COMPARISON:  Prior calcium score study on 10/12/2019 FINDINGS: Vascular: No significant noncardiac vascular findings. Mediastinum/Nodes: Visualized mediastinum and hilar regions demonstrate no lymphadenopathy or masses. Small hiatal hernia present. Lungs/Pleura: Visualized lungs show no evidence of pulmonary edema, consolidation, pneumothorax, nodule or pleural fluid. Upper Abdomen:  No acute abnormality. Musculoskeletal: No chest wall mass or suspicious bone lesions identified. IMPRESSION: Small hiatal hernia. Electronically Signed: By: Aletta Edouard M.D. On: 12/02/2021 16:20    EKG: Independently reviewed.   1828 - NSR with rate 68; LBBB, new from 6/27 1931 - NSR with rate 63, IVCD without clear LBBB, nonspecific ST changes 0727 - NSR with rate 67; new ST elevation in V1-4 concerning for ischemia (STEMI)   Labs on Admission: I have personally reviewed the available labs and imaging studies at the time of the admission.  Pertinent labs:    Na++ 122, 123; 136 on 7/11 Glucose 185 Anion gap 18 WBC 16.1 HS troponin 54, 157, 176, 196 Lipids: 142/63/47/160 INR 1 A1c 5.8 COVID/flu negative UA: small Hgb  Family Communication: Husband and daughter were  present at the bedside and provided most of the history Primary team communication: I spoke with cardiology at the time of the consult and later then they notified me of the PCCM consult  Thank you very much for involving [place] in the care of your patient.  Author: Karmen Bongo, MD 12/03/2021 5:46 PM  For on call review [email or web address].

## 2021-12-03 NOTE — Assessment & Plan Note (Signed)
-  Patient presented with chest pain, initial concern for NSTEMI -> STEMI -She was taken to cath lab urgently and admitted to cardiology service -Management per cardiology

## 2021-12-03 NOTE — Assessment & Plan Note (Signed)
-  Patient became acutely delirious following cath -She previously had a bad reaction to Fentanyl, so possibly this again -PCCM has been consulted and is starting Precedex

## 2021-12-03 NOTE — Consult Note (Signed)
NAME:  Kayla Deleon, MRN:  [id number], DOB:  10-07-1950, LOS: 0 ADMISSION DATE:  12/02/2021, CONSULTATION DATE: 7/19 REFERRING MD: Dr. Burt Knack, CHIEF COMPLAINT: Altered mental status following cardiac cath  History of Present Illness:  71 year old female with past medical history as below, which is significant for hypertension, sleep apnea, and hypercholesterolemia.  She has had an abnormal EKG in the past for which she has been followed by cardiology as recently as June 27 of this year.  She underwent coronary CT angiogram which demonstrated nonobstructive coronary disease.  After the CT was done she developed headache and nausea.  EKG demonstrated new left bundle and labs were consistent with elevated troponin.  She was admitted to the hospital service and started on heparin infusion.  EKG was repeated and showed new changes of anterolateral ST elevation with T wave inversion.  She was taken emergently for cardiac catheterization.  Left heart cath did not demonstrate obstructive coronary disease, however, it did show changes consistent with Takotsubo cardiomyopathy.  LVEF was estimated at 35%.  Additionally after cardiac catheterization she was having increased confusion.  PCCM was consulted for further evaluation and ICU care.  Pertinent  Medical History   has a past medical history of Anxiety, Cervical spondylosis, Depression, Fibroids, Hypercholesteremia, Hypertension, Mental disorder, and Sleep apnea.   Significant Hospital Events: Including procedures, antibiotic start and stop dates in addition to other pertinent events   7/18 CT angio coronary with non-obstructive CAD. Nausea and HA after prompting ED presentation. NSTEMI. Tx started.  7/19 repeat EKG with STEMI. LHC consistent with stress cardiomyopathy. Severe confusion/agitation post procedure  Interim History / Subjective:    Objective   Blood pressure (!) 145/80, pulse 64, temperature 98.7 F (37.1 C), temperature source Oral,  resp. rate (!) 22, height '5\' 4"'$  (1.626 m), weight 63.5 kg, SpO2 93 %.        Intake/Output Summary (Last 24 hours) at 12/03/2021 1157 Last data filed at 12/02/2021 2036 Gross per 24 hour  Intake 500.17 ml  Output --  Net 500.17 ml   Filed Weights   12/02/21 1823  Weight: 63.5 kg    Examination: General: thin adult female resting comfortably HENT: [place]/AT, PERRL, no JVD Lungs: Clear bilateral breath sounds Cardiovascular: RRR, no MRG Abdomen: Soft, non-tender, non-distended Extremities: No acute deformity. Was reportedly moving all extremities with good strength prior to sedative administration.  Neuro: Sedate, RASS -3.   Resolved Hospital Problem list     Assessment & Plan:   Stress cardiomyopathy: LVEF 35% by LHC. Non-obstructive CAD despite ST elevation on EKG. No clear inciting event. Fortunately she is hemodynamically stable.  - Management per cardiology.  - losartan, metoprolol resume once taking PO - PRN hydralazine, labetalol, NTG  Acute encephalopathy: seems to have started after fentanyl administration per family report and worsened after cardiac cath. She was trying to climb out of bed and did pull IVs out. Was treated with haldol and precedex infusion was initiated prior to my arrival. This is likely medication related, and will need to wean precedex to perform a more complete neuro exam to assess for focal deficit. Cannot rule out metabolic encephalopathy related to hyponatremia. - Precedex titrated to RASS goal -1 - No further fentanyl - no need for repeat CT head at this time.  - Wean dex to allow for neuro exam.  Hyponatremia: sodium 122 on presentation, 12 hours later improved to 123. Likely hypervolemic in nature related to CHF.  - BMP every 4 hours.  -  Diuresis. Don't want to correct sodium too quickly, will start with 20 mg lasix IV. Ideal correction would be no more than 61mol/L in a 24 hour period.   OSA: on CPAP, but has not been using at home for some  time. "Waiting for a new machine" - Consider CPAP inpatient once she is more capable of compliance.   Depression - continuing home bupropion, escitalopram, lamictal once able to take PO  Best Practice (right click and "Reselect all SmartList Selections" daily)   Diet/type: NPO DVT prophylaxis: LMWH GI prophylaxis: PPI Lines: N/A Foley:  N/A Code Status:  full code Last date of multidisciplinary goals of care discussion [Family updated bedside 7/19]  Labs   CBC: Recent Labs  Lab 12/02/21 1825  WBC 16.1*  HGB 12.2  HCT 36.1  MCV 90.7  PLT 3431   Basic Metabolic Panel: Recent Labs  Lab 12/02/21 1825 12/03/21 0837  NA 122* 123*  K 4.7 4.0  CL 90* 83*  CO2 20* 22  GLUCOSE 142* 185*  BUN 22 14  CREATININE 0.97 0.74  CALCIUM 9.2 8.8*   GFR: Estimated Creatinine Clearance: 58.1 mL/min (by C-G formula based on SCr of 0.74 mg/dL). Recent Labs  Lab 12/02/21 1825  WBC 16.1*    Liver Function Tests: Recent Labs  Lab 12/02/21 1825 12/03/21 0837  AST 21 26  ALT 13 22  ALKPHOS 46 51  BILITOT 0.5 0.9  PROT 7.4 7.2  ALBUMIN 4.3 3.8   Recent Labs  Lab 12/02/21 1825  LIPASE 49   No results for input(s): "AMMONIA" in the last 168 hours.  ABG No results found for: "PHART", "PCO2ART", "PO2ART", "HCO3", "TCO2", "ACIDBASEDEF", "O2SAT"   Coagulation Profile: Recent Labs  Lab 12/03/21 0837  INR 1.0    Cardiac Enzymes: No results for input(s): "CKTOTAL", "CKMB", "CKMBINDEX", "TROPONINI" in the last 168 hours.  HbA1C: Hgb A1c MFr Bld  Date/Time Value Ref Range Status  12/03/2021 08:37 AM 5.8 (H) 4.8 - 5.6 % Final    Comment:    (NOTE) Pre diabetes:          5.7%-6.4%  Diabetes:              >6.4%  Glycemic control for   <7.0% adults with diabetes   04/15/2017 04:15 PM 5.6 4.8 - 5.6 % Final    Comment:             Prediabetes: 5.7 - 6.4          Diabetes: >6.4          Glycemic control for adults with diabetes: <7.0     CBG: No results for  input(s): "GLUCAP" in the last 168 hours.  Review of Systems:   Patient is encephalopathic and/or intubated. Therefore history has been obtained from chart review.    Past Medical History:  She,  has a past medical history of Anxiety, Cervical spondylosis, Depression, Fibroids, Hypercholesteremia, Hypertension, Mental disorder, and Sleep apnea.   Surgical History:   Past Surgical History:  Procedure Laterality Date   CESAREAN SECTION     CHOLECYSTECTOMY       Social History:   reports that she quit smoking about 46 years ago. Her smoking use included cigarettes. She has a 0.10 pack-year smoking history. She has never used smokeless tobacco. She reports current alcohol use. She reports that she does not use drugs.   Family History:  Her family history includes Gout in her brother; Heart attack in her father; Hyperlipidemia  in her brother and father; Hypertension in her father; Other in her mother. There is no history of Neuropathy.   Allergies Allergies  Allergen Reactions   Shellfish Allergy Anaphylaxis and Rash    Pt. Uses Epipen. Pt. Uses Epipen.    Statins Other (See Comments)    Severe muscle pain   Flagyl [Metronidazole Hcl] Rash     Home Medications  Prior to Admission medications   Medication Sig Start Date End Date Taking? Authorizing Provider  ALBUTEROL IN Inhale into the lungs as needed.    [provider]  buPROPion (WELLBUTRIN SR) 200 MG 12 hr tablet Take 200 mg by mouth daily. 10/19/15   [provider]  CLODERM 0.1 % cream Apply 1 application topically 2 (two) times daily as needed. Reported on 10/26/2015 10/25/15   [provider]  Dextromethorphan-guaiFENesin [facility] DM PO) Take by mouth daily.    [provider]  EPINEPHrine 0.3 mg/0.3 mL IJ SOAJ injection USE AS DIRECTED FOR SHRIMP ALLERGY    [provider]  escitalopram (LEXAPRO) 20 MG tablet Take 1 tablet (20 mg total) by mouth daily. For depression and anxiety.  08/28/11   Greig Castilla, FNP  Evolocumab (REPATHA SURECLICK) 469 MG/ML SOAJ Inject 1 Dose into the skin every 14 (fourteen) days. 04/04/21   Hilty, Nadean Corwin, MD  ezetimibe (ZETIA) 10 MG tablet Take 10 mg by mouth daily. 03/18/21   [provider]  ibuprofen (ADVIL,MOTRIN) 200 MG tablet Take 400 mg by mouth every 6 (six) hours as needed. For headache    [provider]  lamoTRIgine (LAMICTAL) 25 MG tablet Take 50 mg by mouth daily.    [provider]  losartan (COZAAR) 25 MG tablet Take 25 mg by mouth every morning. 02/13/21   [provider]  metoprolol tartrate (LOPRESSOR) 100 MG tablet Take 1 pill 2 hours prior to procedure 12/02/21   Lenna Sciara, NP  Omega-3 Fatty Acids (FISH OIL PO) Take 2 capsules by mouth daily.    [provider]  pantoprazole (PROTONIX) 40 MG tablet Take 40 mg by mouth daily.    [provider]  Probiotic Product (PROBIOTIC PO) Take by mouth daily.    [provider]     Critical care time: 42 minutes     Georgann Housekeeper, AGACNP-BC [facility]  See Amion for personal pager PCCM on call pager [phone] until 7pm. Please call Elink 7p-7a. [phone]  12/03/2021 12:49 PM

## 2021-12-04 ENCOUNTER — Other Ambulatory Visit (HOSPITAL_COMMUNITY): Payer: Self-pay

## 2021-12-04 ENCOUNTER — Telehealth (HOSPITAL_COMMUNITY): Payer: Self-pay | Admitting: Pharmacy Technician

## 2021-12-04 DIAGNOSIS — I10 Essential (primary) hypertension: Secondary | ICD-10-CM | POA: Diagnosis present

## 2021-12-04 DIAGNOSIS — I5181 Takotsubo syndrome: Secondary | ICD-10-CM | POA: Diagnosis not present

## 2021-12-04 DIAGNOSIS — J9601 Acute respiratory failure with hypoxia: Secondary | ICD-10-CM

## 2021-12-04 DIAGNOSIS — F05 Delirium due to known physiological condition: Secondary | ICD-10-CM | POA: Diagnosis not present

## 2021-12-04 DIAGNOSIS — R931 Abnormal findings on diagnostic imaging of heart and coronary circulation: Secondary | ICD-10-CM | POA: Diagnosis present

## 2021-12-04 DIAGNOSIS — E871 Hypo-osmolality and hyponatremia: Secondary | ICD-10-CM | POA: Diagnosis not present

## 2021-12-04 LAB — PHOSPHORUS: Phosphorus: 2.5 mg/dL (ref 2.5–4.6)

## 2021-12-04 LAB — BASIC METABOLIC PANEL
Anion gap: 14 (ref 5–15)
Anion gap: 12 (ref 5–15)
Anion gap: 14 (ref 5–15)
BUN: 14 mg/dL (ref 8–23)
BUN: 10 mg/dL (ref 8–23)
BUN: 9 mg/dL (ref 8–23)
CO2: 24 mmol/L (ref 22–32)
CO2: 23 mmol/L (ref 22–32)
CO2: 20 mmol/L — ABNORMAL LOW (ref 22–32)
Calcium: 8.5 mg/dL — ABNORMAL LOW (ref 8.9–10.3)
Calcium: 8.1 mg/dL — ABNORMAL LOW (ref 8.9–10.3)
Calcium: 8.3 mg/dL — ABNORMAL LOW (ref 8.9–10.3)
Chloride: 85 mmol/L — ABNORMAL LOW (ref 98–111)
Chloride: 85 mmol/L — ABNORMAL LOW (ref 98–111)
Chloride: 89 mmol/L — ABNORMAL LOW (ref 98–111)
Creatinine, Ser: 0.92 mg/dL (ref 0.44–1.00)
Creatinine, Ser: 0.92 mg/dL (ref 0.44–1.00)
Creatinine, Ser: 0.75 mg/dL (ref 0.44–1.00)
GFR, Estimated: >60 mL/min (ref >60)
GFR, Estimated: >60 mL/min (ref >60)
GFR, Estimated: >60 mL/min (ref >60)
Glucose, Bld: 132 mg/dL — ABNORMAL HIGH (ref 70–99)
Glucose, Bld: 159 mg/dL — ABNORMAL HIGH (ref 70–99)
Glucose, Bld: 124 mg/dL — ABNORMAL HIGH (ref 70–99)
Potassium: 3.6 mmol/L (ref 3.5–5.1)
Potassium: 3.2 mmol/L — ABNORMAL LOW (ref 3.5–5.1)
Potassium: 3.6 mmol/L (ref 3.5–5.1)
Sodium: 123 mmol/L — ABNORMAL LOW (ref 135–145)
Sodium: 120 mmol/L — ABNORMAL LOW (ref 135–145)
Sodium: 123 mmol/L — ABNORMAL LOW (ref 135–145)

## 2021-12-04 LAB — CBC
HCT: 33.5 % — ABNORMAL LOW (ref 36.0–46.0)
Hemoglobin: 12.4 g/dL (ref 12.0–15.0)
MCH: 31 pg (ref 26.0–34.0)
MCHC: 37 g/dL — ABNORMAL HIGH (ref 30.0–36.0)
MCV: 83.8 fL (ref 80.0–100.0)
Platelets: 256 10*3/uL (ref 150–400)
RBC: 4 MIL/uL (ref 3.87–5.11)
RDW: 12.1 % (ref 11.5–15.5)
WBC: 14.9 10*3/uL — ABNORMAL HIGH (ref 4.0–10.5)
nRBC: 0 % (ref 0.0–0.2)

## 2021-12-04 LAB — MRSA NEXT GEN BY PCR, NASAL: MRSA by PCR Next Gen: NOT DETECTED

## 2021-12-04 LAB — MAGNESIUM: Magnesium: 1.4 mg/dL — ABNORMAL LOW (ref 1.7–2.4)

## 2021-12-04 MED ORDER — SODIUM CHLORIDE 1 G PO TABS
2.0000 g | ORAL_TABLET | Freq: Once | ORAL | Status: AC
  Administered 2021-12-04: 2 g via ORAL
  Filled 2021-12-04: qty 2

## 2021-12-04 MED ORDER — LOSARTAN POTASSIUM 25 MG PO TABS
25.0000 mg | ORAL_TABLET | Freq: Every day | ORAL | Status: DC
  Administered 2021-12-04 – 2021-12-05 (×2): 25 mg via ORAL
  Filled 2021-12-04 (×2): qty 1

## 2021-12-04 MED ORDER — SODIUM CHLORIDE 1 G PO TABS
1.0000 g | ORAL_TABLET | Freq: Once | ORAL | Status: DC
Start: 2021-12-04 — End: 2021-12-04

## 2021-12-04 MED ORDER — CARVEDILOL 3.125 MG PO TABS
3.1250 mg | ORAL_TABLET | Freq: Two times a day (BID) | ORAL | Status: DC
  Administered 2021-12-04 – 2021-12-06 (×5): 3.125 mg via ORAL
  Filled 2021-12-04 (×5): qty 1

## 2021-12-04 MED ORDER — SODIUM CHLORIDE 0.9 % IV SOLN: INTRAVENOUS | Status: AC

## 2021-12-04 MED ORDER — ISOSORBIDE MONONITRATE ER 30 MG PO TB24
30.0000 mg | ORAL_TABLET | Freq: Every day | ORAL | Status: DC
Start: 2021-12-04 — End: 2021-12-06
  Administered 2021-12-04 – 2021-12-06 (×3): 30 mg via ORAL
  Filled 2021-12-04 (×3): qty 1

## 2021-12-04 MED ORDER — MAGNESIUM SULFATE 4 GM/100ML IV SOLN
4.0000 g | Freq: Once | INTRAVENOUS | Status: AC
  Administered 2021-12-04: 4 g via INTRAVENOUS
  Filled 2021-12-04: qty 100

## 2021-12-04 MED ORDER — SPIRONOLACTONE 25 MG PO TABS
25.0000 mg | ORAL_TABLET | Freq: Every day | ORAL | Status: DC
  Administered 2021-12-04 – 2021-12-06 (×3): 25 mg via ORAL
  Filled 2021-12-04 (×3): qty 1

## 2021-12-04 MED ORDER — CARVEDILOL 6.25 MG PO TABS
6.2500 mg | ORAL_TABLET | Freq: Two times a day (BID) | ORAL | Status: DC
Start: 2021-12-04 — End: 2021-12-04

## 2021-12-04 MED ORDER — POTASSIUM CHLORIDE CRYS ER 20 MEQ PO TBCR
20.0000 meq | EXTENDED_RELEASE_TABLET | Freq: Once | ORAL | Status: AC
Start: 2021-12-04 — End: 2021-12-04
  Administered 2021-12-04: 20 meq via ORAL
  Filled 2021-12-04: qty 1

## 2021-12-04 NOTE — Assessment & Plan Note (Addendum)
Started ARB and spironolactone yesterday converted from metoprolol to carvedilol.  Initial plan was to potentially convert to [facility] if possible.  Plan:   Somewhat labile blood pressures, but overall has been much improved.  Need to wait for them to stabilize and normalize on current meds.  Would not titrate further at this point.  Would like to see your sodium levels diuresis stabilize prior to considering addition of SGLT2 inhibitor.  Does not need diuretic at this time.  Euvolemic.  No diuretic required -> actually auto diuresing

## 2021-12-04 NOTE — Assessment & Plan Note (Addendum)
.   Since he was stabilized at this point.  Unsure that the hyponatremia played a role as well. . We will continue her home medications.

## 2021-12-04 NOTE — Telephone Encounter (Signed)
Pharmacy Patient Advocate Encounter  Insurance verification completed.    The patient is insured through [employer] Part D   The patient is currently admitted and ran test claims for the following: Delene Loll, Farxiga,Jardiance.  Copays and coinsurance results were relayed to Inpatient clinical team.

## 2021-12-04 NOTE — Assessment & Plan Note (Addendum)
BP has responded very well to initiation of ARB, spironolactone and conversion to carvedilol.  At this point the patient would probably not tolerate converting to [facility].  Continue with current medications for this point.

## 2021-12-04 NOTE — Assessment & Plan Note (Addendum)
    ASA, statin, beta-blocker/ARB if tolerated.

## 2021-12-04 NOTE — TOC Benefit Eligibility Note (Signed)
Patient Teacher, English as a foreign language completed.    The patient is currently admitted and upon discharge could be taking Entresto 24-26 mg.  The current 30 day co-pay is, $45.00.   The patient is currently admitted and upon discharge could be taking Farxiga 10 mg.  The current 30 day co-pay is, $45.00.   The patient is currently admitted and upon discharge could be taking Jardiance 10 mg.  The current 30 day co-pay is, $45.00.   The patient is insured through [place], [place] Patient Advocate Specialist [facility] Patient Advocate Team Direct Number: [phone]  Fax: [phone]

## 2021-12-04 NOTE — Assessment & Plan Note (Addendum)
Excellent response to hydration overnight.  Sodium level at 132 borderline normal.  Now as she begins to eat and drink her on I suspect this will improve.   Would like to see the level stabilized prior to considering SGLT2 behavior or even diuretic.  Continue to monitor closely.  If symptoms do not improve, will reconsult [facility] for transfer of ICU.

## 2021-12-04 NOTE — Progress Notes (Addendum)
NAME:  Kayla Deleon, MRN:  [id number], DOB:  [birth year], LOS: 1 ADMISSION DATE:  12/02/2021, CONSULTATION DATE: 12/03/2021 REFERRING MD: Dr. Burt Knack - Cards CHIEF COMPLAINT: AMS following cardiac cath  History of Present Illness:  71 year old female who presented to [facility] 7/18 with nausea, vomiting and hypertension. PMHx significant for HTN, OSA, and hypercholesterolemia.    She has had an abnormal EKG in the past for which she has been followed by cardiology as recently as 6/27.  She underwent coronary CT angiogram which demonstrated nonobstructive coronary disease.  After the CT was done she developed headache and nausea.  EKG demonstrated new left bundle and labs were consistent with elevated troponin.  She was admitted to the hospital service and started on heparin infusion.  EKG was repeated and showed new changes of anterolateral ST elevation with T wave inversion.  She was taken emergently for cardiac catheterization.  LHC did not demonstrate obstructive coronary disease, however, it did show changes consistent with Takotsubo cardiomyopathy.  LVEF was estimated at 35%.  Additionally after cardiac catheterization she was having increased confusion.    PCCM was consulted for further evaluation and ICU care.  Pertinent Medical History:   has a past medical history of Anxiety, Cervical spondylosis, Depression, Fibroids, Hypercholesteremia, Hypertension, Mental disorder, and Sleep apnea.  Significant Hospital Events: Including procedures, antibiotic start and stop dates in addition to other pertinent events   7/18 CT angio coronary with non-obstructive CAD. Nausea and HA after prompting ED presentation. NSTEMI. Tx started.  7/19 repeat EKG with STEMI. LHC consistent with stress cardiomyopathy. Severe confusion/agitation post procedure 7/20 Mental status improved, now A&Ox4. Mild nausea and mild chest pain/pressure, EKG unchanged.  Interim History / Subjective:  No significant overnight  events C/o mild nausea and mild chest pressure EKG unchanged, QTc improved Precedex weaned to off Mental status back to baseline Na stable, 123  Objective:  Blood pressure (!) 170/88, pulse 85, temperature 98.1 F (36.7 C), temperature source Oral, resp. rate (!) 23, height '5\' 4"'$  (1.626 m), weight 63.5 kg, SpO2 98 %.        Intake/Output Summary (Last 24 hours) at 12/04/2021 0626 Last data filed at 12/04/2021 0400 Gross per 24 hour  Intake 1272.47 ml  Output 1400 ml  Net -127.53 ml    Filed Weights   12/02/21 1823  Weight: 63.5 kg   Physical Examination: General: Well-appearing middle-aged woman in NAD. HEENT: [place]/AT, anicteric sclera, PERRL, moist mucous membranes. Neuro: Awake, oriented x 4. Responds to verbal stimuli. Following commands consistently. Moves all 4 extremities spontaneously. Strength 5/5 in all 4 extremities. CV: RRR, no m/g/r. PULM: Breathing even and unlabored on 2LNC. Lung fields CTAB. GI: Soft, nontender, nondistended. Normoactive bowel sounds. Extremities: No LE edema noted. Skin: Warm/dry, no rashes.  Resolved Hospital Problem List:     Assessment & Plan:   Stress cardiomyopathy: LVEF 35% by LHC. Non-obstructive CAD despite ST elevation on EKG. No clear inciting event. Fortunately she is hemodynamically stable.  - Management per Cardiology, primary - Resume home antihypertensives now that patient is tolerating PO (Coreg, Cozaar, Imdur) - Hydralazine, labetalol, NTG PRN  Acute encephalopathy, resolved: Seems to have started after fentanyl administration per family report and worsened after cardiac cath. She was trying to climb out of bed and did pull IVs out. Was treated with haldol and precedex infusion was initiated prior to my arrival. This is likely medication related, and will need to wean precedex to perform a more complete neuro exam to  assess for focal deficit. Cannot rule out metabolic encephalopathy related to hyponatremia. - Precedex weaned  to off - Mental status significantly improved, A&O x 4 - No further PRN Haldol administration required overnight - Avoid Fentanyl, limit narcotics - No indication for further head imaging at present  Hyponatremia: Sodium 122 on presentation, 12 hours later improved to 123. Likely hypervolemic in nature related to CHF.  - Continue BMP Q6H until Na normalized, goal 130 by 7/21AM - Ideal correction rate < 50mol/L/24H - BMP every 4 hours.  - Diuresis per Cardiology, spironolactone initiated 7/20 - May require HTS 3% versus salt tabs - Liberalize to regular diet  Hypercholesterolemia - ASA, statin  GERD Nausea - Zofran, Phenergan PRN - PPI  OSA: on CPAP, but has not been using at home for some time. "Waiting for a new machine" - CPAP QHS while inpatient - Supplemental O2 PRN to maintain SpO2 > 90%  Depression - Continue home bupropion, escitalopram, Lamictal  Best Practice (right click and "Reselect all SmartList Selections" daily)   Diet/type: Regular consistency (see orders), Heart Healthy DVT prophylaxis: LMWH GI prophylaxis: PPI Lines: N/A Foley:  N/A Code Status:  full code Last date of multidisciplinary goals of care discussion [Family updated bedside 7/20]  Critical care time: N/A   [facility] 12/04/21 8:13 AM  Please see [email or web address] for pager details.  From 7A-7P if no response, please call [phone] After hours, please call ELink [phone]

## 2021-12-04 NOTE — Progress Notes (Signed)
Progress Note  Patient Name: Kayla Deleon Date of Encounter: 12/04/2021  [facility] Cardiologist: Pixie Casino, MD    Patient Profile     71 y.o. female with history of nonobstructive CAD by recent Coronary CTA seen in the morning of 12/03/2021 for evaluation of chest pain/possible STEMI when dynamic EKG changes concerning for anterior MI, recent. => Taken to the catheter lab for emergent catheterization revealing nonobstructive CAD.  Anterior and anteroapical hypokinesis not on LV gram and confirmed on echo consistent with Takotsubo.  Assessment & Plan    Principal Problem:   Takotsubo cardiomyopathy  =>  (Initial Dx was Possible Anterior STEMI - Changed) Active Problems:   Accelerated hypertension   Hyponatremia syndrome   Agatston CAC score 100-199   Major depressive disorder, recurrent (Kayla Deleon)   Generalized anxiety disorder   Delirium due to another medical condition  Hospital Problem List as of 12/04/2021          Priority Resolved POA     1.     * (Principal) Takotsubo cardiomyopathy  =>  (Initial Dx was Possible Anterior STEMI - Changed) 1.  Yes    Current Assessment & Plan 12/02/2021 Hospital Encounter Written 12/04/2021 11:09 PM by Kayla Man, MD     Most likely diagnosis Kayla Deleon is quite stressed out about multiple things, but the true etiology is difficult to assess.  Regardless, the appearance is clearly Takotsubo and therefore will initiate therapy for the cardiomyopathy regardless etiology.  Continue treatment of anxiety depression etc.  Plan: Adding ARB and carvedilol (convert from metoprolol to carvedilol), and spironolactone today. => Would like to potentially convert to Kayla Deleon tomorrow stable. Euvolemic.  No diuretic required. Consider the possibility of SGLT2 inhibitor/GLP-1 agonist.        2.     Accelerated hypertension 2.  Yes    Current Assessment & Plan 12/02/2021 Hospital Encounter Written 12/04/2021 11:06 PM by Kayla Man,  MD     Notably elevated blood pressure on admission, still little elevated, notably improved today.  We will add low-dose carvedilol, low-dose losartan, along with spironolactone and Imdur (Imdur for possible spasm related Takotsubo) => With reduced EF, more likely consider converting from ARB to [facility] if possible.        3.     Hyponatremia syndrome 3.  Yes    Current Assessment & Plan 12/02/2021 Hospital Encounter Written 12/04/2021 11:01 PM by Kayla Man, MD      Remains hyponatremic - with Na in low 120s despite intial Rx with IV NS Managed by PCCM, I also have consulted TRH.        4.     Agatston CAC score 100-199 4.  Yes    Current Assessment & Plan 12/02/2021 Hospital Encounter Written 12/04/2021 11:04 PM by Kayla Man, MD     She did indeed have moderate disease on cath and Coronary CTA.   Recommend continue guideline directed medical therapy.  ASA, statin, beta-blocker/ARB if tolerated.        5.     Major depressive disorder, recurrent (Kayla Deleon) 5.  Yes     Generalized anxiety disorder 5.  Yes     Delirium due to another medical condition 5.  No    Overview Signed 12/04/2021 11:00 PM by Kayla Man, MD     Significant postcatheterization delirium possibly related to sedation (thought to be a reaction to fentanyland underlying anxiety.  This required sedation with Precedex ) and restraints. =>  Now resolved but still remains extremely anxious.       Current Assessment & Plan 12/02/2021 Hospital Encounter Edited 12/04/2021 11:00 PM by Kayla Man, MD     Significant postcatheterization delirium possibly related to sedation (thought to be a reaction to fentanyland underlying anxiety.  This required sedation with Precedex ) and restraints. => Now resolved but still remains extremely anxious.  Defer management to PCCM/TRH. => On several psychotropic meds.          Subjective   Yesterday's post catheter events noted.  Seen by PCCM for extreme agitation  likely related to some type of interaction with sedation.  This required restraints and sedation with Precedex.  This has been weaned off and she is actually awake and alert today doing well.  Still relatively anxious, not nearly as concerning.  Doing much better.  No chest pain or pressure or significant dyspnea.  Inpatient Medications    Scheduled Meds:  aspirin EC  81 mg Oral Daily   buPROPion  200 mg Oral Daily   carvedilol  3.125 mg Oral BID WC   Chlorhexidine Gluconate Cloth  6 each Topical Daily   enoxaparin (LOVENOX) injection  40 mg Subcutaneous Q24H   escitalopram  20 mg Oral Daily   ezetimibe  10 mg Oral Daily   isosorbide mononitrate  30 mg Oral Daily   lamoTRIgine  50 mg Oral Daily   losartan  25 mg Oral QHS   pantoprazole  40 mg Oral Daily   sodium chloride flush  3 mL Intravenous Q12H   spironolactone  25 mg Oral Daily  Carvedilol, Imdur and spironolactone started today. Continuous Infusions:  sodium chloride     sodium chloride 50 mL/hr at 12/04/21 2300   PRN Meds: sodium chloride, acetaminophen, nitroGLYCERIN, ondansetron (ZOFRAN) IV, mouth rinse, sodium chloride flush   Vital Signs    Vitals:   12/04/21 2000 12/04/21 2100 12/04/21 2200 12/04/21 2258  BP: (!) 152/76 (!) 153/89 128/65 (!) 155/82  Pulse: 72 84 75 89  Resp: (!) 21 (!) 21 (!) 23 18  Temp: 98.2 F (36.8 C)     TempSrc: Oral     SpO2: 94% 92% 92% 95%  Weight:      Height:        Intake/Output Summary (Last 24 hours) at 12/04/2021 2318 Last data filed at 12/04/2021 2300 Gross per 24 hour  Intake 874.29 ml  Output 5300 ml  Net -4425.71 ml      12/02/2021    6:23 PM 11/11/2021    8:06 AM 06/26/2021    9:05 AM  Last 3 Weights  Weight (lbs) 140 lb 140 lb 9.6 oz 138 lb 12.8 oz  Weight (kg) 63.504 kg 63.776 kg 62.959 kg      Telemetry    Sinus rhythm- Personally Reviewed  ECG    NSR, rate 79 bpm.  Anterior and anterolateral ST and T wave changes concerning for evolutionary changes of  anterior/anteroseptal MI, recent.- Personally Reviewed  Physical Exam   GEN: Awake and alert x3 very anxious, lying in bed in no acute distress. Neck: No JVD or bruit.  No significant HJR. Cardiac: RRR, normal S1-S2 no murmurs, rubs, or gallops.  Respiratory: Clear to auscultation bilaterally.  Nonlabored, good air movement. GI: Soft, nontender, non-distended  MS: No significant; No deformity. Neuro:  Nonfocal  Psych: Very anxious appearing uptight.  Not very talkative.  Very anxious affect  Cardiac Studies   Cardiac cath 12/03/2021: Mid LAD 50%, D1  percent.  Severe LV dysfunction estimated 35% with berry apical ballooning and basal hyperkinesis.  TTE 12/04/2018: EF estimated 3035% mid-apical inferior, mid-apical anterior, mid-apical septal wall segments are all akinetic.  Apical lateral segments hypokinesis.  Normal/hyperdynamic basal segments. Findings c/w Takotsubo Cardiomyopathy.  GR 1 DD.  Normal RV, MV with mild aortic valve calcification and otherwise normal RAP/CVP.  Labs    High Sensitivity Troponin:   Recent Labs  Lab 12/02/21 1825 12/02/21 2035 12/03/21 0231 12/03/21 0503  TROPONINIHS 54* 157* 176* 196*     Chemistry Recent Labs  Lab 12/02/21 1825 12/03/21 0837 12/03/21 0839 12/04/21 0615 12/04/21 1150 12/04/21 1817  NA 122* 123*   < > 123* 120* 123*  K 4.7 4.0   < > 3.6 3.2* 3.6  CL 90* 83*   < > 85* 85* 89*  CO2 20* 22   < > 24 23 20*  GLUCOSE 142* 185*   < > 132* 159* 124*  BUN 22 14   < > '14 10 9  '$ CREATININE 0.97 0.74   < > 0.92 0.92 0.75  CALCIUM 9.2 8.8*   < > 8.5* 8.1* 8.3*  MG  --   --   --  1.4*  --   --   PROT 7.4 7.2  --   --   --   --   ALBUMIN 4.3 3.8  --   --   --   --   AST 21 26  --   --   --   --   ALT 13 22  --   --   --   --   ALKPHOS 46 51  --   --   --   --   BILITOT 0.5 0.9  --   --   --   --   GFRNONAA >60 >60   < > >60 >60 >60  ANIONGAP 12 18*   < > '14 12 14   '$ < > = values in this interval not displayed.    Lipids  Recent  Labs  Lab 12/03/21 0837  CHOL 142  TRIG 160*  HDL 63  LDLCALC 47  CHOLHDL 2.3    Hematology Recent Labs  Lab 12/02/21 1825 12/03/21 0839 12/03/21 1545 12/04/21 0615  WBC 16.1*  --  14.3* 14.9*  RBC 3.98  --  4.05 4.00  HGB 12.2 13.6 12.3 12.4  HCT 36.1 40.0 34.4* 33.5*  MCV 90.7  --  84.9 83.8  MCH 30.7  --  30.4 31.0  MCHC 33.8  --  35.8 37.0*  RDW 12.4  --  12.0 12.1  PLT 335  --  289 256   Thyroid No results for input(s): "TSH", "FREET4" in the last 168 hours.  BNPNo results for input(s): "BNP", "PROBNP" in the last 168 hours.  DDimer No results for input(s): "DDIMER" in the last 168 hours.   Radiology    ECHOCARDIOGRAM COMPLETE  Result Date: 12/03/2021    ECHOCARDIOGRAM REPORT   Patient Name:   Kayla Deleon Date of Exam: 12/03/2021 Medical Rec #:  [id number]      Height:       64.0 in Accession #:    [id number]     Weight:       140.0 lb Date of Birth:  1951-01-23       BSA:          1.681 m Patient Age:    63 years       BP:  152/63 mmHg Patient Gender: F              HR:           65 bpm. Exam Location:  Inpatient Procedure: 2D Echo, Cardiac Doppler, Color Doppler and Intracardiac            Opacification Agent Indications:    Cardiomyopathy-Unspecified I42.9  History:        Patient has no prior history of Echocardiogram examinations.                 Arrythmias:Abnormal EKG; Signs/Symptoms:Chest Pain nausea.  Sonographer:    Darlina Sicilian RDCS Referring Phys: [place]  1. The mid-to-apical inferior, mid-to-apical anterior, and mid-to-apical septal segments are akinetic. There is hypokinesis of the apical lateral segments. All basal segments demonstrate normal contractility. Pattern most consistent with takostubo cardiomyopathy. Left ventricular ejection fraction, by estimation, is 30 to 35%. The left ventricle has moderately decreased function. The left ventricle demonstrates regional wall motion abnormalities (see scoring diagram/findings for  description). Left  ventricular diastolic parameters are consistent with Grade I diastolic dysfunction (impaired relaxation).  2. Right ventricular systolic function is normal. The right ventricular size is normal.  3. The mitral valve is grossly normal. Trivial mitral valve regurgitation.  4. The aortic valve is tricuspid. There is mild calcification of the aortic valve. There is mild thickening of the aortic valve. Aortic valve regurgitation is not visualized. Aortic valve sclerosis/calcification is present, without any evidence of aortic stenosis.  5. The inferior vena cava is normal in size with greater than 50% respiratory variability, suggesting right atrial pressure of 3 mmHg. Comparison(s): No prior Echocardiogram. FINDINGS  Left Ventricle: The mid-to-apical inferior, mid-to-apical anterior, and mid-to-apical septal segments are akinetic. There is hypokinesis of the apical lateral segments. All basal segments demonstrate normal contractility. Pattern most consistent with takostubo cardiomyopathy. Left ventricular ejection fraction, by estimation, is 30 to 35%. The left ventricle has moderately decreased function. The left ventricle demonstrates regional wall motion abnormalities. Definity contrast agent was given IV to delineate the left ventricular endocardial borders. The left ventricular internal cavity size was normal in size. There is no left ventricular hypertrophy. Left ventricular diastolic parameters are consistent with Grade I diastolic dysfunction (impaired relaxation). Right Ventricle: The right ventricular size is normal. No increase in right ventricular wall thickness. Right ventricular systolic function is normal. Left Atrium: Left atrial size was normal in size. Right Atrium: Right atrial size was normal in size. Pericardium: There is no evidence of pericardial effusion. Mitral Valve: The mitral valve is grossly normal. Trivial mitral valve regurgitation. Tricuspid Valve: The tricuspid valve  is normal in structure. Tricuspid valve regurgitation is trivial. Aortic Valve: The aortic valve is tricuspid. There is mild calcification of the aortic valve. There is mild thickening of the aortic valve. Aortic valve regurgitation is not visualized. Aortic valve sclerosis/calcification is present, without any evidence of aortic stenosis. Pulmonic Valve: The pulmonic valve was not well visualized. Pulmonic valve regurgitation is trivial. Aorta: The aortic root is normal in size and structure. Venous: The inferior vena cava is normal in size with greater than 50% respiratory variability, suggesting right atrial pressure of 3 mmHg. IAS/Shunts: The atrial septum is grossly normal.  LEFT VENTRICLE PLAX 2D LVIDd:         4.95 cm      Diastology LVIDs:         4.20 cm      LV e' medial:    5.91  cm/s LV PW:         0.80 cm      LV E/e' medial:  8.1 LV IVS:        0.65 cm      LV e' lateral:   5.91 cm/s LVOT diam:     2.00 cm      LV E/e' lateral: 8.1 LV SV:         41 LV SV Index:   24 LVOT Area:     3.14 cm  LV Volumes (MOD) LV vol d, MOD A2C: 127.0 ml LV vol d, MOD A4C: 116.0 ml LV vol s, MOD A2C: 82.0 ml LV vol s, MOD A4C: 71.2 ml LV SV MOD A2C:     45.0 ml LV SV MOD A4C:     116.0 ml LV SV MOD BP:      47.8 ml RIGHT VENTRICLE RV S prime:     12.10 cm/s TAPSE (M-mode): 2.1 cm LEFT ATRIUM             Index        RIGHT ATRIUM          Index LA diam:        2.80 cm 1.67 cm/m   RA Area:     9.52 cm LA Vol (A2C):   35.9 ml 21.35 ml/m  RA Volume:   18.20 ml 10.83 ml/m LA Vol (A4C):   30.9 ml 18.38 ml/m LA Biplane Vol: 35.1 ml 20.88 ml/m  AORTIC VALVE LVOT Vmax:   61.40 cm/s LVOT Vmean:  41.800 cm/s LVOT VTI:    0.130 m  AORTA Ao Root diam: 2.70 cm MITRAL VALVE MV Area (PHT): 2.61 cm    SHUNTS MV Decel Time: 291 msec    Systemic VTI:  0.13 m MV E velocity: 48.00 cm/s  Systemic Diam: 2.00 cm MV A velocity: 74.60 cm/s MV E/A ratio:  0.64 Gwyndolyn Kaufman MD Electronically signed by Gwyndolyn Kaufman MD Signature  Date/Time: 12/03/2021/12:32:23 PM    Final    CARDIAC CATHETERIZATION  Result Date: 12/03/2021   Mid LAD lesion is 50% stenosed.   1st Diag lesion is 50% stenosed.   There is severe left ventricular systolic dysfunction.   LV end diastolic pressure is normal. 1.  Moderate nonobstructive stenosis of the mid LAD 2.  Mild nonobstructive plaquing of the left circumflex and right coronary arteries 3.  Severe segmental LV systolic dysfunction with periapical ballooning, hyperkinetic base, LVEF estimated at about 35%, findings consistent with acute Takotsubo syndrome Recommend: CV-ICU monitoring overnight, beta-blocker and ARB, aspirin 81 mg daily, continued risk reduction measures for nonobstructive CAD.  Check 2D echo.     For questions or updates, please contact [place] Please consult [email or web address] for contact info under        Signed, Glenetta Hew, MD  12/04/2021, 11:18 PM

## 2021-12-05 DIAGNOSIS — I10 Essential (primary) hypertension: Secondary | ICD-10-CM | POA: Diagnosis not present

## 2021-12-05 DIAGNOSIS — R931 Abnormal findings on diagnostic imaging of heart and coronary circulation: Secondary | ICD-10-CM

## 2021-12-05 DIAGNOSIS — F411 Generalized anxiety disorder: Secondary | ICD-10-CM

## 2021-12-05 DIAGNOSIS — E871 Hypo-osmolality and hyponatremia: Secondary | ICD-10-CM | POA: Diagnosis not present

## 2021-12-05 DIAGNOSIS — F05 Delirium due to known physiological condition: Secondary | ICD-10-CM | POA: Diagnosis not present

## 2021-12-05 DIAGNOSIS — I5181 Takotsubo syndrome: Secondary | ICD-10-CM | POA: Diagnosis not present

## 2021-12-05 DIAGNOSIS — F3341 Major depressive disorder, recurrent, in partial remission: Secondary | ICD-10-CM

## 2021-12-05 LAB — BASIC METABOLIC PANEL
Anion gap: 11 (ref 5–15)
Anion gap: 10 (ref 5–15)
Anion gap: 12 (ref 5–15)
BUN: 7 mg/dL — ABNORMAL LOW (ref 8–23)
BUN: 7 mg/dL — ABNORMAL LOW (ref 8–23)
BUN: 9 mg/dL (ref 8–23)
CO2: 22 mmol/L (ref 22–32)
CO2: 25 mmol/L (ref 22–32)
CO2: 22 mmol/L (ref 22–32)
Calcium: 7.9 mg/dL — ABNORMAL LOW (ref 8.9–10.3)
Calcium: 8.7 mg/dL — ABNORMAL LOW (ref 8.9–10.3)
Calcium: 8.5 mg/dL — ABNORMAL LOW (ref 8.9–10.3)
Chloride: 94 mmol/L — ABNORMAL LOW (ref 98–111)
Chloride: 97 mmol/L — ABNORMAL LOW (ref 98–111)
Chloride: 98 mmol/L (ref 98–111)
Creatinine, Ser: 0.66 mg/dL (ref 0.44–1.00)
Creatinine, Ser: 0.77 mg/dL (ref 0.44–1.00)
Creatinine, Ser: 0.88 mg/dL (ref 0.44–1.00)
GFR, Estimated: >60 mL/min (ref >60)
GFR, Estimated: >60 mL/min (ref >60)
GFR, Estimated: >60 mL/min (ref >60)
Glucose, Bld: 116 mg/dL — ABNORMAL HIGH (ref 70–99)
Glucose, Bld: 114 mg/dL — ABNORMAL HIGH (ref 70–99)
Glucose, Bld: 207 mg/dL — ABNORMAL HIGH (ref 70–99)
Potassium: 3.5 mmol/L (ref 3.5–5.1)
Potassium: 3.8 mmol/L (ref 3.5–5.1)
Potassium: 3.7 mmol/L (ref 3.5–5.1)
Sodium: 127 mmol/L — ABNORMAL LOW (ref 135–145)
Sodium: 132 mmol/L — ABNORMAL LOW (ref 135–145)
Sodium: 132 mmol/L — ABNORMAL LOW (ref 135–145)

## 2021-12-05 LAB — CBC
HCT: 38.1 % (ref 36.0–46.0)
Hemoglobin: 13.1 g/dL (ref 12.0–15.0)
MCH: 30.3 pg (ref 26.0–34.0)
MCHC: 34.4 g/dL (ref 30.0–36.0)
MCV: 88 fL (ref 80.0–100.0)
Platelets: 297 10*3/uL (ref 150–400)
RBC: 4.33 MIL/uL (ref 3.87–5.11)
RDW: 12.4 % (ref 11.5–15.5)
WBC: 9.5 10*3/uL (ref 4.0–10.5)
nRBC: 0 % (ref 0.0–0.2)

## 2021-12-05 LAB — MAGNESIUM: Magnesium: 2.4 mg/dL (ref 1.7–2.4)

## 2021-12-05 MED ORDER — POTASSIUM CHLORIDE CRYS ER 20 MEQ PO TBCR
20.0000 meq | EXTENDED_RELEASE_TABLET | Freq: Once | ORAL | Status: AC
Start: 2021-12-05 — End: 2021-12-05
  Administered 2021-12-05: 20 meq via ORAL
  Filled 2021-12-05: qty 1

## 2021-12-05 NOTE — Progress Notes (Signed)
Progress Note  Patient Name: Kayla Deleon Date of Encounter: 12/05/2021  [facility] Cardiologist: Pixie Casino, MD    Patient Profile     71 y.o. female with history of nonobstructive CAD by recent Coronary CTA seen in the morning of 12/03/2021 for evaluation of chest pain/possible STEMI when dynamic EKG changes concerning for anterior MI, recent. => Taken to the catheter lab for emergent catheterization revealing nonobstructive CAD.  Anterior and anteroapical hypokinesis not on LV gram and confirmed on echo consistent with Takotsubo.  Assessment & Plan    Principal Problem:   Takotsubo cardiomyopathy  =>  (Initial Dx was Possible Anterior STEMI - Changed) Active Problems:   Accelerated hypertension   Hyponatremia syndrome   Agatston CAC score 100-199   Major depressive disorder, recurrent ([place])   Generalized anxiety disorder   Delirium due to another medical condition  Hospital Problem List as of 12/05/2021          Priority Resolved POA     1.     * (Principal) Takotsubo cardiomyopathy  =>  (Initial Dx was Possible Anterior STEMI - Changed) 1.  Yes    Overview Signed 12/05/2021  9:06 AM by Leonie Man, MD     Most likely diagnosis Kayla Deleon is quite stressed out about multiple things, but the true etiology is difficult to assess.  Regardless, the appearance is clearly Takotsubo and therefore will initiate therapy for the cardiomyopathy regardless etiology.       Current Assessment & Plan 12/02/2021 Hospital Encounter Edited 12/05/2021  9:13 AM by Leonie Man, MD     Started ARB and spironolactone yesterday converted from metoprolol to carvedilol.  Initial plan was to potentially convert to [facility] if possible.  Plan:  Somewhat labile blood pressures, but overall has been much improved.  Need to wait for them to stabilize and normalize on current meds.  Would not titrate further at this point. Would like to see your sodium levels diuresis stabilize prior  to considering addition of SGLT2 inhibitor. Does not need diuretic at this time.  Euvolemic.  No diuretic required -> actually auto diuresing         2.     Accelerated hypertension 2.  Yes    Current Assessment & Plan 12/02/2021 Hospital Encounter Edited 12/05/2021  9:14 AM by Leonie Man, MD     BP has responded very well to initiation of ARB, spironolactone and conversion to carvedilol.  At this point the patient would probably not tolerate converting to [facility].  Continue with current medications for this point.        3.     Hyponatremia syndrome 3.  Yes    Current Assessment & Plan 12/02/2021 Hospital Encounter Edited 12/05/2021  9:15 AM by Leonie Man, MD     Excellent response to hydration overnight.  Sodium level at 132 borderline normal.  Now as she begins to eat and drink her on I suspect this will improve.   Would like to see the level stabilized prior to considering SGLT2 behavior or even diuretic.  Continue to monitor closely.  If symptoms do not improve, will reconsult TRH for transfer of ICU.        4.     Agatston CAC score 100-199 4.  Yes    Overview Signed 12/05/2021  9:16 AM by Leonie Man, MD     She does indeed have moderate disease on cath and Coronary CTA.   Recommend continue  guideline directed medical therapy.  Aspirin, statin, beta-blocker and ARB on board       Current Assessment & Plan 12/02/2021 Hospital Encounter Edited 12/05/2021  9:15 AM by Leonie Man, MD       ASA, statin, beta-blocker/ARB if tolerated.        5.     Major depressive disorder, recurrent ([place]) 5.  Yes     Generalized anxiety disorder 5.  Yes     Delirium due to another medical condition 5.  No    Overview Signed 12/04/2021 11:00 PM by Leonie Man, MD     Significant postcatheterization delirium possibly related to sedation (thought to be a reaction to fentanyland underlying anxiety.  This required sedation with Precedex ) and restraints. => Now resolved  but still remains extremely anxious.       Current Assessment & Plan 12/02/2021 Hospital Encounter Edited 12/05/2021  9:16 AM by Leonie Man, MD     Since he was stabilized at this point.  Unsure that the hyponatremia played a role as well. We will continue her home medications.         Subjective   Significant diuresis over 4 L out yesterday-despite salt loading for hyponatremia.Marland Kitchen Kayla Deleon spironolactone 25 mg, losartan 20 mg initiated, and beta-blocker converted to carvedilol 3.125 mg) Continued in the ICU due to ongoing hyponatremia Feeling much better today.  Sitting up in the chair having arrest.  No dyspnea or chest pain.   Inpatient Medications    Scheduled Meds:  aspirin EC  81 mg Oral Daily   buPROPion  200 mg Oral Daily   carvedilol  3.125 mg Oral BID WC   Chlorhexidine Gluconate Cloth  6 each Topical Daily   enoxaparin (LOVENOX) injection  40 mg Subcutaneous Q24H   escitalopram  20 mg Oral Daily   ezetimibe  10 mg Oral Daily   isosorbide mononitrate  30 mg Oral Daily   lamoTRIgine  50 mg Oral Daily   losartan  25 mg Oral QHS   pantoprazole  40 mg Oral Daily   potassium chloride  20 mEq Oral Once   sodium chloride flush  3 mL Intravenous Q12H   spironolactone  25 mg Oral Daily  Carvedilol, Imdur and spironolactone started today. Continuous Infusions:  sodium chloride 10 mL/hr at 12/05/21 0600   PRN Meds: sodium chloride, acetaminophen, nitroGLYCERIN, ondansetron (ZOFRAN) IV, mouth rinse, sodium chloride flush   Vital Signs    Vitals:   12/05/21 0400 12/05/21 0500 12/05/21 0600 12/05/21 0800  BP: 120/68 (!) 157/82 (!) 154/93 125/61  Pulse: 74 86 78 79  Resp: (!) 21 (!) 25 (!) 22 17  Temp:  98.6 F (37 C)  98.5 F (36.9 C)  TempSrc:  Oral    SpO2: 97% 93% 94% 92%  Weight:   62.4 kg   Height:   '5\' 4"'$  (1.626 m)     Intake/Output Summary (Last 24 hours) at 12/05/2021 0920 Last data filed at 12/05/2021 0600 Gross per 24 hour  Intake 1270.49 ml   Output 4900 ml  Net -3629.51 ml      12/05/2021    6:00 AM 12/02/2021    6:23 PM 11/11/2021    8:06 AM  Last 3 Weights  Weight (lbs) 137 lb 9.1 oz 140 lb 140 lb 9.6 oz  Weight (kg) 62.4 kg 63.504 kg 63.776 kg      Telemetry    Sinus rhythm-80s personally Reviewed  ECG    NSR, rate  84 bpm.  Persistent anterior and anterolateral ST and T wave (persistent ST elevation with T wave inversion) changes concerning for evolutionary changes of anterior/anteroseptal MI, recent.- Personally Reviewed  Physical Exam   GEN: Awake and alert x3 , reclining in the bedside chair.  No obvious distress.  Seems less anxious. Neck: No JVD or bruit.  No significant HJR. Cardiac: RRR, normal S1 and S2.  No M/R/G. Respiratory: CTA B, nonlabored, good air movement.   GI: Soft/NT/ND/BS.  No HSM. MS: Mild ecchymosis at the access sites and some at the IV sites.  But no significant hematoma. Neuro:  Nonfocal  Psych: Notably less anxious.  Seems much more calm.  Resting comfortably.  Cardiac Studies   Cardiac cath 12/03/2021: Mid LAD 50%, D1 percent.  Severe LV dysfunction estimated 35% with berry apical ballooning and basal hyperkinesis.  TTE 12/04/2018: EF estimated 3035% mid-apical inferior, mid-apical anterior, mid-apical septal wall segments are all akinetic.  Apical lateral segments hypokinesis.  Normal/hyperdynamic basal segments. Findings c/w Takotsubo Cardiomyopathy.  GR 1 DD.  Normal RV, MV with mild aortic valve calcification and otherwise normal RAP/CVP.  Labs    High Sensitivity Troponin:   Recent Labs  Lab 12/02/21 1825 12/02/21 2035 12/03/21 0231 12/03/21 0503  TROPONINIHS 54* 157* 176* 196*     Chemistry Recent Labs  Lab 12/02/21 1825 12/03/21 0837 12/03/21 0839 12/04/21 0615 12/04/21 1150 12/04/21 1817 12/05/21 0111 12/05/21 0644  NA 122* 123*   < > 123*   < > 123* 127* 132*  K 4.7 4.0   < > 3.6   < > 3.6 3.5 3.8  CL 90* 83*   < > 85*   < > 89* 94* 97*  CO2 20* 22   < >  24   < > 20* 22 25  GLUCOSE 142* 185*   < > 132*   < > 124* 116* 114*  BUN 22 14   < > 14   < > 9 7* 7*  CREATININE 0.97 0.74   < > 0.92   < > 0.75 0.66 0.77  CALCIUM 9.2 8.8*   < > 8.5*   < > 8.3* 7.9* 8.7*  MG  --   --   --  1.4*  --   --   --  2.4  PROT 7.4 7.2  --   --   --   --   --   --   ALBUMIN 4.3 3.8  --   --   --   --   --   --   AST 21 26  --   --   --   --   --   --   ALT 13 22  --   --   --   --   --   --   ALKPHOS 46 51  --   --   --   --   --   --   BILITOT 0.5 0.9  --   --   --   --   --   --   GFRNONAA >60 >60   < > >60   < > >60 >60 >60  ANIONGAP 12 18*   < > 14   < > '14 11 10   '$ < > = values in this interval not displayed.    Lipids  Recent Labs  Lab 12/03/21 0837  CHOL 142  TRIG 160*  HDL 63  LDLCALC 47  CHOLHDL 2.3  Hematology Recent Labs  Lab 12/03/21 1545 12/04/21 0615 12/05/21 0644  WBC 14.3* 14.9* 9.5  RBC 4.05 4.00 4.33  HGB 12.3 12.4 13.1  HCT 34.4* 33.5* 38.1  MCV 84.9 83.8 88.0  MCH 30.4 31.0 30.3  MCHC 35.8 37.0* 34.4  RDW 12.0 12.1 12.4  PLT 289 256 297   Thyroid No results for input(s): "TSH", "FREET4" in the last 168 hours.  BNPNo results for input(s): "BNP", "PROBNP" in the last 168 hours.  DDimer No results for input(s): "DDIMER" in the last 168 hours.   Radiology    No new studies   For questions or updates, please contact [place] Please consult [email or web address] for contact info under        Signed, Glenetta Hew, MD  12/05/2021, 9:20 AM

## 2021-12-05 NOTE — Progress Notes (Signed)
NAME:  Kayla Deleon, MRN:  [id number], DOB:  06/09/1950, LOS: 2 ADMISSION DATE:  12/02/2021, CONSULTATION DATE: 12/03/2021 REFERRING MD: Dr. Burt Knack - Cards CHIEF COMPLAINT: AMS following cardiac cath  History of Present Illness:  71 year old female who presented to [facility] 7/18 with nausea, vomiting and hypertension. PMHx significant for HTN, OSA, and hypercholesterolemia.    She has had an abnormal EKG in the past for which she has been followed by cardiology as recently as 6/27.  She underwent coronary CT angiogram which demonstrated nonobstructive coronary disease.  After the CT was done she developed headache and nausea.  EKG demonstrated new left bundle and labs were consistent with elevated troponin.  She was admitted to the hospital service and started on heparin infusion.  EKG was repeated and showed new changes of anterolateral ST elevation with T wave inversion.  She was taken emergently for cardiac catheterization.  LHC did not demonstrate obstructive coronary disease, however, it did show changes consistent with Takotsubo cardiomyopathy.  LVEF was estimated at 35%.  Additionally after cardiac catheterization she was having increased confusion.    PCCM was consulted for further evaluation and ICU care.  Pertinent Medical History:   has a past medical history of Anxiety, Cervical spondylosis, Depression, Fibroids, Hypercholesteremia, Hypertension, Mental disorder, and Sleep apnea.  Significant Hospital Events: Including procedures, antibiotic start and stop dates in addition to other pertinent events   7/18 CT angio coronary with non-obstructive CAD. Nausea and HA after prompting ED presentation. NSTEMI. Tx started.  7/19 repeat EKG with STEMI. LHC consistent with stress cardiomyopathy. Severe confusion/agitation post procedure 7/20 Mental status improved, now A&Ox4. Mild nausea and mild chest pain/pressure, EKG unchanged. 7/21 Mental status returned to baseline. A&Ox4, nausea and  chest pain resolved. EKG unchanged. Na improved to 132 with NS.   Interim History / Subjective:  No significant events overnight Feeling much better - slept well overnight, no further nausea/chest pain, appetite is improved EKG remains stable Na uptrending appropriately, 132 (127) (123) Stable for transfer to floor today PCCM will sign off  Objective:  Blood pressure (!) 154/93, pulse 78, temperature 98.6 F (37 C), temperature source Oral, resp. rate (!) 22, height '5\' 4"'$  (1.626 m), weight 62.4 kg, SpO2 94 %.        Intake/Output Summary (Last 24 hours) at 12/05/2021 0725 Last data filed at 12/05/2021 0600 Gross per 24 hour  Intake 1510.49 ml  Output 5550 ml  Net -4039.51 ml    Filed Weights   12/02/21 1823 12/05/21 0600  Weight: 63.5 kg 62.4 kg   Physical Examination: General: Overall well-appearing middle-aged woman in NAD. Up to chair at bedside. HEENT: [place]/AT, anicteric sclera, PERRL, moist mucous membranes. Neuro: Awake, oriented x 4. Responds to verbal stimuli. Following commands consistently. Moves all 4 extremities spontaneously. CV: RRR, no m/g/r. PULM: Breathing even and unlabored on RA. Lung fields CTAB. GI: Soft, nontender, nondistended. Normoactive bowel sounds. Extremities: No LE edema noted. Skin: Warm/dry, no rashes.  Resolved Hospital Problem List:     Assessment & Plan:   Stress cardiomyopathy: LVEF 35% by LHC. Non-obstructive CAD despite ST elevation on EKG. No clear inciting event. Fortunately she is hemodynamically stable.  - Management per Cardiology, primary - Home antihypertensives resumed - Continue nitroglycerin as needed  Acute encephalopathy, resolved: Seems to have started after fentanyl administration per family report and worsened after cardiac cath. She was trying to climb out of bed and did pull IVs out. Was treated with haldol and precedex  infusion was initiated prior to my arrival. This is likely medication related, and will need to wean  precedex to perform a more complete neuro exam to assess for focal deficit. Cannot rule out metabolic encephalopathy related to hyponatremia. - Remains off of Precedex - Mental status returned to baseline A&O x4 - No further Haldol required, discontinued - Avoid fentanyl, limit narcotics as able - No indication for further head imaging  Hyponatremia: Sodium 122 on presentation, 12 hours later improved to 123. Likely hypervolemic in nature related to CHF.  - Reduce BMP to Q12H, Na 132 7/21 AM - Further diuresis per cardiology, spironolactone initiated 7/20 - Continue regular diet - IVF NS until sodium within normal limits, may discontinue  Hypercholesterolemia - ASA, statin  GERD Nausea - Zofran, Phenergan as needed -Continue PPI  OSA: on CPAP, but has not been using at home for some time. "Waiting for a new machine" - CPAP nightly while inpatient - Supplemental O2 as needed to maintain SpO2 greater than 90%  Depression - Continue home bupropion, escitalopram, Lamictal  Stable for transfer to floor 7/21.  PCCM will sign off at this time. Thank you for involving [place] in this patient's care. Please do not hesitate to reach out if PCCM can be of further assistance.  Best Practice (right click and "Reselect all SmartList Selections" daily)   Per primary team  Critical care time: N/A   Rhae Lerner [facility] 12/05/21 7:25 AM  Please see [email or web address] for pager details.  From 7A-7P if no response, please call [phone] After hours, please call ELink [phone]

## 2021-12-05 NOTE — Plan of Care (Signed)
  Problem: Education: Goal: Knowledge of General Education information will improve Description: Including pain rating scale, medication(s)/side effects and non-pharmacologic comfort measures Outcome: Progressing   Problem: Health Behavior/Discharge Planning: Goal: Ability to manage health-related needs will improve Outcome: Progressing   Problem: Clinical Measurements: Goal: Ability to maintain clinical measurements within normal limits will improve Outcome: Progressing Goal: Will remain free from infection Outcome: Progressing Goal: Diagnostic test results will improve Outcome: Progressing Goal: Respiratory complications will improve Outcome: Progressing Goal: Cardiovascular complication will be avoided Outcome: Progressing   Problem: Activity: Goal: Risk for activity intolerance will decrease Outcome: Progressing   Problem: Nutrition: Goal: Adequate nutrition will be maintained Outcome: Progressing   Problem: Coping: Goal: Level of anxiety will decrease Outcome: Progressing   Problem: Elimination: Goal: Will not experience complications related to bowel motility Outcome: Progressing Goal: Will not experience complications related to urinary retention Outcome: Progressing   Problem: Pain Managment: Goal: General experience of comfort will improve Outcome: Progressing   Problem: Safety: Goal: Ability to remain free from injury will improve Outcome: Progressing   Problem: Skin Integrity: Goal: Risk for impaired skin integrity will decrease Outcome: Progressing   Problem: Education: Goal: Understanding of cardiac disease, CV risk reduction, and recovery process will improve Outcome: Progressing Goal: Individualized Educational Video(s) Outcome: Progressing   Problem: Activity: Goal: Ability to tolerate increased activity will improve Outcome: Progressing   Problem: Cardiac: Goal: Ability to achieve and maintain adequate cardiovascular perfusion will  improve Outcome: Progressing   Problem: Health Behavior/Discharge Planning: Goal: Ability to safely manage health-related needs after discharge will improve Outcome: Progressing   Problem: Education: Goal: Understanding of CV disease, CV risk reduction, and recovery process will improve Outcome: Progressing Goal: Individualized Educational Video(s) Outcome: Progressing   Problem: Activity: Goal: Ability to return to baseline activity level will improve Outcome: Progressing   Problem: Cardiovascular: Goal: Ability to achieve and maintain adequate cardiovascular perfusion will improve Outcome: Progressing Goal: Vascular access site(s) Level 0-1 will be maintained Outcome: Progressing   Problem: Health Behavior/Discharge Planning: Goal: Ability to safely manage health-related needs after discharge will improve Outcome: Progressing   Problem: Safety: Goal: Non-violent Restraint(s) Outcome: Progressing   Problem: Education: Goal: Knowledge of General Education information will improve Description: Including pain rating scale, medication(s)/side effects and non-pharmacologic comfort measures Outcome: Progressing   Problem: Health Behavior/Discharge Planning: Goal: Ability to manage health-related needs will improve Outcome: Progressing   Problem: Clinical Measurements: Goal: Ability to maintain clinical measurements within normal limits will improve Outcome: Progressing Goal: Will remain free from infection Outcome: Progressing Goal: Diagnostic test results will improve Outcome: Progressing Goal: Respiratory complications will improve Outcome: Progressing Goal: Cardiovascular complication will be avoided Outcome: Progressing   Problem: Activity: Goal: Risk for activity intolerance will decrease Outcome: Progressing   Problem: Nutrition: Goal: Adequate nutrition will be maintained Outcome: Progressing   Problem: Coping: Goal: Level of anxiety will  decrease Outcome: Progressing   Problem: Elimination: Goal: Will not experience complications related to bowel motility Outcome: Progressing Goal: Will not experience complications related to urinary retention Outcome: Progressing   Problem: Pain Managment: Goal: General experience of comfort will improve Outcome: Progressing   Problem: Safety: Goal: Ability to remain free from injury will improve Outcome: Progressing   Problem: Skin Integrity: Goal: Risk for impaired skin integrity will decrease Outcome: Progressing

## 2021-12-05 NOTE — TOC Progression Note (Signed)
Transition of Care [facility]) - Progression Note    Patient Details  Name: Kayla Deleon MRN: [id number] Date of Birth: February 09, 1951  Transition of Care [facility]) CM/SW Contact  Zenon Mayo, RN Phone Number: 12/05/2021, 3:19 PM  Clinical Narrative:    Patient presents with Takotsubo cardiomyopathy  ,Accelerated hypertension, Hyponatremia syndrome, Major depressive disorder, Generalized anxiety disorder,Delirium due to another medical condition.  TOC following.           Expected Discharge Plan and Services                                                 Social Determinants of Health (SDOH) Interventions    Readmission Risk Interventions     No data to display

## 2021-12-06 ENCOUNTER — Telehealth: Payer: Self-pay | Admitting: Physician Assistant

## 2021-12-06 DIAGNOSIS — E871 Hypo-osmolality and hyponatremia: Secondary | ICD-10-CM | POA: Diagnosis not present

## 2021-12-06 DIAGNOSIS — E782 Mixed hyperlipidemia: Secondary | ICD-10-CM

## 2021-12-06 DIAGNOSIS — E785 Hyperlipidemia, unspecified: Secondary | ICD-10-CM

## 2021-12-06 DIAGNOSIS — I5181 Takotsubo syndrome: Secondary | ICD-10-CM | POA: Diagnosis not present

## 2021-12-06 DIAGNOSIS — I10 Essential (primary) hypertension: Secondary | ICD-10-CM | POA: Diagnosis not present

## 2021-12-06 LAB — BASIC METABOLIC PANEL
Anion gap: 7 (ref 5–15)
BUN: 11 mg/dL (ref 8–23)
CO2: 25 mmol/L (ref 22–32)
Calcium: 8.3 mg/dL — ABNORMAL LOW (ref 8.9–10.3)
Chloride: 102 mmol/L (ref 98–111)
Creatinine, Ser: 0.8 mg/dL (ref 0.44–1.00)
GFR, Estimated: >60 mL/min (ref >60)
Glucose, Bld: 111 mg/dL — ABNORMAL HIGH (ref 70–99)
Potassium: 4 mmol/L (ref 3.5–5.1)
Sodium: 134 mmol/L — ABNORMAL LOW (ref 135–145)

## 2021-12-06 MED ORDER — SPIRONOLACTONE 25 MG PO TABS: 25.0000 mg | ORAL_TABLET | Freq: Every day | ORAL | 5 refills | Status: DC

## 2021-12-06 MED ORDER — ISOSORBIDE MONONITRATE ER 30 MG PO TB24: 30.0000 mg | ORAL_TABLET | Freq: Every day | ORAL | 5 refills | Status: DC

## 2021-12-06 MED ORDER — ASPIRIN 81 MG PO TBEC: 81.0000 mg | DELAYED_RELEASE_TABLET | Freq: Every day | ORAL | 11 refills | Status: DC

## 2021-12-06 MED ORDER — LOSARTAN POTASSIUM 50 MG PO TABS: 50.0000 mg | ORAL_TABLET | Freq: Every day | ORAL | 5 refills | Status: DC

## 2021-12-06 MED ORDER — NITROGLYCERIN 0.4 MG SL SUBL: 0.4000 mg | SUBLINGUAL_TABLET | SUBLINGUAL | 3 refills | Status: DC | PRN

## 2021-12-06 MED ORDER — LOSARTAN POTASSIUM 50 MG PO TABS: 50.0000 mg | ORAL_TABLET | Freq: Every day | ORAL | Status: DC

## 2021-12-06 MED ORDER — CARVEDILOL 6.25 MG PO TABS: 6.2500 mg | ORAL_TABLET | Freq: Two times a day (BID) | ORAL | 5 refills | Status: DC

## 2021-12-06 MED ORDER — CARVEDILOL 6.25 MG PO TABS: 6.2500 mg | ORAL_TABLET | Freq: Two times a day (BID) | ORAL | Status: DC

## 2021-12-06 NOTE — Discharge Summary (Cosign Needed Addendum)
Discharge Summary    Patient ID: Kayla Deleon MRN: [id number]; DOB: November 15, 1950  Admit date: 12/02/2021 Discharge date: 12/06/2021  PCP:  Hulan Fess, MD   [facility] Providers Cardiologist:  Pixie Casino, MD        Discharge Diagnoses    Principal Problem:   Takotsubo cardiomyopathy  =>  (Initial Dx was Possible Anterior STEMI - Changed) Active Problems:   Major depressive disorder, recurrent ([place])   Generalized anxiety disorder   Hyponatremia   Delirium due to another medical condition   Accelerated hypertension   Agatston CAC score 100-199   Hyperlipidemia    Diagnostic Studies/Procedures    2D echo 12/03/21   1. The mid-to-apical inferior, mid-to-apical anterior, and mid-to-apical  septal segments are akinetic. There is hypokinesis of the apical lateral  segments. All basal segments demonstrate normal contractility. Pattern  most consistent with takostubo  cardiomyopathy. Left ventricular ejection fraction, by estimation, is 30  to 35%. The left ventricle has moderately decreased function. The left  ventricle demonstrates regional wall motion abnormalities (see scoring  diagram/findings for description). Left   ventricular diastolic parameters are consistent with Grade I diastolic  dysfunction (impaired relaxation).   2. Right ventricular systolic function is normal. The right ventricular  size is normal.   3. The mitral valve is grossly normal. Trivial mitral valve  regurgitation.   4. The aortic valve is tricuspid. There is mild calcification of the  aortic valve. There is mild thickening of the aortic valve. Aortic valve  regurgitation is not visualized. Aortic valve sclerosis/calcification is  present, without any evidence of  aortic stenosis.   5. The inferior vena cava is normal in size with greater than 50%  respiratory variability, suggesting right atrial pressure of 3 mmHg.   Comparison(s): No prior Echocardiogram.   Cath 12/03/21    Mid  LAD lesion is 50% stenosed.   1st Diag lesion is 50% stenosed.   There is severe left ventricular systolic dysfunction.   LV end diastolic pressure is normal.   1.  Moderate nonobstructive stenosis of the mid LAD 2.  Mild nonobstructive plaquing of the left circumflex and right coronary arteries 3.  Severe segmental LV systolic dysfunction with periapical ballooning, hyperkinetic base, LVEF estimated at about 35%, findings consistent with acute Takotsubo syndrome   Recommend: CV-ICU monitoring overnight, beta-blocker and ARB, aspirin 81 mg daily, continued risk reduction measures for nonobstructive CAD.  Check 2D echo. _____________   History of Present Illness     Kayla Deleon is a 71 y.o. female with nonobstructive CAD, HTN, HLD, pre-DM, OSA on CPAP, anxiety who had previously established care with Dr. Debara Pickett for familial hyperlipidemia, intolerant to statin, requiring PCSK9i/Zetia because of presence of elevated coronary calcium score. She recently followed up in the office 10/2021 for abnormal EKG that was obtained prior to that visit at urgent care when she was treated for Covid with Paxlovid and Tessalon perles. Her EKG was said to have shown "inferior infarct, age undetermined, anterolateral infarct, age undetermined." Coronary CTA was pursued showing mnonobstructive CAD (LAD: 1-24% calcified plaque proximally, 25-49% mixed plaque in mid vessel 25-49% soft plaque distally with mis registration artifact; RCA: 1-24% calcified plaque in mid vessel. 1-24% mixed plaque in distal vessel Significant motion artifact present), CAC 129. There was a small hiatal hernia but otherwise no significant aortic pathology.  After her CTA study, she developed progressive nausea and headache (the nausea had been present prior to CT scan but had gradually worsened  during the afternoon). She went to [facility]. She was found to have low sodium of 122 on labs of unclear cause.  She was found to have a left  bundle branch block on her EKG which was new from previous. CXR showed NAD. CT head showed no acute intracranial abnormality, mild chronic microvascular ischemic changes, advanced degenerative changes of the right temporomandibular joint. She was noted to have an elevated high-sensitivity troponin.  The patient was started on heparin and was planned to be admitted to the hospitalist service at [facility].  Her troponin trend had been relatively flat at [phone].  However, the patient developed recurrent chest discomfort the following AM morning associated with nausea and vomiting. A repeat EKG showed marked change from prior with evolving anterolateral changes of ST elevation, T wave inversion, and prolonged QT suggesting an evolving anterior infarct.  A code STEMI was called and the patient was transferred emergently for cardiac catheterization and admission to the cardiology service.  Hospital Course     She underwent urgent cardiac cath showing moderate nonobstructive stenosis of the mid LAD, mild nonobstructive disease in LCx/RCA, and severe segmental LV systolic dysfunction with periapical ballooning, hyperkinetic base, LVEF estimated at about 35%, findings consistent with acute Takotsubo syndrome. 2D Echo showed EF 30-35%, mid-to-apical inferior, mid-to-apical anterior, and mid-to-apical  septal segments akinetic, hypokinesis of apical lateral segment most c/w Takotsubo CM, G1DD, normal RA pressure. Post-cath, she became acutely delirious - confused, agitated, trying to get out of bed and yelling - and was seen by both the hospitalist service and critical care. She had reported a prior bad reaction to Fentanyl and this was suspected to be the case again. Later it was felt that perhaps this was due to antiemetic use +/- contribution from hyponatremia. She also had acute respiratory failure with hypoxia requiring supplemental O2 and hypomagnesemia requiring repletion. She was also treated with Precedex.  Her hyponatremia was ultimately felt due to excessive water intake at home prior to coronary CT angio, followed by nausea and vomiting, with potential contribution of HF. She was started on carvedilol, losartan, and spironolactone. Her mental status improved back to baseline. Her labwork on day of discharge demonstrated sodium of 134, normal renal function, normal CBC. Dr. Acie Fredrickson has seen and examined the patient today and feels she is stable for discharge. She ambulated without difficulty (SBP 140-150s so carvedilol and losartan titrated per Dr. Elmarie Shiley recommendation). She is retired so work note not needed. Advised not to drive until cleared in follow-up given the mental status changes this admission. Medication changes and instructions are outlined below. Recommend obtaining BMET at f/u visit.  ---  Did the patient have an acute coronary syndrome (MI, NSTEMI, STEMI, etc) this admission?:  No.   The elevated Troponin was due to Takotsubo cardiomyopathy.    Given constellation of issues this admission, the patient will be scheduled for a TOC follow up appointment on 12/12/21 with Sande Rives PA-C. (Tried to see if Diona Browner had availability but not in the office that specific 2 weeks.) A message has been sent to the [facility] and Scheduling Pool at the office where the patient should be seen for follow up.  _____________  Discharge Vitals Blood pressure (!) 158/78, pulse 77, temperature 98.2 F (36.8 C), temperature source Oral, resp. rate (!) 21, height '5\' 4"'$  (1.626 m), weight 62.4 kg, SpO2 97 %.  Filed Weights   12/02/21 1823 12/05/21 0600  Weight: 63.5 kg 62.4 kg  Labs & Radiologic Studies    CBC Recent Labs    12/04/21 0615 12/05/21 0644  WBC 14.9* 9.5  HGB 12.4 13.1  HCT 33.5* 38.1  MCV 83.8 88.0  PLT 256 076   Basic Metabolic Panel Recent Labs    12/04/21 0615 12/04/21 1150 12/05/21 0644 12/05/21 1158 12/06/21 0041  NA 123*   < > 132* 132* 134*  K 3.6   < > 3.8  3.7 4.0  CL 85*   < > 97* 98 102  CO2 24   < > '25 22 25  '$ GLUCOSE 132*   < > 114* 207* 111*  BUN 14   < > 7* 9 11  CREATININE 0.92   < > 0.77 0.88 0.80  CALCIUM 8.5*   < > 8.7* 8.5* 8.3*  MG 1.4*  --  2.4  --   --   PHOS 2.5  --   --   --   --    < > = values in this interval not displayed.   Liver Function Tests No results for input(s): "AST", "ALT", "ALKPHOS", "BILITOT", "PROT", "ALBUMIN" in the last 72 hours. No results for input(s): "LIPASE", "AMYLASE" in the last 72 hours. High Sensitivity Troponin:   Recent Labs  Lab 12/02/21 1825 12/02/21 2035 12/03/21 0231 12/03/21 0503  TROPONINIHS 54* 157* 176* 196*    BNP Invalid input(s): "POCBNP" D-Dimer No results for input(s): "DDIMER" in the last 72 hours. Hemoglobin A1C No results for input(s): "HGBA1C" in the last 72 hours. Fasting Lipid Panel No results for input(s): "CHOL", "HDL", "LDLCALC", "TRIG", "CHOLHDL", "LDLDIRECT" in the last 72 hours. Thyroid Function Tests No results for input(s): "TSH", "T4TOTAL", "T3FREE", "THYROIDAB" in the last 72 hours.  Invalid input(s): "FREET3" _____________  ECHOCARDIOGRAM COMPLETE  Result Date: 12/03/2021    ECHOCARDIOGRAM REPORT   Patient Name:   Kayla Deleon Date of Exam: 12/03/2021 Medical Rec #:  [id number]      Height:       64.0 in Accession #:    [id number]     Weight:       140.0 lb Date of Birth:  [birth year]       BSA:          1.681 m Patient Age:    89 years       BP:           152/63 mmHg Patient Gender: F              HR:           65 bpm. Exam Location:  Inpatient Procedure: 2D Echo, Cardiac Doppler, Color Doppler and Intracardiac            Opacification Agent Indications:    Cardiomyopathy-Unspecified I42.9  History:        Patient has no prior history of Echocardiogram examinations.                 Arrythmias:Abnormal EKG; Signs/Symptoms:Chest Pain nausea.  Sonographer:    Darlina Sicilian RDCS Referring Phys: [place]  1. The mid-to-apical inferior,  mid-to-apical anterior, and mid-to-apical septal segments are akinetic. There is hypokinesis of the apical lateral segments. All basal segments demonstrate normal contractility. Pattern most consistent with takostubo cardiomyopathy. Left ventricular ejection fraction, by estimation, is 30 to 35%. The left ventricle has moderately decreased function. The left ventricle demonstrates regional wall motion abnormalities (see scoring diagram/findings for description). Left  ventricular diastolic parameters are consistent with Grade I diastolic  dysfunction (impaired relaxation).  2. Right ventricular systolic function is normal. The right ventricular size is normal.  3. The mitral valve is grossly normal. Trivial mitral valve regurgitation.  4. The aortic valve is tricuspid. There is mild calcification of the aortic valve. There is mild thickening of the aortic valve. Aortic valve regurgitation is not visualized. Aortic valve sclerosis/calcification is present, without any evidence of aortic stenosis.  5. The inferior vena cava is normal in size with greater than 50% respiratory variability, suggesting right atrial pressure of 3 mmHg. Comparison(s): No prior Echocardiogram. FINDINGS  Left Ventricle: The mid-to-apical inferior, mid-to-apical anterior, and mid-to-apical septal segments are akinetic. There is hypokinesis of the apical lateral segments. All basal segments demonstrate normal contractility. Pattern most consistent with takostubo cardiomyopathy. Left ventricular ejection fraction, by estimation, is 30 to 35%. The left ventricle has moderately decreased function. The left ventricle demonstrates regional wall motion abnormalities. Definity contrast agent was given IV to delineate the left ventricular endocardial borders. The left ventricular internal cavity size was normal in size. There is no left ventricular hypertrophy. Left ventricular diastolic parameters are consistent with Grade I diastolic dysfunction  (impaired relaxation). Right Ventricle: The right ventricular size is normal. No increase in right ventricular wall thickness. Right ventricular systolic function is normal. Left Atrium: Left atrial size was normal in size. Right Atrium: Right atrial size was normal in size. Pericardium: There is no evidence of pericardial effusion. Mitral Valve: The mitral valve is grossly normal. Trivial mitral valve regurgitation. Tricuspid Valve: The tricuspid valve is normal in structure. Tricuspid valve regurgitation is trivial. Aortic Valve: The aortic valve is tricuspid. There is mild calcification of the aortic valve. There is mild thickening of the aortic valve. Aortic valve regurgitation is not visualized. Aortic valve sclerosis/calcification is present, without any evidence of aortic stenosis. Pulmonic Valve: The pulmonic valve was not well visualized. Pulmonic valve regurgitation is trivial. Aorta: The aortic root is normal in size and structure. Venous: The inferior vena cava is normal in size with greater than 50% respiratory variability, suggesting right atrial pressure of 3 mmHg. IAS/Shunts: The atrial septum is grossly normal.  LEFT VENTRICLE PLAX 2D LVIDd:         4.95 cm      Diastology LVIDs:         4.20 cm      LV e' medial:    5.91 cm/s LV PW:         0.80 cm      LV E/e' medial:  8.1 LV IVS:        0.65 cm      LV e' lateral:   5.91 cm/s LVOT diam:     2.00 cm      LV E/e' lateral: 8.1 LV SV:         41 LV SV Index:   24 LVOT Area:     3.14 cm  LV Volumes (MOD) LV vol d, MOD A2C: 127.0 ml LV vol d, MOD A4C: 116.0 ml LV vol s, MOD A2C: 82.0 ml LV vol s, MOD A4C: 71.2 ml LV SV MOD A2C:     45.0 ml LV SV MOD A4C:     116.0 ml LV SV MOD BP:      47.8 ml RIGHT VENTRICLE RV S prime:     12.10 cm/s TAPSE (M-mode): 2.1 cm LEFT ATRIUM             Index        RIGHT ATRIUM  Index LA diam:        2.80 cm 1.67 cm/m   RA Area:     9.52 cm LA Vol (A2C):   35.9 ml 21.35 ml/m  RA Volume:   18.20 ml 10.83 ml/m  LA Vol (A4C):   30.9 ml 18.38 ml/m LA Biplane Vol: 35.1 ml 20.88 ml/m  AORTIC VALVE LVOT Vmax:   61.40 cm/s LVOT Vmean:  41.800 cm/s LVOT VTI:    0.130 m  AORTA Ao Root diam: 2.70 cm MITRAL VALVE MV Area (PHT): 2.61 cm    SHUNTS MV Decel Time: 291 msec    Systemic VTI:  0.13 m MV E velocity: 48.00 cm/s  Systemic Diam: 2.00 cm MV A velocity: 74.60 cm/s MV E/A ratio:  0.64 Kayla Kaufman MD Electronically signed by Kayla Kaufman MD Signature Date/Time: 12/03/2021/12:32:23 PM    Final    CARDIAC CATHETERIZATION  Result Date: 12/03/2021   Mid LAD lesion is 50% stenosed.   1st Diag lesion is 50% stenosed.   There is severe left ventricular systolic dysfunction.   LV end diastolic pressure is normal. 1.  Moderate nonobstructive stenosis of the mid LAD 2.  Mild nonobstructive plaquing of the left circumflex and right coronary arteries 3.  Severe segmental LV systolic dysfunction with periapical ballooning, hyperkinetic base, LVEF estimated at about 35%, findings consistent with acute Takotsubo syndrome Recommend: CV-ICU monitoring overnight, beta-blocker and ARB, aspirin 81 mg daily, continued risk reduction measures for nonobstructive CAD.  Check 2D echo.   DG Chest 2 View  Result Date: 12/02/2021 CLINICAL DATA:  Chest pain. EXAM: CHEST - 2 VIEW COMPARISON:  Chest x-ray dated July 26, 2020. FINDINGS: The heart size and mediastinal contours are within normal limits. Both lungs are clear. The visualized skeletal structures are unremarkable. IMPRESSION: No active cardiopulmonary disease. Electronically Signed   By: Titus Dubin M.D.   On: 12/02/2021 19:56   CT Head Wo Contrast  Result Date: 12/02/2021 CLINICAL DATA:  Headache, new or worsening (Age >= 50y) EXAM: CT HEAD WITHOUT CONTRAST TECHNIQUE: Contiguous axial images were obtained from the base of the skull through the vertex without intravenous contrast. RADIATION DOSE REDUCTION: This exam was performed according to the departmental  dose-optimization program which includes automated exposure control, adjustment of the mA and/or kV according to patient size and/or use of iterative reconstruction technique. COMPARISON:  None Available. FINDINGS: Brain: There is no acute intracranial hemorrhage, mass effect, or edema. Gray-white differentiation is preserved. There is no extra-axial fluid collection. Ventricles and sulci are within normal limits in size and configuration. Patchy hypoattenuation in the supratentorial white matter is nonspecific but may reflect mild chronic microvascular ischemic changes. Vascular: There is atherosclerotic calcification at the skull base. Skull: Calvarium is unremarkable. 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 Sinuses/Orbits: No acute finding. Other: None. IMPRESSION: No acute intracranial abnormality. Mild chronic microvascular ischemic changes. 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 Electronically Signed   By: Macy Mis M.D.   On: 12/02/2021 19:56   CT CORONARY MORPH W/CTA COR W/SCORE W/CA W/CM &/OR WO/CM  Addendum Date: 12/02/2021   ADDENDUM REPORT: 12/02/2021 16:47 CLINICAL DATA:  Chest pain EXAM: Cardiac CTA MEDICATIONS: Sub lingual nitro. 4 mg and lopressor '100mg'$  TECHNIQUE: The patient was scanned on a [employer] 192 scanner. Gantry rotation speed was 250 msecs. Collimation was. 6 mm . A 120 kV prospective scan was triggered in the ascending thoracic aorta at 140 HU's with  full mA between 30-70% of the R-R interval . Average HR during the scan was 66 bpm. The 3D data set was interpreted on a dedicated work station using MPR, MIP and VRT modes. A total of 80 cc of contrast was used. FINDINGS: Non-cardiac: See separate report from [facility]. No significant findings on limited lung and soft tissue windows. Calcium score: Calcium noted in RCA and LAD LM 0 LAD 47.9 RCA 81 LCX 0  Total 129 which is 79 th percentile for age/sex Coronary Arteries: Right dominant with no anomalies LM: Normal LAD: 1-24% calcified plaque proximally, 25-49% mixed plaque in mid vessel 25-49% soft plaque distally with mis registration artifact D1: Small vessel normal D2: Small vessel normal Circumflex: Normal OM1: Normal OM2: Normal RCA: 1-24% calcified plaque in mid vessel. 1-24% mixed plaque in distal vessel Significant motion artifact present PDA: Normal PLA: Normal IMPRESSION: 1. Calcium score 129 which is 79 th percentile for age/sex 2.  Normal ascending thoracic aorta 3.2 cm 3.  CAD RADS 2 non obstructive CAD see description above [place] Electronically Signed   By: Jenkins Rouge M.D.   On: 12/02/2021 16:47   Result Date: 12/02/2021 EXAM: OVER-READ INTERPRETATION  CT CHEST The following report is an over-read performed by radiologist Dr. Aletta Edouard of [facility], [place] on 12/02/2021. This over-read does not include interpretation of cardiac or coronary anatomy or pathology. The coronary CTA interpretation by the cardiologist is attached. COMPARISON:  Prior calcium score study on 10/12/2019 FINDINGS: Vascular: No significant noncardiac vascular findings. Mediastinum/Nodes: Visualized mediastinum and hilar regions demonstrate no lymphadenopathy or masses. Small hiatal hernia present. Lungs/Pleura: Visualized lungs show no evidence of pulmonary edema, consolidation, pneumothorax, nodule or pleural fluid. Upper Abdomen: No acute abnormality. Musculoskeletal: No chest wall mass or suspicious bone lesions identified. IMPRESSION: Small hiatal hernia. Electronically Signed: By: Aletta Edouard M.D. On: 12/02/2021 16:20    Disposition   Pt is being discharged home today in good condition.  Follow-up Plans & Appointments     Follow-up Information     [facility] Follow up.   Specialty: Cardiology Why: [place] location - cardiology follow-up visit scheduled on  Friday Dec 12, 2021 at 1:55 PM (Arrive by 1:40 PM). Callie is one of the PAs that works with our cardiology team. Contact information: [place] [phone]               Discharge Instructions     Diet - low sodium heart healthy   Complete by: As directed    Discharge instructions   Complete by: As directed    Please pay close attention to the medicine changes made this admission. - You were started on baby aspirin, carvedilol, isosorbide, spironolactone, and given a prescription for as-needed nitroglycerin.  - Your losartan dose was increased. - We have stopped your melatonin and dextromethorphan-guaifenesin off your medicine list as these medicines can cause mental status changes. Please discuss with your primary care provider before restarting. - Patients taking blood thinners should generally stay away from medicines like ibuprofen, Advil, Motrin, naproxen, and Aleve due to risk of stomach bleeding. You may take Tylenol as directed or talk to your primary doctor about alternatives. - Metoprolol was removed from your list since this was only a one-time dose prior to your recent CT scan.  Your heart tests showed weakness of the heart muscle this admission. This may make you more susceptible to weight gain from fluid  retention, which can lead to symptoms that we call heart failure. Please follow these special instructions:  For patients with congestive heart failure, we give them these special instructions:  1. Watch your fluid intake. In general, you should not be taking in more than 2 liters of fluid per day (close to 64 oz of fluid per day). This includes sources of water in foods like soup, coffee, tea, milk, etc. It's important to stay hydrated but NOT to excess. 2. Weigh yourself on the same scale at same time of day and keep a log. 3. Call your doctor: (Anytime you feel any of the following symptoms)  - 3lb weight gain  overnight or 5lb within a few days - Shortness of breath, with or without a dry hacking cough  - Swelling in the hands, feet or stomach  - If you have to sleep on extra pillows at night in order to breathe   IT IS IMPORTANT TO LET YOUR DOCTOR KNOW EARLY ON IF YOU ARE HAVING SYMPTOMS SO WE CAN HELP YOU!   Increase activity slowly   Complete by: As directed    No driving or returning to work until cleared at your follow-up visit. No lifting over 10 lbs for 2 weeks. No sexual activity for 2 weeks. Keep procedure site clean & dry. If you notice increased pain, swelling, bleeding or pus, call/return!  You may shower, but no soaking baths/hot tubs/pools for 1 week.       Discharge Medications   Allergies as of 12/06/2021       Reactions   Shellfish Allergy Anaphylaxis, Rash   Pt. Uses Epipen. Pt. Uses Epipen.   Fentanyl Other (See Comments)   Confusion, sweating   Percocet [oxycodone-acetaminophen] Other (See Comments)   Confusion, AMS   Statins Other (See Comments)   Severe muscle pain   Flagyl [metronidazole Hcl] Rash        Medication List     STOP taking these medications    ibuprofen 200 MG tablet Commonly known as: ADVIL   melatonin 5 MG Tabs   metoprolol tartrate 100 MG tablet Commonly known as: LOPRESSOR   MUCINEX DM PO       TAKE these medications    acetaminophen 500 MG tablet Commonly known as: TYLENOL Take 1,000 mg by mouth every 6 (six) hours as needed for mild pain.   albuterol 108 (90 Base) MCG/ACT inhaler Commonly known as: VENTOLIN HFA Inhale 2 puffs into the lungs every 6 (six) hours as needed for wheezing or shortness of breath.   aspirin EC 81 MG tablet Take 1 tablet (81 mg total) by mouth daily. Swallow whole. Start taking on: December 07, 2021   augmented betamethasone dipropionate 0.05 % cream Commonly known as: DIPROLENE-AF Apply 1 Application topically 2 (two) times daily as needed for rash.   buPROPion 200 MG 12 hr tablet Commonly  known as: WELLBUTRIN SR Take 200 mg by mouth daily.   carvedilol 6.25 MG tablet Commonly known as: COREG Take 1 tablet (6.25 mg total) by mouth 2 (two) times daily with a meal.   EPINEPHrine 0.3 mg/0.3 mL Soaj injection Commonly known as: EPI-PEN Inject 0.3 mg into the muscle daily as needed for anaphylaxis.   escitalopram 20 MG tablet Commonly known as: LEXAPRO Take 1 tablet (20 mg total) by mouth daily. For depression and anxiety.   ezetimibe 10 MG tablet Commonly known as: ZETIA Take 10 mg by mouth daily.   FISH OIL PO Take 2 capsules by  mouth daily.   isosorbide mononitrate 30 MG 24 hr tablet Commonly known as: IMDUR Take 1 tablet (30 mg total) by mouth daily. Start taking on: December 07, 2021   lamoTRIgine 25 MG tablet Commonly known as: LAMICTAL Take 50 mg by mouth daily.   loratadine 10 MG tablet Commonly known as: CLARITIN Take 10 mg by mouth daily as needed for allergies.   losartan 50 MG tablet Commonly known as: COZAAR Take 1 tablet (50 mg total) by mouth at bedtime. What changed:  medication strength how much to take when to take this   nitroGLYCERIN 0.4 MG SL tablet Commonly known as: NITROSTAT Place 1 tablet (0.4 mg total) under the tongue every 5 (five) minutes as needed for chest pain (up to 3 doses. If taking 3rd dose call 911).   omeprazole 40 MG capsule Commonly known as: PRILOSEC Take 40 mg by mouth daily as needed (acid reflux).   PROBIOTIC PO Take 1 capsule by mouth daily.   Repatha SureClick 741 MG/ML Soaj Generic drug: Evolocumab Inject 1 Dose into the skin every 14 (fourteen) days.   spironolactone 25 MG tablet Commonly known as: ALDACTONE Take 1 tablet (25 mg total) by mouth daily. Start taking on: December 07, 2021           Outstanding Labs/Studies   Recommend BMET in f/u visit  Duration of Discharge Encounter   Greater than 30 minutes including physician time.  Signed, Charlie Pitter, PA-C 12/06/2021, 10:01 AM

## 2021-12-06 NOTE — Telephone Encounter (Addendum)
   Attention TOC pool,  This patient will need a TOC phone call after discharge. They are being discharged today. Follow-up appointment has already been arranged with: 12/12/21 with Sande Rives PA-C They are a patient of Pixie Casino, MD.  Thank you! Charlie Pitter, PA-C

## 2021-12-06 NOTE — Progress Notes (Addendum)
Progress Note  Patient Name: Kayla Deleon Date of Encounter: 12/06/2021  [facility] Cardiologist: Pixie Casino, MD    Subjective   71 year old female who was admitted with chest pain and an EKG suggestive of STEMI.  Coronary CT scan performed several days ago reveals mild to moderate coronary artery disease.  Her ascending aorta was normal.  The patient developed positive cardiac enzymes and recurrent chest pain.  She was referred for urgent heart catheterization.  Heart catheterization on July 19 revealed a mid LAD 50% and a 50% stenosis in the first diagonal stenosis.  She has severe LV dysfunction with periapical ballooning consistent with Takotsubo syndrome. Her hospitalization has been complicated by hyponatremia.  Her sodium levels were as low as 120.  Sodium level this morning is 134.  She has accelerated hypertension.  She feels well . Is ready to go home Will have her ambulate and plan on DC to home if she is stable Will increase coreg and losartan    Inpatient Medications    Scheduled Meds:  aspirin EC  81 mg Oral Daily   buPROPion  200 mg Oral Daily   carvedilol  3.125 mg Oral BID WC   Chlorhexidine Gluconate Cloth  6 each Topical Daily   enoxaparin (LOVENOX) injection  40 mg Subcutaneous Q24H   escitalopram  20 mg Oral Daily   ezetimibe  10 mg Oral Daily   isosorbide mononitrate  30 mg Oral Daily   lamoTRIgine  50 mg Oral Daily   losartan  25 mg Oral QHS   pantoprazole  40 mg Oral Daily   sodium chloride flush  3 mL Intravenous Q12H   spironolactone  25 mg Oral Daily   Continuous Infusions:  sodium chloride Stopped (12/05/21 0622)   PRN Meds: sodium chloride, acetaminophen, nitroGLYCERIN, ondansetron (ZOFRAN) IV, mouth rinse, sodium chloride flush   Vital Signs    Vitals:   12/06/21 0200 12/06/21 0300 12/06/21 0400 12/06/21 0722  BP: 120/61 114/63 112/60   Pulse:      Resp:      Temp:   98.4 F (36.9 C) 98.2 F (36.8 C)  TempSrc:    Oral   SpO2:      Weight:      Height:        Intake/Output Summary (Last 24 hours) at 12/06/2021 0852 Last data filed at 12/06/2021 0700 Gross per 24 hour  Intake 623.7 ml  Output --  Net 623.7 ml      12/05/2021    6:00 AM 12/02/2021    6:23 PM 11/11/2021    8:06 AM  Last 3 Weights  Weight (lbs) 137 lb 9.1 oz 140 lb 140 lb 9.6 oz  Weight (kg) 62.4 kg 63.504 kg 63.776 kg      Telemetry    NSR  - Personally Reviewed  ECG     - Personally Reviewed  Physical Exam   GEN: No acute distress.   Neck: No JVD Cardiac: RRR, no murmurs, rubs, or gallops.  Respiratory: Clear to auscultation bilaterally. GI: Soft, nontender, non-distended  MS: No edema; No deformity. Neuro:  Nonfocal  Psych: Normal affect   Labs    High Sensitivity Troponin:   Recent Labs  Lab 12/02/21 1825 12/02/21 2035 12/03/21 0231 12/03/21 0503  TROPONINIHS 54* 157* 176* 196*     Chemistry Recent Labs  Lab 12/02/21 1825 12/03/21 2694 12/03/21 0839 12/04/21 0615 12/04/21 1150 12/05/21 0644 12/05/21 1158 12/06/21 0041  NA 122* 123*   < >  123*   < > 132* 132* 134*  K 4.7 4.0   < > 3.6   < > 3.8 3.7 4.0  CL 90* 83*   < > 85*   < > 97* 98 102  CO2 20* 22   < > 24   < > '25 22 25  '$ GLUCOSE 142* 185*   < > 132*   < > 114* 207* 111*  BUN 22 14   < > 14   < > 7* 9 11  CREATININE 0.97 0.74   < > 0.92   < > 0.77 0.88 0.80  CALCIUM 9.2 8.8*   < > 8.5*   < > 8.7* 8.5* 8.3*  MG  --   --   --  1.4*  --  2.4  --   --   PROT 7.4 7.2  --   --   --   --   --   --   ALBUMIN 4.3 3.8  --   --   --   --   --   --   AST 21 26  --   --   --   --   --   --   ALT 13 22  --   --   --   --   --   --   ALKPHOS 46 51  --   --   --   --   --   --   BILITOT 0.5 0.9  --   --   --   --   --   --   GFRNONAA >60 >60   < > >60   < > >60 >60 >60  ANIONGAP 12 18*   < > 14   < > '10 12 7   '$ < > = values in this interval not displayed.    Lipids  Recent Labs  Lab 12/03/21 0837  CHOL 142  TRIG 160*  HDL 63  LDLCALC 47   CHOLHDL 2.3    Hematology Recent Labs  Lab 12/03/21 1545 12/04/21 0615 12/05/21 0644  WBC 14.3* 14.9* 9.5  RBC 4.05 4.00 4.33  HGB 12.3 12.4 13.1  HCT 34.4* 33.5* 38.1  MCV 84.9 83.8 88.0  MCH 30.4 31.0 30.3  MCHC 35.8 37.0* 34.4  RDW 12.0 12.1 12.4  PLT 289 256 297   Thyroid No results for input(s): "TSH", "FREET4" in the last 168 hours.  BNPNo results for input(s): "BNP", "PROBNP" in the last 168 hours.  DDimer No results for input(s): "DDIMER" in the last 168 hours.   Radiology    No results found.  Cardiac Studies       Patient Profile     71 y.o. female  admitted with Takotsubo syndrome   Assessment & Plan    Takotsubo syndrome: Patient is feeling quite well.  She is on low-dose carvedilol and losartan.  Echo shows EF of 30 to 35%.  She had apical akinesis/hypokinesis consistent with Takotsubo syndrome.  We will ambulate her in the halls.  We have increased her carvedilol and losartan slightly.   Cont spironolactone   If she does well with ambulation we will anticipate discharge today.  We will have her follow-up with Dr. Debara Pickett or an APP in the next several weeks.  2.  Hyponatremia: Her sodium levels are now stabilized at 134.  3.  Hyperlipidemia: Continue Zetia and Repatha     For questions or updates, please contact [place] Please consult [email or web address] for  contact info under        Signed, Mertie Moores, MD  12/06/2021, 8:52 AM

## 2021-12-07 NOTE — Progress Notes (Signed)
Cardiology Office Note:    Date:  12/12/2021   ID:  Kayla Deleon, DOB 09/04/1950, MRN [id number]  PCP:  Hulan Fess, MD  Cardiologist:  Pixie Casino, MD  Electrophysiologist:  None   Referring MD: Hulan Fess, MD   Chief Complaint: hospital follow-up of stress-induced cardiomyopathy  History of Present Illness:    Kayla Deleon is a 72 y.o. 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  Patient was recently seen in the office by Diona Browner, NP, on 11/11/2021 for further evaluation of an abnormal EKG that was obtained prior to that visit at an Urgent Care when she was treated for COVID. A coronary CTA was ordered for further evaluation and showed only non-obstructive disease. After the CTA on 12/02/2021, she developed progressive nausea and headache (the nausea had been present prior to CT scan but had worsened). Therefore, she presented to the [facility] ED where she was found to have a sodium level of 122 as well as a new LBBB. She was also noted to have an elevated troponin. She was started on IV Heparin with plans to admit to [facility] under the Hospitalist service. However, she then developed recurrent chest pain and repeat EKG showed marked change with evolving anterolateral ST elevations, T wave inversions, and a prolonged QT suggesting an evolving anterior infarct. Code STEMI was called and she was transferred to the cath lab. An emergent cardiac catheterization showed moderate non-obstructive CAD but severe LV dysfunction consistent with Takotsubo syndrome. Echo showed LVEF of 30-35% with akinesis of the mid-to apical inferior, mid-to apical anterior, and mid-to apical septal segments as well as grade 1 diastolic dysfunction. Echo  findings also consistent with Takotsubo syndrome. Post-cath, she became acutely delirious. Both Hospitalist and Critical Care were consulted and it was felt that this was due to a bad reaction to Fentanyl or antiemetic use +/- contribution from hyponatremia. She also had acute hypoxic respiratory failure following cath requiring supplemental O2. The hyponatremia was felt to ultimately be due to excessive water intake prior to CTA followed by nausea and vomiting. Sodium improved to 134 prior to discharge. She was started on GDMT with Losartan, Coreg, and Spironolactone.  Patient presents today for follow-up. Here with husband. Patient doing well overall since discharge. She notes a couple of episodes of lower substernal chest pain that radiated around to the left side of her back. She was having some back pain when she was in the ED. She describes this as very mild pain (just enough for her to notice it is there) and states it has only happened 3 times since she left the hospital. It resolves with Tylenol. No exertional chest pain. She denies any shortness of breath, orthopnea, PND, lower extremity edema, palpitations, lightheadedness, dizziness, or syncope. She is tolerating her medications well.  Past Medical History:  Diagnosis Date   Anxiety    Cervical spondylosis    evaluated in the past by neurossurgeon   Depression    Fibroids    in the past    Hypercholesteremia    Hypertension    Mental disorder    Sleep apnea    on CPAP    Past Surgical History:  Procedure Laterality Date   CESAREAN SECTION     CHOLECYSTECTOMY     LEFT HEART CATH AND CORONARY  ANGIOGRAPHY N/A 12/03/2021   Procedure: LEFT HEART CATH AND CORONARY ANGIOGRAPHY;  Surgeon: Sherren Mocha, MD;  Location: [place] [facility];  Service: Cardiovascular;  Laterality: N/A;    Current Medications: Current Meds  Medication Sig   acetaminophen (TYLENOL) 500 MG tablet Take 1,000 mg by mouth every 6 (six) hours as needed for  mild pain.   aspirin EC 81 MG tablet Take 1 tablet (81 mg total) by mouth daily. Swallow whole.   augmented betamethasone dipropionate (DIPROLENE-AF) 0.05 % cream Apply 1 Application topically 2 (two) times daily as needed for rash.   buPROPion (WELLBUTRIN SR) 200 MG 12 hr tablet Take 200 mg by mouth daily.   carvedilol (COREG) 6.25 MG tablet Take 1 tablet (6.25 mg total) by mouth 2 (two) times daily with a meal.   EPINEPHrine 0.3 mg/0.3 mL IJ SOAJ injection Inject 0.3 mg into the muscle daily as needed for anaphylaxis.   escitalopram (LEXAPRO) 20 MG tablet Take 1 tablet (20 mg total) by mouth daily. For depression and anxiety.   Evolocumab (REPATHA SURECLICK) 732 MG/ML SOAJ Inject 1 Dose into the skin every 14 (fourteen) days.   ezetimibe (ZETIA) 10 MG tablet Take 10 mg by mouth daily.   isosorbide mononitrate (IMDUR) 30 MG 24 hr tablet Take 1 tablet (30 mg total) by mouth daily.   lamoTRIgine (LAMICTAL) 25 MG tablet Take 50 mg by mouth daily.   losartan (COZAAR) 50 MG tablet Take 1 tablet (50 mg total) by mouth at bedtime.   Multiple Vitamin (MULTIVITAMIN) tablet Take 1 tablet by mouth daily.   nitroGLYCERIN (NITROSTAT) 0.4 MG SL tablet Place 1 tablet (0.4 mg total) under the tongue every 5 (five) minutes as needed for chest pain (up to 3 doses. If taking 3rd dose call 911).   Omega-3 Fatty Acids (FISH OIL PO) Take 2 capsules by mouth daily.   omeprazole (PRILOSEC) 40 MG capsule Take 40 mg by mouth daily as needed (acid reflux).   Probiotic Product (PROBIOTIC PO) Take 1 capsule by mouth daily.   spironolactone (ALDACTONE) 25 MG tablet Take 1 tablet (25 mg total) by mouth daily.   [DISCONTINUED] albuterol (VENTOLIN HFA) 108 (90 Base) MCG/ACT inhaler Inhale 2 puffs into the lungs every 6 (six) hours as needed for wheezing or shortness of breath.   [DISCONTINUED] loratadine (CLARITIN) 10 MG tablet Take 10 mg by mouth daily as needed for allergies.     Allergies:   Shellfish allergy, Fentanyl,  Percocet [oxycodone-acetaminophen], Statins, and Flagyl [metronidazole hcl]   Social History   Socioeconomic History   Marital status: Married    Spouse name: Not on file   Number of children: 1   Years of education: Not on file   Highest education level: Bachelor's degree (e.g., BA, AB, BS)  Occupational History   Not on file  Tobacco Use   Smoking status: Former    Packs/day: 0.10    Years: 1.00    Total pack years: 0.10    Types: Cigarettes    Quit date: 05/19/1975    Years since quitting: 46.6   Smokeless tobacco: Never   Tobacco comments:    Smoked in college- occasional/social   Vaping Use   Vaping Use: Never used  Substance and Sexual Activity   Alcohol use: Yes    Alcohol/week: 0.0 standard drinks of alcohol    Comment: social; 4 beers a month max   Drug use: No   Sexual activity: Not on file  Other Topics Concern  Not on file  Social History Narrative   Lives at home with her husband   Right handed   Drinks 3 diet sodas daily   Social Determinants of Health   Financial Resource Strain: Not on file  Food Insecurity: Not on file  Transportation Needs: Not on file  Physical Activity: Not on file  Stress: Not on file  Social Connections: Not on file     Family History: The patient's family history includes Gout in her brother; Heart attack in her father; Hyperlipidemia in her brother and father; Hypertension in her father; Other in her mother. There is no history of Neuropathy.  ROS:   Please see the history of present illness.     EKGs/Labs/Other Studies Reviewed:    The following studies were reviewed today:  Cardiac Catheterization 12/03/2021:   Mid LAD lesion is 50% stenosed.   1st Diag lesion is 50% stenosed.   There is severe left ventricular systolic dysfunction.   LV end diastolic pressure is normal.   1.  Moderate nonobstructive stenosis of the mid LAD 2.  Mild nonobstructive plaquing of the left circumflex and right coronary arteries 3.   Severe segmental LV systolic dysfunction with periapical ballooning, hyperkinetic base, LVEF estimated at about 35%, findings consistent with acute Takotsubo syndrome   Recommend: CV-ICU monitoring overnight, beta-blocker and ARB, aspirin 81 mg daily, continued risk reduction measures for nonobstructive CAD.  Check 2D echo.  Diagnostic Dominance: Right   _______________  Echocardiogram 12/03/2021: Impressions:  1. The mid-to-apical inferior, mid-to-apical anterior, and mid-to-apical  septal segments are akinetic. There is hypokinesis of the apical lateral  segments. All basal segments demonstrate normal contractility. Pattern  most consistent with takostubo  cardiomyopathy. Left ventricular ejection fraction, by estimation, is 30  to 35%. The left ventricle has moderately decreased function. The left  ventricle demonstrates regional wall motion abnormalities (see scoring  diagram/findings for description). Left   ventricular diastolic parameters are consistent with Grade I diastolic  dysfunction (impaired relaxation).   2. Right ventricular systolic function is normal. The right ventricular  size is normal.   3. The mitral valve is grossly normal. Trivial mitral valve  regurgitation.   4. The aortic valve is tricuspid. There is mild calcification of the  aortic valve. There is mild thickening of the aortic valve. Aortic valve  regurgitation is not visualized. Aortic valve sclerosis/calcification is  present, without any evidence of  aortic stenosis.   5. The inferior vena cava is normal in size with greater than 50%  respiratory variability, suggesting right atrial pressure of 3 mmHg.   Comparison(s): No prior Echocardiogram.    EKG:  EKG ordered today. EKG personally reviewed and demonstrates normal sinus rhythm, rate 74 bpm, with Q waves in leads V1-V3 and T wave inversions in leads V2-V5 which were seen on prior EKG. Normal axis. Normal PR and QRS intervals. QTc 424  ms.  Recent Labs: 12/03/2021: ALT 22 12/05/2021: Hemoglobin 13.1; Magnesium 2.4; Platelets 297 12/06/2021: BUN 11; Creatinine, Ser 0.80; Potassium 4.0; Sodium 134  Recent Lipid Panel    Component Value Date/Time   CHOL 142 12/03/2021 0837   TRIG 160 (H) 12/03/2021 0837   HDL 63 12/03/2021 0837   CHOLHDL 2.3 12/03/2021 0837   VLDL 32 12/03/2021 0837   LDLCALC 47 12/03/2021 0837    Physical Exam:    Vital Signs: BP (!) 102/50 (BP Location: Left Arm, Patient Position: Sitting, Cuff Size: Normal)   Pulse 74   Ht '5\' 4"'$  (1.626  m)   Wt 139 lb (63 kg)   BMI 23.86 kg/m     Wt Readings from Last 3 Encounters:  12/12/21 139 lb (63 kg)  12/05/21 137 lb 9.1 oz (62.4 kg)  11/11/21 140 lb 9.6 oz (63.8 kg)     General: 71 y.o. Caucasian female in no acute distress. HEENT: Normocephalic and atraumatic. Sclera clear.  Neck: Supple. No JVD. Heart: RRR. Distinct S1 and S2. No murmurs, gallops, or rubs. Radial pulses 2+ and equal bilaterally. Lungs: No increased work of breathing. Clear to ausculation bilaterally. No wheezes, rhonchi, or rales.  Abdomen: Soft, non-distended, and non-tender to palpation.  Extremities: No lower extremity edema.    Skin: Warm and dry. Neuro: Alert and oriented x3. No focal deficits. Psych: Normal affect. Responds appropriately.  Assessment:    1. Stress-induced cardiomyopathy   2. Coronary artery disease involving native coronary artery of native heart without angina pectoris   3. Primary hypertension   4. Hyperlipidemia, unspecified hyperlipidemia type   5. Prediabetes   6. Obstructive sleep apnea   7. Hyponatremia   8. Pre-op evaluation     Plan:    Stress-Induced Cardiomyopathy Patient recently admitted with stress-induced cardiomyopathy. Echo showed LVEF of 30-35% with akinesis of the mid-to apical inferior, mid-to apical anterior, and mid-to apical septal segments as well as grade 1 diastolic dysfunction.  - Euvolemic on exam. - Continue  Losartan '50mg'$  daily. I don't think her BP will tolerate Entresto. - Continue Coreg 6.'25mg'$  twice daily. - Continue Spironolactone '25mg'$  daily. - Continue Imdur '30mg'$  daily. - Discussed the importance of daily weights and sodium/fluid restrictions. - Will repeat Echo in 2-3 months to reassess LV function.  - Will recheck BMET today.   Non-Obstructive CAD Cardiac catheterization during recent admission showed moderate non-obstructive CAD. - She notes 3 episode of very mild chest pain that radiated around to left back since discharge. No exertional chest pain. Resolved with Tylenol. - Continue Coreg and Imdur as above. - Continue Aspirin, Zetia, and Repatha. - Chest/back pain does not sound like cardiac chest pain. Does not sound like an aortic dissection either. Recent Coronary CTA showed normal ascending thoracic aorta. No additional work-up necessary at this time.  Hypertension BP soft but stable. - Continue medications for ischemic cardiomyopathy as above.  Hyperlipidemia Lipid panel during recent admission: Total Cholesterol 142, Triglycerides 160, HDL 53, LDL 47. - Intolerant to statins. Continue Zetia '10mg'$  daily and Repatha.  Pre-Diabetes Hemoglobin A1c 5.8 during recent admission.  Obstructive Sleep Apnea Patient is not currently using CPAP because she is waiting for a new machine which is supposed to be delivered next week.  Hyponatremia Patient was found to have severe hyponatremia during recent admission with sodium level initially 122. This was ultimately felt to be due to excessive water intake prior to CTA followed by nausea and vomiting. Sodium improved prior to discharge. - Will recheck BMET today.   Pre-Op Evaluation Patient states she needs a colonoscopy. She is stable from a cardiac standpoint with no angina, acute CHF symptoms, palpitations, or syncope. She is able to complete >4.0 METs without any problems. Therefore, based on ACC/AHA guidelines, patient would be at  acceptable risk for the planned procedure without further cardiovascular testing. Regarding Aspirin therapy, we recommend continuation of Aspirin throughout the perioperative period.  However, if the surgeon feels that cessation of Aspirin is required in the perioperative period, it may be stopped 5-7 days prior to surgery with a plan to resume it as soon  as felt to be feasible from a surgical standpoint in the post-operative period. I will route this recommendation to the requesting party via Epic fax function.   Disposition: Follow up in 3 months after Echo.   Medication Adjustments/Labs and Tests Ordered: Current medicines are reviewed at length with the patient today.  Concerns regarding medicines are outlined above.  Orders Placed This Encounter  Procedures   Basic metabolic panel   EKG 31-SHFW   ECHOCARDIOGRAM COMPLETE   No orders of the defined types were placed in this encounter.   Patient Instructions  Medication Instructions:  Your physician recommends that you continue on your current medications as directed. Please refer to the Current Medication list given to you today.   *If you need a refill on your cardiac medications before your next appointment, please call your pharmacy*   Lab Work: Your physician recommends that you complete lab work today. BMET If you have labs (blood work) drawn today and your tests are completely normal, you will receive your results only by: [place] (if you have MyChart) OR A paper copy in the mail If you have any lab test that is abnormal or we need to change your treatment, we will call you to review the results.   Testing/Procedures: Your physician has requested that you have an echocardiogram. Echocardiography is a painless test that uses sound waves to create images of your heart. It provides your doctor with information about the size and shape of your heart and how well your heart's chambers and valves are working. This  procedure takes approximately one hour. There are no restrictions for this procedure.     Follow-Up: At [facility], you and your health needs are our priority.  As part of our continuing mission to provide you with exceptional heart care, we have created designated Provider Care Teams.  These Care Teams include your primary Cardiologist (physician) and Advanced Practice Providers (APPs -  Physician Assistants and Nurse Practitioners) who all work together to provide you with the care you need, when you need it.  We recommend signing up for the patient portal called "MyChart".  Sign up information is provided on this After Visit Summary.  MyChart is used to connect with patients for Virtual Visits (Telemedicine).  Patients are able to view lab/test results, encounter notes, upcoming appointments, etc.  Non-urgent messages can be sent to your provider as well.   To learn more about what you can do with MyChart, go to [email or web address].    Your next appointment:   3 month(s)  The format for your next appointment:   In Person  Provider:   Sande Rives, PA-C        Other Instructions   Important Information About Sugar         Signed, Eppie Gibson  12/12/2021 2:33 PM    [place]

## 2021-12-08 NOTE — Telephone Encounter (Signed)
Patient contacted regarding discharge from [facility] on 12/06/21.  Patient understands to follow up with provider Sande Rives, PA on 07/28 at 1:55 PM at [facility] office. Patient understands discharge instructions? Yes Patient understands medications and regiment? Yes Patient understands to bring all medications to this visit? Yes

## 2021-12-11 ENCOUNTER — Telehealth: Payer: Self-pay

## 2021-12-11 NOTE — Telephone Encounter (Signed)
Spoke with pt. Pt was notified of CTA results and recommendations. Pt voiced understanding.  Pt has a follow up appointment tomorrow 12/12/21.

## 2021-12-12 ENCOUNTER — Encounter: Payer: Self-pay | Admitting: Student

## 2021-12-12 ENCOUNTER — Ambulatory Visit: Payer: PPO | Admitting: Student

## 2021-12-12 VITALS — BP 102/50 | HR 74 | Ht 64.0 in | Wt 139.0 lb

## 2021-12-12 DIAGNOSIS — I10 Essential (primary) hypertension: Secondary | ICD-10-CM

## 2021-12-12 DIAGNOSIS — R7303 Prediabetes: Secondary | ICD-10-CM

## 2021-12-12 DIAGNOSIS — E871 Hypo-osmolality and hyponatremia: Secondary | ICD-10-CM

## 2021-12-12 DIAGNOSIS — I2510 Atherosclerotic heart disease of native coronary artery without angina pectoris: Secondary | ICD-10-CM | POA: Diagnosis not present

## 2021-12-12 DIAGNOSIS — E785 Hyperlipidemia, unspecified: Secondary | ICD-10-CM

## 2021-12-12 DIAGNOSIS — Z01818 Encounter for other preprocedural examination: Secondary | ICD-10-CM | POA: Diagnosis not present

## 2021-12-12 DIAGNOSIS — G4733 Obstructive sleep apnea (adult) (pediatric): Secondary | ICD-10-CM | POA: Diagnosis not present

## 2021-12-12 DIAGNOSIS — I5181 Takotsubo syndrome: Secondary | ICD-10-CM | POA: Diagnosis not present

## 2021-12-12 NOTE — Patient Instructions (Addendum)
Medication Instructions:  Your physician recommends that you continue on your current medications as directed. Please refer to the Current Medication list given to you today.   *If you need a refill on your cardiac medications before your next appointment, please call your pharmacy*   Lab Work: Your physician recommends that you complete lab work today. BMET If you have labs (blood work) drawn today and your tests are completely normal, you will receive your results only by: [place] (if you have MyChart) OR A paper copy in the mail If you have any lab test that is abnormal or we need to change your treatment, we will call you to review the results.   Testing/Procedures: Your physician has requested that you have an echocardiogram. Echocardiography is a painless test that uses sound waves to create images of your heart. It provides your doctor with information about the size and shape of your heart and how well your heart's chambers and valves are working. This procedure takes approximately one hour. There are no restrictions for this procedure.     Follow-Up: At [facility], you and your health needs are our priority.  As part of our continuing mission to provide you with exceptional heart care, we have created designated Provider Care Teams.  These Care Teams include your primary Cardiologist (physician) and Advanced Practice Providers (APPs -  Physician Assistants and Nurse Practitioners) who all work together to provide you with the care you need, when you need it.  We recommend signing up for the patient portal called "MyChart".  Sign up information is provided on this After Visit Summary.  MyChart is used to connect with patients for Virtual Visits (Telemedicine).  Patients are able to view lab/test results, encounter notes, upcoming appointments, etc.  Non-urgent messages can be sent to your provider as well.   To learn more about what you can do with MyChart, go to  [email or web address].    Your next appointment:   3 month(s)  The format for your next appointment:   In Person  Provider:   Sande Rives, PA-C        Other Instructions   Important Information About Sugar

## 2021-12-13 ENCOUNTER — Emergency Department (HOSPITAL_BASED_OUTPATIENT_CLINIC_OR_DEPARTMENT_OTHER)
Admission: EM | Admit: 2021-12-13 | Discharge: 2021-12-13 | Disposition: A | Discharge status: Home / Self Care | Payer: PPO | Attending: Emergency Medicine | Admitting: Emergency Medicine

## 2021-12-13 ENCOUNTER — Encounter (HOSPITAL_BASED_OUTPATIENT_CLINIC_OR_DEPARTMENT_OTHER): Payer: Self-pay | Admitting: Emergency Medicine

## 2021-12-13 ENCOUNTER — Other Ambulatory Visit: Payer: Self-pay

## 2021-12-13 ENCOUNTER — Emergency Department (HOSPITAL_BASED_OUTPATIENT_CLINIC_OR_DEPARTMENT_OTHER): Payer: PPO

## 2021-12-13 DIAGNOSIS — R778 Other specified abnormalities of plasma proteins: Secondary | ICD-10-CM | POA: Diagnosis not present

## 2021-12-13 DIAGNOSIS — Z7982 Long term (current) use of aspirin: Secondary | ICD-10-CM | POA: Insufficient documentation

## 2021-12-13 DIAGNOSIS — Z79899 Other long term (current) drug therapy: Secondary | ICD-10-CM | POA: Insufficient documentation

## 2021-12-13 DIAGNOSIS — I10 Essential (primary) hypertension: Secondary | ICD-10-CM | POA: Diagnosis not present

## 2021-12-13 DIAGNOSIS — R110 Nausea: Secondary | ICD-10-CM | POA: Insufficient documentation

## 2021-12-13 DIAGNOSIS — R0789 Other chest pain: Secondary | ICD-10-CM | POA: Diagnosis not present

## 2021-12-13 DIAGNOSIS — M549 Dorsalgia, unspecified: Secondary | ICD-10-CM | POA: Diagnosis not present

## 2021-12-13 DIAGNOSIS — R079 Chest pain, unspecified: Secondary | ICD-10-CM | POA: Diagnosis not present

## 2021-12-13 DIAGNOSIS — R739 Hyperglycemia, unspecified: Secondary | ICD-10-CM | POA: Insufficient documentation

## 2021-12-13 LAB — CBC
HCT: 35.5 % — ABNORMAL LOW (ref 36.0–46.0)
Hemoglobin: 12.1 g/dL (ref 12.0–15.0)
MCH: 31.3 pg (ref 26.0–34.0)
MCHC: 34.1 g/dL (ref 30.0–36.0)
MCV: 91.7 fL (ref 80.0–100.0)
Platelets: 287 10*3/uL (ref 150–400)
RBC: 3.87 MIL/uL (ref 3.87–5.11)
RDW: 12.8 % (ref 11.5–15.5)
WBC: 8.1 10*3/uL (ref 4.0–10.5)
nRBC: 0 % (ref 0.0–0.2)

## 2021-12-13 LAB — BASIC METABOLIC PANEL
Anion gap: 8 (ref 5–15)
BUN/Creatinine Ratio: 18 (ref 12–28)
BUN: 17 mg/dL (ref 8–27)
BUN: 17 mg/dL (ref 8–23)
CO2: 19 mmol/L — ABNORMAL LOW (ref 20–29)
CO2: 26 mmol/L (ref 22–32)
Calcium: 9.8 mg/dL (ref 8.7–10.3)
Calcium: 9.3 mg/dL (ref 8.9–10.3)
Chloride: 101 mmol/L (ref 96–106)
Chloride: 101 mmol/L (ref 98–111)
Creatinine, Ser: 0.95 mg/dL (ref 0.57–1.00)
Creatinine, Ser: 0.92 mg/dL (ref 0.44–1.00)
GFR, Estimated: >60 mL/min (ref >60)
Glucose, Bld: 126 mg/dL — ABNORMAL HIGH (ref 70–99)
Glucose: 89 mg/dL (ref 70–99)
Potassium: 5.4 mmol/L — ABNORMAL HIGH (ref 3.5–5.2)
Potassium: 4 mmol/L (ref 3.5–5.1)
Sodium: 140 mmol/L (ref 134–144)
Sodium: 135 mmol/L (ref 135–145)
eGFR: 65 mL/min/{1.73_m2} (ref >59)

## 2021-12-13 LAB — TROPONIN I (HIGH SENSITIVITY)
Troponin I (High Sensitivity): 24 ng/L — ABNORMAL HIGH (ref <18)
Troponin I (High Sensitivity): 22 ng/L — ABNORMAL HIGH (ref <18)

## 2021-12-13 MED ORDER — NITROGLYCERIN 0.4 MG SL SUBL: 0.4000 mg | SUBLINGUAL_TABLET | SUBLINGUAL | Status: DC | PRN

## 2021-12-13 MED ORDER — ISOSORBIDE MONONITRATE ER 60 MG PO TB24: 60.0000 mg | ORAL_TABLET | Freq: Every day | ORAL | 0 refills | Status: DC

## 2021-12-13 NOTE — ED Triage Notes (Signed)
  Patient comes in with mid back pain and nausea that started at midnight.  Patient was admitted last week for chest pain and received catheterization.  Patient takes she took 3 nitro glycerin SL that helped with pain.  Patient endorsing nausea with no vomiting.  Pain 3/10, tightness/pressure in back.

## 2021-12-13 NOTE — ED Provider Notes (Signed)
[facility] EMERGENCY DEPT Provider Note   CSN: [id number] Arrival date & time: 12/13/21  0053     History  Chief Complaint  Patient presents with   Nausea   Back Pain    Kayla Deleon is a 71 y.o. female.  The history is provided by the patient and the spouse.  Chest Pain Pain location:  Substernal area Pain quality comment:  Tightness Associated symptoms: back pain and nausea   Associated symptoms: no fever, no syncope, no vomiting and no weakness   Patient reports since around midnight she has been having chest tightness and back tightness.  No significant shortness of breath.  She has had nausea but no vomiting.  She took 3 nitroglycerin which took her pain down to about a 2/10.  No fevers or vomiting.  No focal weakness.  This is similar to prior episodes of chest and back pain.  She was recently in the hospital and diagnosed with stress-induced cardiomyopathy    Past Medical History:  Diagnosis Date   Anxiety    Cervical spondylosis    evaluated in the past by neurossurgeon   Depression    Fibroids    in the past    Hypercholesteremia    Hypertension    Mental disorder    Sleep apnea    on CPAP    Home Medications Prior to Admission medications   Medication Sig Start Date End Date Taking? Authorizing Provider  isosorbide mononitrate (IMDUR) 60 MG 24 hr tablet Take 1 tablet (60 mg total) by mouth daily. 12/13/21  Yes Ripley Fraise, MD  acetaminophen (TYLENOL) 500 MG tablet Take 1,000 mg by mouth every 6 (six) hours as needed for mild pain.    [provider]  aspirin EC 81 MG tablet Take 1 tablet (81 mg total) by mouth daily. Swallow whole. 12/07/21   Nahser, Wonda Cheng, MD  augmented betamethasone dipropionate (DIPROLENE-AF) 0.05 % cream Apply 1 Application topically 2 (two) times daily as needed for rash. 11/17/21   [provider]  buPROPion (WELLBUTRIN SR) 200 MG 12 hr tablet Take 200 mg by mouth daily. 10/19/15   [provider]  carvedilol (COREG) 6.25 MG tablet Take 1 tablet (6.25 mg total) by mouth 2 (two) times daily with a meal. 12/06/21   Nahser, Wonda Cheng, MD  EPINEPHrine 0.3 mg/0.3 mL IJ SOAJ injection Inject 0.3 mg into the muscle daily as needed for anaphylaxis.    [provider]  escitalopram (LEXAPRO) 20 MG tablet Take 1 tablet (20 mg total) by mouth daily. For depression and anxiety. 08/28/11   Greig Castilla, FNP  Evolocumab (REPATHA SURECLICK) 009 MG/ML SOAJ Inject 1 Dose into the skin every 14 (fourteen) days. 04/04/21   Hilty, Nadean Corwin, MD  ezetimibe (ZETIA) 10 MG tablet Take 10 mg by mouth daily. 03/18/21   [provider]  lamoTRIgine (LAMICTAL) 25 MG tablet Take 50 mg by mouth daily.    [provider]  losartan (COZAAR) 50 MG tablet Take 1 tablet (50 mg total) by mouth at bedtime. 12/06/21   Nahser, Wonda Cheng, MD  Multiple Vitamin (MULTIVITAMIN) tablet Take 1 tablet by mouth daily.    [provider]  nitroGLYCERIN (NITROSTAT) 0.4 MG SL tablet Place 1 tablet (0.4 mg total) under the tongue every 5 (five) minutes as needed for chest pain (up to 3 doses. If taking 3rd dose call 911). 12/06/21   Dunn, Nedra Hai, PA-C  Omega-3 Fatty Acids (FISH OIL PO) Take  2 capsules by mouth daily.    [provider]  omeprazole (PRILOSEC) 40 MG capsule Take 40 mg by mouth daily as needed (acid reflux).    [provider]  Probiotic Product (PROBIOTIC PO) Take 1 capsule by mouth daily.    [provider]  spironolactone (ALDACTONE) 25 MG tablet Take 1 tablet (25 mg total) by mouth daily. 12/07/21   Nahser, Wonda Cheng, MD      Allergies    Shellfish allergy, Fentanyl, Percocet [oxycodone-acetaminophen], Statins, and Flagyl [metronidazole hcl]    Review of Systems   Review of Systems  Constitutional:  Negative for fever.  Cardiovascular:  Positive for chest pain. Negative for syncope.  Gastrointestinal:  Positive for nausea. Negative for vomiting.   Musculoskeletal:  Positive for back pain.  Neurological:  Negative for weakness.    Physical Exam Updated Vital Signs BP 132/66   Pulse 68   Temp 98.1 F (36.7 C) (Oral)   Resp 19   Ht 1.626 m ('5\' 4"'$ )   Wt 63 kg   SpO2 98%   BMI 23.86 kg/m  Physical Exam CONSTITUTIONAL: Elderly, no acute distress HEAD: Normocephalic/atraumatic EYES: EOMI/PERRL ENMT: Mucous membranes moist NECK: supple no meningeal signs SPINE/BACK:entire spine nontender, no reproducible pain CV: S1/S2 noted, no murmurs/rubs/gallops noted LUNGS: Lungs are clear to auscultation bilaterally, no apparent distress Chest-no reproducible pain ABDOMEN: soft, nontender NEURO: Pt is awake/alert/appropriate, moves all extremitiesx4.  No facial droop.   EXTREMITIES: pulses normal/equal, full ROM, no lower extremity edema or tenderness SKIN: warm, color normal PSYCH: no abnormalities of mood noted, alert and oriented to situation  ED Results / Procedures / Treatments   Labs (all labs ordered are listed, but only abnormal results are displayed) Labs Reviewed  BASIC METABOLIC PANEL - Abnormal; Notable for the following components:      Result Value   Glucose, Bld 126 (*)    All other components within normal limits  CBC - Abnormal; Notable for the following components:   HCT 35.5 (*)    All other components within normal limits  TROPONIN I (HIGH SENSITIVITY) - Abnormal; Notable for the following components:   Troponin I (High Sensitivity) 24 (*)    All other components within normal limits  TROPONIN I (HIGH SENSITIVITY) - Abnormal; Notable for the following components:   Troponin I (High Sensitivity) 22 (*)    All other components within normal limits    EKG EKG Interpretation  Date/Time:  Saturday December 13 2021 01:29:07 EDT Ventricular Rate:  66 PR Interval:  152 QRS Duration: 129 QT Interval:  417 QTC Calculation: 437 R Axis:   61 Text Interpretation: Sinus rhythm Nonspecific intraventricular  conduction delay Probable anteroseptal infarct, recent biphasic T waves noted improved from  prior Confirmed by Ripley Fraise [phone]) on 12/13/2021 1:33:42 AM  Radiology DG Chest Port 1 View  Result Date: 12/13/2021 CLINICAL DATA:  Back pain for 2 hours, initial encounter EXAM: PORTABLE CHEST 1 VIEW COMPARISON:  12/02/2021 FINDINGS: Cardiac shadow is within normal limits. Lungs are well aerated bilaterally. No focal infiltrate or effusion is seen. No bony abnormality is noted. IMPRESSION: No active disease. Electronically Signed   By: Inez Catalina M.D.   On: 12/13/2021 02:23    Procedures Procedures    Medications Ordered in ED Medications  nitroGLYCERIN (NITROSTAT) SL tablet 0.4 mg (has no administration in time range)    ED Course/ Medical Decision Making/ A&P Clinical Course as of 12/13/21 0509  Sat Dec 13, 2021  0213  Patient with recent cardiac cath this month that revealed likely Takotsubo cardiomyopathy.  LAD was approximately 50% stenosed.  Recent CT chest revealed a normal ascending aorta.  Patient reports medication compliance.  She does report this pain is similar to the pain that caused her recent admission [DW]  0216 Troponin I (High Sensitivity)(!): 24 Mildly elevated troponin [DW]  0233 Glucose(!): 126 Mild hyerglycemia [DW]  0402 Pt improved. She is pain free.  Will defer NTG [DW]  0402 D/w dr Wilmon Pali with cardiology.  He has reviewed chart.  He has recommended increasing coreg or IMDUR for better control of pain.  Pt is agreeable with this plan [DW]    Clinical Course User Index [DW] Ripley Fraise, MD                           Medical Decision Making Amount and/or Complexity of Data Reviewed Labs: ordered. Decision-making details documented in ED Course. Radiology: ordered.  Risk Prescription drug management.   This patient presents to the ED for concern of chest pain, this involves an extensive number of treatment options, and is a complaint that carries  with it a high risk of complications and morbidity.  The differential diagnosis includes but is not limited to acute coronary syndrome, aortic dissection, pulmonary embolism, pericarditis, pneumothorax, pneumonia, myocarditis, pleurisy, esophageal rupture    Comorbidities that complicate the patient evaluation: Patient's presentation is complicated by their history of hypertension and CAD  Additional history obtained: Additional history obtained from spouse Records reviewed previous admission documents patient with recent admission found to have stress-induced cardiomyopathy  Lab Tests: I Ordered, and personally interpreted labs.  The pertinent results include: Mildly elevated troponin  Imaging Studies ordered: I ordered imaging studies including X-ray chest   I independently visualized and interpreted imaging which showed no acute finding I agree with the radiologist interpretation  Cardiac Monitoring: The patient was maintained on a cardiac monitor.  I personally viewed and interpreted the cardiac monitor which showed an underlying rhythm of:  sinus rhythm  Consultations Obtained: I requested consultation with the consultant cardiology , and discussed  findings as well as pertinent plan - they recommend: Medication adjusted  Reevaluation: After the interventions noted above, I reevaluated the patient and found that they have :improved  Complexity of problems addressed: Patient's presentation is most consistent with  acute presentation with potential threat to life or bodily function  Disposition: After consideration of the diagnostic results and the patient's response to treatment,  I feel that the patent would benefit from discharge   .   Patient well-appearing.  She is back to baseline.  This may be related to elevated blood pressure at home as her BP is improved now We will only increase Imdur to 60 mg daily.  She will call her cardiologist next week. Since patient is back to  baseline, she will be discharged home        Final Clinical Impression(s) / ED Diagnoses Final diagnoses:  Chest tightness    Rx / DC Orders ED Discharge Orders          Ordered    isosorbide mononitrate (IMDUR) 60 MG 24 hr tablet  Daily        12/13/21 0508              Ripley Fraise, MD 12/13/21 [phone]

## 2021-12-13 NOTE — Discharge Instructions (Signed)

## 2021-12-13 NOTE — ED Notes (Signed)
Patient states chest pain has resolved and is a 0/10.

## 2021-12-13 NOTE — ED Notes (Signed)
Patient ambulated to restroom.

## 2021-12-16 ENCOUNTER — Ambulatory Visit: Payer: PPO | Admitting: Physician Assistant

## 2021-12-22 DIAGNOSIS — G4733 Obstructive sleep apnea (adult) (pediatric): Secondary | ICD-10-CM | POA: Diagnosis not present

## 2021-12-25 ENCOUNTER — Ambulatory Visit
Admission: RE | Admit: 2021-12-25 | Discharge: 2021-12-25 | Disposition: A | Discharge status: Home / Self Care | Payer: PPO | Source: Ambulatory Visit | Attending: Family Medicine | Admitting: Family Medicine

## 2021-12-25 ENCOUNTER — Other Ambulatory Visit: Payer: Self-pay | Admitting: Family Medicine

## 2021-12-25 DIAGNOSIS — Z1231 Encounter for screening mammogram for malignant neoplasm of breast: Secondary | ICD-10-CM

## 2021-12-31 MED ORDER — ISOSORBIDE MONONITRATE ER 60 MG PO TB24: 60.0000 mg | ORAL_TABLET | Freq: Every day | ORAL | 4 refills | Status: DC

## 2022-01-22 DIAGNOSIS — G4733 Obstructive sleep apnea (adult) (pediatric): Secondary | ICD-10-CM | POA: Diagnosis not present

## 2022-01-29 ENCOUNTER — Ambulatory Visit (HOSPITAL_COMMUNITY): Payer: PPO | Attending: Student

## 2022-01-29 DIAGNOSIS — I5181 Takotsubo syndrome: Secondary | ICD-10-CM | POA: Diagnosis not present

## 2022-01-29 LAB — ECHOCARDIOGRAM COMPLETE
Area-P 1/2: 2.49 cm2
S' Lateral: 2.7 cm

## 2022-02-05 ENCOUNTER — Ambulatory Visit: Payer: PPO | Admitting: Nurse Practitioner

## 2022-02-16 ENCOUNTER — Ambulatory Visit: Payer: PPO | Attending: Student | Admitting: Nurse Practitioner

## 2022-02-16 ENCOUNTER — Encounter: Payer: Self-pay | Admitting: Nurse Practitioner

## 2022-02-16 VITALS — BP 110/60 | HR 76 | Ht 64.0 in | Wt 144.4 lb

## 2022-02-16 DIAGNOSIS — R0989 Other specified symptoms and signs involving the circulatory and respiratory systems: Secondary | ICD-10-CM

## 2022-02-16 DIAGNOSIS — G4733 Obstructive sleep apnea (adult) (pediatric): Secondary | ICD-10-CM

## 2022-02-16 DIAGNOSIS — R7303 Prediabetes: Secondary | ICD-10-CM | POA: Diagnosis not present

## 2022-02-16 DIAGNOSIS — E871 Hypo-osmolality and hyponatremia: Secondary | ICD-10-CM

## 2022-02-16 DIAGNOSIS — I5181 Takotsubo syndrome: Secondary | ICD-10-CM | POA: Diagnosis not present

## 2022-02-16 DIAGNOSIS — E785 Hyperlipidemia, unspecified: Secondary | ICD-10-CM | POA: Diagnosis not present

## 2022-02-16 DIAGNOSIS — I10 Essential (primary) hypertension: Secondary | ICD-10-CM | POA: Diagnosis not present

## 2022-02-16 DIAGNOSIS — I2510 Atherosclerotic heart disease of native coronary artery without angina pectoris: Secondary | ICD-10-CM

## 2022-02-16 LAB — BASIC METABOLIC PANEL
BUN/Creatinine Ratio: 20 (ref 12–28)
BUN: 20 mg/dL (ref 8–27)
CO2: 26 mmol/L (ref 20–29)
Calcium: 9.8 mg/dL (ref 8.7–10.3)
Chloride: 102 mmol/L (ref 96–106)
Creatinine, Ser: 1 mg/dL (ref 0.57–1.00)
Glucose: 109 mg/dL — ABNORMAL HIGH (ref 70–99)
Potassium: 4.3 mmol/L (ref 3.5–5.2)
Sodium: 141 mmol/L (ref 134–144)
eGFR: 61 mL/min/{1.73_m2} (ref >59)

## 2022-02-16 NOTE — Patient Instructions (Addendum)
Medication Instructions:  Your physician recommends that you continue on your current medications as directed. Please refer to the Current Medication list given to you today.   *If you need a refill on your cardiac medications before your next appointment, please call your pharmacy*   Lab Work: Your physician recommends that you complete labs today BMET If you have labs (blood work) drawn today and your tests are completely normal, you will receive your results only by: MyChart Message (if you have MyChart) OR A paper copy in the mail If you have any lab test that is abnormal or we need to change your treatment, we will call you to review the results.   Testing/Procedures: 1.Your physician has requested that you have an echocardiogram. Echocardiography is a painless test that uses sound waves to create images of your heart. It provides your doctor with information about the size and shape of your heart and how well your heart's chambers and valves are working. This procedure takes approximately one hour. There are no restrictions for this procedure.   2.Carotid Dopplers   Follow-Up: At [facility], you and your health needs are our priority.  As part of our continuing mission to provide you with exceptional heart care, we have created designated Provider Care Teams.  These Care Teams include your primary Cardiologist (physician) and Advanced Practice Providers (APPs -  Physician Assistants and Nurse Practitioners) who all work together to provide you with the care you need, when you need it.  We recommend signing up for the patient portal called "MyChart".  Sign up information is provided on this After Visit Summary.  MyChart is used to connect with patients for Virtual Visits (Telemedicine).  Patients are able to view lab/test results, encounter notes, upcoming appointments, etc.  Non-urgent messages can be sent to your provider as well.   To learn more about what you can do with  MyChart, go to [email or web address].    Your next appointment:   3-4 month(s)  The format for your next appointment:   In Person  Provider:   Diona Browner, NP        Other Instructions   Important Information About [place]

## 2022-02-16 NOTE — Progress Notes (Signed)
Office Visit    Patient Name: Kayla Deleon Date of Encounter: 02/16/2022  Primary Care Provider:  Hulan Fess, MD Primary Cardiologist:  Pixie Casino, MD  Chief Complaint    71 year old female with a history of nonobstructive CAD, stress-induced cardiomyopathy, hypertension, hyperlipidemia, prediabetes, and OSA who presents for follow-up related to cardiomyopathy and hypertension.  Past Medical History    Past Medical History:  Diagnosis Date   Anxiety    Cervical spondylosis    evaluated in the past by neurossurgeon   Depression    Fibroids    in the past    Hypercholesteremia    Hypertension    Mental disorder    Sleep apnea    on CPAP   Past Surgical History:  Procedure Laterality Date   CESAREAN SECTION     CHOLECYSTECTOMY     LEFT HEART CATH AND CORONARY ANGIOGRAPHY N/A 12/03/2021   Procedure: LEFT HEART CATH AND CORONARY ANGIOGRAPHY;  Surgeon: Sherren Mocha, MD;  Location: [facility];  Service: Cardiovascular;  Laterality: N/A;    Allergies  Allergies  Allergen Reactions   Shellfish Allergy Anaphylaxis and Rash    Pt. Uses Epipen. Pt. Uses Epipen.    Fentanyl Other (See Comments)    Confusion, sweating   Percocet [Oxycodone-Acetaminophen] Other (See Comments)    Confusion, AMS   Statins Other (See Comments)    Severe muscle pain   Flagyl [Metronidazole Hcl] Rash    History of Present Illness    71 year old female with the above past medical history including nonobstructive CAD, stress-induced cardiomyopathy, hypertension, hyperlipidemia, prediabetes, and OSA.  She has followed with Dr. Debara Pickett for a history of familial hyperlipidemia.  She was seen in the office in June 2023 for further evaluation of an abnormal EKG that was obtained prior to visit at our urgent care at which time she was treated for COVID-19.  Coronary CTA showed nonobstructive CAD.  She was later hospitalized in July 2023 in the setting of chest pain, STEMI. Emergent  cardiac catheterization showed moderate nonobstructive CAD but severe LV dysfunction consistent with Takotsubo cardiomyopathy.  Echocardiogram showed EF 30 to 35% with akinesis of the mid to apical inferior, mid apical anterior, and mid to apical septal segments as well as G1 DD, distant with stress-induced cardiomyopathy.  Post cath she became acutely delirious.   She had acute hypoxic respiratory failure following and required supplemental O2. Ultimately her symptoms were felt to be due to a negative reaction to fentanyl and/or antiemetic use, possible hyponatremia in the setting of excessive water intake and nausea, vomiting.  She was started on losartan, carvedilol, and spironolactone.  She was last seen in the office on 12/12/2021 and was stable from a cardiac standpoint.  He did note some intermittent and that radiated to her back, thought to be noncardiac.  She was cleared for a colonoscopy.  Repeat echocardiogram was recommended in 2-3 months. She returned to the ED on 12/13/2021 with back pain and nausea.  This was felt to be related to elevated BP.  Her Imdur was increased to 60 mg daily.  She presents today for follow-up.  Since her last visit she has done well from a cardiac standpoint.  She denies any symptoms concerning for angina.  BP has been well controlled. She notes an ongoing intermittent cough-she thinks this is residual from her COVID-19 infection in June 2023.  She denies any dyspnea, PND, orthopnea, weight gain. She is exercising regularly and tolerating this well.  Overall, she reports feeling well denies any additional concerns today.  Home Medications    Current Outpatient Medications  Medication Sig Dispense Refill   aspirin EC 81 MG tablet Take 1 tablet (81 mg total) by mouth daily. Swallow whole. 30 tablet 11   augmented betamethasone dipropionate (DIPROLENE-AF) 0.05 % cream Apply 1 Application topically 2 (two) times daily as needed for rash.     buPROPion (WELLBUTRIN SR) 200  MG 12 hr tablet Take 200 mg by mouth daily.     carvedilol (COREG) 6.25 MG tablet Take 1 tablet (6.25 mg total) by mouth 2 (two) times daily with a meal. 60 tablet 5   EPINEPHrine 0.3 mg/0.3 mL IJ SOAJ injection Inject 0.3 mg into the muscle daily as needed for anaphylaxis.     escitalopram (LEXAPRO) 20 MG tablet Take 1 tablet (20 mg total) by mouth daily. For depression and anxiety. 30 tablet 0   Evolocumab (REPATHA SURECLICK) 017 MG/ML SOAJ Inject 1 Dose into the skin every 14 (fourteen) days. 2 mL 11   ezetimibe (ZETIA) 10 MG tablet Take 10 mg by mouth daily.     isosorbide mononitrate (IMDUR) 60 MG 24 hr tablet Take 1 tablet (60 mg total) by mouth daily. 30 tablet 4   lamoTRIgine (LAMICTAL) 25 MG tablet Take 50 mg by mouth daily.     losartan (COZAAR) 50 MG tablet Take 1 tablet (50 mg total) by mouth at bedtime. 30 tablet 5   Multiple Vitamin (MULTIVITAMIN) tablet Take 1 tablet by mouth daily.     nitroGLYCERIN (NITROSTAT) 0.4 MG SL tablet Place 1 tablet (0.4 mg total) under the tongue every 5 (five) minutes as needed for chest pain (up to 3 doses. If taking 3rd dose call 911). 25 tablet 3   Omega-3 Fatty Acids (FISH OIL PO) Take 2 capsules by mouth daily.     omeprazole (PRILOSEC) 40 MG capsule Take 40 mg by mouth daily as needed (acid reflux).     Probiotic Product (PROBIOTIC PO) Take 1 capsule by mouth daily.     spironolactone (ALDACTONE) 25 MG tablet Take 1 tablet (25 mg total) by mouth daily. 30 tablet 5   acetaminophen (TYLENOL) 500 MG tablet Take 1,000 mg by mouth every 6 (six) hours as needed for mild pain.     No current facility-administered medications for this visit.     Review of Systems    She denies chest pain, palpitations, dyspnea, pnd, orthopnea, n, v, dizziness, syncope, edema, weight gain, or early satiety. All other systems reviewed and are otherwise negative except as noted above.   Physical Exam    VS:  BP 110/60   Pulse 76   Ht '5\' 4"'$  (1.626 m)   Wt 144 lb 6.4  oz (65.5 kg)   SpO2 96%   BMI 24.79 kg/m  GEN: Well nourished, well developed, in no acute distress. HEENT: normal. Neck: Supple, no JVD, L carotid bruit, no masses. Cardiac: RRR, no murmurs, rubs, or gallops. No clubbing, cyanosis, edema.  Radials/DP/PT 2+ and equal bilaterally.  Respiratory:  Respirations regular and unlabored, clear to auscultation bilaterally. GI: Soft, nontender, nondistended, BS + x 4. MS: no deformity or atrophy. Skin: warm and dry, no rash. Neuro:  Strength and sensation are intact. Psych: Normal affect.  Accessory Clinical Findings    ECG personally reviewed by me today - No EKG in office today.     Lab Results  Component Value Date   WBC 8.1 12/13/2021   HGB 12.1  12/13/2021   HCT 35.5 (L) 12/13/2021   MCV 91.7 12/13/2021   PLT 287 12/13/2021   Lab Results  Component Value Date   CREATININE 0.92 12/13/2021   BUN 17 12/13/2021   NA 135 12/13/2021   K 4.0 12/13/2021   CL 101 12/13/2021   CO2 26 12/13/2021   Lab Results  Component Value Date   ALT 22 12/03/2021   AST 26 12/03/2021   ALKPHOS 51 12/03/2021   BILITOT 0.9 12/03/2021   Lab Results  Component Value Date   CHOL 142 12/03/2021   HDL 63 12/03/2021   LDLCALC 47 12/03/2021   TRIG 160 (H) 12/03/2021   CHOLHDL 2.3 12/03/2021    Lab Results  Component Value Date   HGBA1C 5.8 (H) 12/03/2021    Assessment & Plan    1. Takotsubo/stress-induced cardiomyopathy: Echo in 11/2021 showed EF 30 to 35% with akinesis of the mid to apical inferior, mid apical anterior, and mid to apical septal segments as well as G1 DD, distant with stress-induced cardiomyopathy.  Showed moderate nonobstructive CAD as below.  Euvolemic and well compensated on exam. Will repeat echo. Continue current medications as below.   2. Non-obstructive CAD: Cath in 11/2021 showed moderate nonobstructive CAD. Stable with no anginal symptoms.  Continue aspirin, carvedilol, losartan, spironolactone, Imdur, Repatha, and  Zetia.  3. Hypertension: BP well controlled. Continue current antihypertensive regimen.   4. Hyperlipidemia: LDL was 47 in 05/2021. Continue Repeatha, Zetia.   5. Prediabetes: A1c was 5.8 in 11/2021. Monitored and managed per PCP.   6. OSA: Adherent to CPAP. Denies any concerns today.   7. Hyponatremia: Sodium was 135 in 11/2021. Will repeat BMET.   8. L carotid bruit: Appreciate on exam today. Will check carotid dopplers.   9. Disposition: Follow-up in 3-4 months.       Lenna Sciara, NP 02/16/2022, 9:59 AM

## 2022-02-19 ENCOUNTER — Telehealth: Payer: Self-pay

## 2022-02-19 NOTE — Telephone Encounter (Signed)
Spoke with pt. Pt was notified of lab results, will continue current medications and follow up as planned.

## 2022-02-20 ENCOUNTER — Ambulatory Visit (HOSPITAL_COMMUNITY)
Admission: RE | Admit: 2022-02-20 | Discharge: 2022-02-20 | Disposition: A | Discharge status: Home / Self Care | Payer: PPO | Source: Ambulatory Visit | Attending: Nurse Practitioner | Admitting: Nurse Practitioner

## 2022-02-20 DIAGNOSIS — R0989 Other specified symptoms and signs involving the circulatory and respiratory systems: Secondary | ICD-10-CM | POA: Diagnosis not present

## 2022-02-21 DIAGNOSIS — G4733 Obstructive sleep apnea (adult) (pediatric): Secondary | ICD-10-CM | POA: Diagnosis not present

## 2022-02-27 ENCOUNTER — Telehealth: Payer: Self-pay

## 2022-02-27 NOTE — Telephone Encounter (Signed)
Lmom to discuss VAS US Carotid results. Waiting on a return call.

## 2022-02-28 ENCOUNTER — Other Ambulatory Visit: Payer: Self-pay | Admitting: Internal Medicine

## 2022-03-03 NOTE — Telephone Encounter (Signed)
Spoke with pt. Pt was notified Carotid ultrasound results. Pt will continue her current medications and follow up as planned.

## 2022-03-04 ENCOUNTER — Other Ambulatory Visit (HOSPITAL_COMMUNITY): Payer: PPO

## 2022-03-11 ENCOUNTER — Ambulatory Visit: Payer: PPO | Admitting: Student

## 2022-03-16 NOTE — Telephone Encounter (Signed)
Sent message to review

## 2022-03-17 NOTE — Telephone Encounter (Signed)
Lmom waiting on a return call from pt.

## 2022-03-24 DIAGNOSIS — G4733 Obstructive sleep apnea (adult) (pediatric): Secondary | ICD-10-CM | POA: Diagnosis not present

## 2022-03-30 NOTE — Telephone Encounter (Signed)
Spoke with pt. Pt is aware that Alcohol in moderation should be ok per Diona Browner, NP

## 2022-03-31 DIAGNOSIS — G4733 Obstructive sleep apnea (adult) (pediatric): Secondary | ICD-10-CM | POA: Diagnosis not present

## 2022-03-31 DIAGNOSIS — F332 Major depressive disorder, recurrent severe without psychotic features: Secondary | ICD-10-CM | POA: Diagnosis not present

## 2022-04-02 ENCOUNTER — Other Ambulatory Visit: Payer: Self-pay | Admitting: Internal Medicine

## 2022-04-21 ENCOUNTER — Telehealth: Payer: Self-pay | Admitting: Internal Medicine

## 2022-04-21 NOTE — Progress Notes (Signed)
Cardiology Office Note:    Date:  04/24/2022   ID:  Kayla Deleon, DOB [birth year], MRN [id number]  PCP:  Kayla Fess, MD   [facility] Cardiologist:  Pixie Casino, MD     Referring MD: Kayla Fess, MD   Chief Complaint  Patient presents with   Follow-up    Seen for Dr. Debara Pickett    History of Present Illness:    Kayla Deleon is a 71 y.o. female with a hx of nonobstructive CAD on cath 12/03/2021, stress-induced cardiomyopathy with improved EF, hypertension, hyperlipidemia, prediabetes and obstructive sleep apnea.  Patient was first noted to have abnormal EKG during treatment for COVID and was seen in June 2023 at cardiology office.  A coronary CTA was ordered that only showed nonobstructive disease.  After the CTA, she developed progressive nausea and headache.  She went to the emergency room and was found to have a sodium level of 122 and the new left bundle branch block.  Her troponin was all also elevated.  She was started on IV heparin.  Due to recurrent chest pain and EKG changes including anterolateral ST elevation and T wave inversion, code STEMI was called and patient was transferred urgently to the Cath Lab.  Emergent cardiac catheterization revealed an moderate nonobstructive CAD but severe LV dysfunction consistent with Takotsubo cardiomyopathy.  Echocardiogram showed EF 30 to 35% with akinesis of the mid to apical inferior, mid to apical anterior, and mid to apical septal segment, grade 1 DD, echocardiogram findings consistent with Takotsubo cardiomyopathy.  Post cath course complicated by acute delirium.  PCCM felt delirium was a reaction to fentanyl and antiemetic medication was underlying hyponatremia.  Hyponatremia was felt to be due to excessive fluid intake prior to CTA followed by nausea and vomiting.  She was started on GDMT with losartan, Coreg and spironolactone.  She went back to the ED on 12/13/2021 with recurrent chest pain.  Imdur was increased to  60 mg daily.  Echocardiogram obtained on 01/29/2022 showed EF 60 to 65%, mild LVH of basal septal segment, no regional wall motion abnormality, trivial MR.  Patient was last seen by Diona Browner NP on 02/16/2022, her blood pressure was very well-controlled.  She was still having intermittent cough which was still residual symptoms from her COVID infection from June 2023.  It was recommended she have a repeat echocardiogram, she was also noted to have a left carotid bruit and carotid Doppler was ordered.  Carotid Doppler was normal.   Patient presents today for follow-up.  For the past week, she has been having intermittent left-sided twisting sensation in the chest that radiating to the back.  Symptom would last roughly 10 minutes.  Nitroglycerin helps with the symptoms.  Interestingly, the chest discomfort only occurs at rest, not occurs when she does Zumba class.  She usually dance to the music in the [employer] class for roughly an hour each time.  She did Zumba class for the past 2 days without any issue.  EKG showed normal sinus rhythm, poor R wave progression in anterior leads.  When compared to EKG in July, she had more T wave inversion in the anterior leads in July, however she had normal R wave progression back then.  I reviewed her EKG and her case with DOD Dr. Martinique, both of [place] felt her symptom is quite atypical.  Her blood pressure does not allow me to uptitrate antianginal medication.  At this time, I recommended continue observation and  reassess in 2 weeks.  As long as her symptoms improve, we would not recommend any further workup.   Past Medical History:  Diagnosis Date   Anxiety    Cervical spondylosis    evaluated in the past by neurossurgeon   Depression    Fibroids    in the past    Hypercholesteremia    Hypertension    Mental disorder    Sleep apnea    on CPAP    Past Surgical History:  Procedure Laterality Date   CESAREAN SECTION     CHOLECYSTECTOMY     LEFT HEART CATH AND  CORONARY ANGIOGRAPHY N/A 12/03/2021   Procedure: LEFT HEART CATH AND CORONARY ANGIOGRAPHY;  Surgeon: Sherren Mocha, MD;  Location: [facility];  Service: Cardiovascular;  Laterality: N/A;    Current Medications: Current Meds  Medication Sig   aspirin EC 81 MG tablet Take 1 tablet (81 mg total) by mouth daily. Swallow whole.   augmented betamethasone dipropionate (DIPROLENE-AF) 0.05 % cream Apply 1 Application topically 2 (two) times daily as needed for rash.   buPROPion (WELLBUTRIN SR) 200 MG 12 hr tablet Take 200 mg by mouth daily.   carvedilol (COREG) 6.25 MG tablet Take 1 tablet (6.25 mg total) by mouth 2 (two) times daily with a meal.   EPINEPHrine 0.3 mg/0.3 mL IJ SOAJ injection Inject 0.3 mg into the muscle daily as needed for anaphylaxis.   escitalopram (LEXAPRO) 20 MG tablet Take 1 tablet (20 mg total) by mouth daily. For depression and anxiety.   ezetimibe (ZETIA) 10 MG tablet Take 10 mg by mouth daily.   isosorbide mononitrate (IMDUR) 60 MG 24 hr tablet TAKE 1 TABLET BY MOUTH EVERY DAY   lamoTRIgine (LAMICTAL) 25 MG tablet Take 50 mg by mouth daily.   losartan (COZAAR) 50 MG tablet Take 1 tablet (50 mg total) by mouth at bedtime.   Multiple Vitamin (MULTIVITAMIN) tablet Take 1 tablet by mouth daily.   nitroGLYCERIN (NITROSTAT) 0.4 MG SL tablet Place 1 tablet (0.4 mg total) under the tongue every 5 (five) minutes as needed for chest pain (up to 3 doses. If taking 3rd dose call 911).   Omega-3 Fatty Acids (FISH OIL PO) Take 2 capsules by mouth daily.   omeprazole (PRILOSEC) 40 MG capsule Take 40 mg by mouth daily as needed (acid reflux).   Probiotic Product (PROBIOTIC PO) Take 1 capsule by mouth daily.   REPATHA SURECLICK 269 MG/ML SOAJ INJECT 1 DOSE INTO THE SKIN EVERY 14 (FOURTEEN) DAYS.   spironolactone (ALDACTONE) 25 MG tablet Take 1 tablet (25 mg total) by mouth daily.     Allergies:   Shellfish allergy, Fentanyl, Percocet [oxycodone-acetaminophen], and Statins    Social History   Socioeconomic History   Marital status: Married    Spouse name: Not on file   Number of children: 1   Years of education: Not on file   Highest education level: Bachelor's degree (e.g., BA, AB, BS)  Occupational History   Not on file  Tobacco Use   Smoking status: Former    Packs/day: 0.10    Years: 1.00    Total pack years: 0.10    Types: Cigarettes    Quit date: 05/19/1975    Years since quitting: 46.9   Smokeless tobacco: Never   Tobacco comments:    Smoked in college- occasional/social   Vaping Use   Vaping Use: Never used  Substance and Sexual Activity   Alcohol use: Yes    Alcohol/week:  0.0 standard drinks of alcohol    Comment: social; 4 beers a month max   Drug use: No   Sexual activity: Not on file  Other Topics Concern   Not on file  Social History Narrative   Lives at home with her husband   Right handed   Drinks 3 diet sodas daily   Social Determinants of Health   Financial Resource Strain: Not on file  Food Insecurity: Not on file  Transportation Needs: Not on file  Physical Activity: Not on file  Stress: Not on file  Social Connections: Not on file     Family History: The patient's family history includes Gout in her brother; Heart attack in her father; Hyperlipidemia in her brother and father; Hypertension in her father; Other in her mother. There is no history of Neuropathy.  ROS:   Please see the history of present illness.     All other systems reviewed and are negative.  EKGs/Labs/Other Studies Reviewed:    The following studies were reviewed today:  Echo 01/29/2022 1. LV function normalized compared to 12/03/21.   2. Left ventricular ejection fraction, by estimation, is 60 to 65%. The  left ventricle has normal function. The left ventricle has no regional  wall motion abnormalities. There is mild left ventricular hypertrophy of  the basal-septal segment. Left  ventricular diastolic parameters were normal.   3. Right  ventricular systolic function is normal. The right ventricular  size is normal. Tricuspid regurgitation signal is inadequate for assessing  PA pressure.   4. The mitral valve is normal in structure. Trivial mitral valve  regurgitation. No evidence of mitral stenosis.   5. The aortic valve is tricuspid. Aortic valve regurgitation is not  visualized. Aortic valve sclerosis is present, with no evidence of aortic  valve stenosis.   6. The inferior vena cava is normal in size with greater than 50%  respiratory variability, suggesting right atrial pressure of 3 mmHg.   EKG:  EKG is ordered today.  The ekg ordered today demonstrates normal sinus rhythm, poor progression in the anterior leads.  Recent Labs: 12/03/2021: ALT 22 12/05/2021: Magnesium 2.4 12/13/2021: Hemoglobin 12.1; Platelets 287 02/16/2022: BUN 20; Creatinine, Ser 1.00; Potassium 4.3; Sodium 141  Recent Lipid Panel    Component Value Date/Time   CHOL 142 12/03/2021 0837   TRIG 160 (H) 12/03/2021 0837   HDL 63 12/03/2021 0837   CHOLHDL 2.3 12/03/2021 0837   VLDL 32 12/03/2021 0837   LDLCALC 47 12/03/2021 0837     Risk Assessment/Calculations:           Physical Exam:    VS:  BP 106/60 (BP Location: Left Arm, Patient Position: Sitting, Cuff Size: Normal)   Pulse 66   Ht '5\' 4"'$  (1.626 m)   Wt 144 lb 3.2 oz (65.4 kg)   SpO2 94%   BMI 24.75 kg/m        Wt Readings from Last 3 Encounters:  04/22/22 144 lb 3.2 oz (65.4 kg)  02/16/22 144 lb 6.4 oz (65.5 kg)  12/13/21 139 lb (63 kg)     GEN:  Well nourished, well developed in no acute distress HEENT: Normal NECK: No JVD; No carotid bruits LYMPHATICS: No lymphadenopathy CARDIAC: RRR, no murmurs, rubs, gallops RESPIRATORY:  Clear to auscultation without rales, wheezing or rhonchi  ABDOMEN: Soft, non-tender, non-distended MUSCULOSKELETAL:  No edema; No deformity  SKIN: Warm and dry NEUROLOGIC:  Alert and oriented x 3 PSYCHIATRIC:  Normal affect   ASSESSMENT:  1. Atypical chest pain   2. Coronary artery disease involving native coronary artery of native heart with angina pectoris ([place])   3. Takotsubo cardiomyopathy  =>  (Initial Dx was Possible Anterior STEMI - Changed)   4. Essential hypertension    PLAN:    In order of problems listed above:  Atypical chest pain: She has been having intermittent chest discomfort, symptom does not occur with physical exertion.  She was able to dance for a hour in her Zumba class for 2 consecutive days this week without any chest discomfort.  Previous cardiac catheterization performed in July 2023 showed nonobstructive disease.  I discussed her case with DOD Dr. Martinique, we recommended continue observation at this time.  Her blood pressure is borderline, I am unable to uptitrate antianginal medication  CAD: Cardiac catheterization performed on 12/03/2021 showed 50% mid LAD, 50% D1 lesion.  See #1.  Continue aspirin, carvedilol, Imdur and Repatha  Takotsubo cardiomyopathy: Previous baseline EF was 30 to 35%, most recent echocardiogram performed in September 2023, EF has improved to 60 to 65%.  She is euvolemic on exam  Hypertension: Blood pressure stable.  With normalization of her ejection fraction, EF blood pressure remain low in the future, may stop spironolactone.           Medication Adjustments/Labs and Tests Ordered: Current medicines are reviewed at length with the patient today.  Concerns regarding medicines are outlined above.  Orders Placed This Encounter  Procedures   EKG 12-Lead   No orders of the defined types were placed in this encounter.   Patient Instructions  Medication Instructions:   Your physician recommends that you continue on your current medications as directed. Please refer to the Current Medication list given to you today.  *If you need a refill on your cardiac medications before your next appointment, please call your pharmacy*  Lab Work: NONE ordered at this time of  appointment   If you have labs (blood work) drawn today and your tests are completely normal, you will receive your results only by: [place] (if you have MyChart) OR A paper copy in the mail If you have any lab test that is abnormal or we need to change your treatment, we will call you to review the results.  Testing/Procedures: NONE ordered at this time of appointment   Follow-Up: At [facility], you and your health needs are our priority.  As part of our continuing mission to provide you with exceptional heart care, we have created designated Provider Care Teams.  These Care Teams include your primary Cardiologist (physician) and Advanced Practice Providers (APPs -  Physician Assistants and Nurse Practitioners) who all work together to provide you with the care you need, when you need it.  Your next appointment:   2 week(s)  The format for your next appointment:   In Person  Provider:   Almyra Deforest, PA-C     Other Instructions  Important Information About Sugar         Hilbert Corrigan, [place]  04/24/2022 2:47 PM    [place]

## 2022-04-21 NOTE — Telephone Encounter (Signed)
Spoke to patient she stated she has been having chest pain with pain radiating into back off and on for the past 1 week.Stated pain is relieved by NTG x 2 each time.No pain at present.Appointment scheduled with Almyra Deforest PA 12/6 at 8:25 am.Advised to go to ED if chest pain worsens.

## 2022-04-21 NOTE — Telephone Encounter (Signed)
This was sent via mychart to the scheduling pool:     Per my message.  I have experienced chest pressure about 3 times over the last week. No other symptoms mentioned in your message. Per my message I have take nitroglycerin which has eased the pressure.  It goes away.  Was looking to get a follow up appt  Thank you, Kayla Deleon    Good morning Kayla Deleon,  Can you tell me a little more about you chest pressure?     1. Are you having CP right now?   2. Are you experiencing any other symptoms (ex. SOB, nausea, vomiting, sweating)?   3. How long have you been experiencing CP?   4. Is your CP continuous or coming and going?   5. Have you taken Nitroglycerin? ?    Appointment Request From: Kayla Deleon  With Provider: Lenna Sciara, NP [facility]]  Preferred Date Range: 04/20/2022 - 05/01/2022  Preferred Times: Any Time  Reason for visit: Office Visit  Comments: Have had slight pressure in chest on 3 occasions.  Took 2 doses of nitroglycerin which eased off pressure.  Would like to follow -up with office visit.  Thank you.

## 2022-04-22 ENCOUNTER — Ambulatory Visit: Payer: PPO | Attending: Physician Assistant | Admitting: Physician Assistant

## 2022-04-22 ENCOUNTER — Encounter: Payer: Self-pay | Admitting: Physician Assistant

## 2022-04-22 VITALS — BP 106/60 | HR 66 | Ht 64.0 in | Wt 144.2 lb

## 2022-04-22 DIAGNOSIS — I5181 Takotsubo syndrome: Secondary | ICD-10-CM | POA: Diagnosis not present

## 2022-04-22 DIAGNOSIS — I25119 Atherosclerotic heart disease of native coronary artery with unspecified angina pectoris: Secondary | ICD-10-CM | POA: Diagnosis not present

## 2022-04-22 DIAGNOSIS — I10 Essential (primary) hypertension: Secondary | ICD-10-CM

## 2022-04-22 DIAGNOSIS — R0789 Other chest pain: Secondary | ICD-10-CM

## 2022-04-22 NOTE — Patient Instructions (Addendum)
Medication Instructions:   Your physician recommends that you continue on your current medications as directed. Please refer to the Current Medication list given to you today.  *If you need a refill on your cardiac medications before your next appointment, please call your pharmacy*  Lab Work: NONE ordered at this time of appointment   If you have labs (blood work) drawn today and your tests are completely normal, you will receive your results only by: [place] (if you have MyChart) OR A paper copy in the mail If you have any lab test that is abnormal or we need to change your treatment, we will call you to review the results.  Testing/Procedures: NONE ordered at this time of appointment   Follow-Up: At [facility], you and your health needs are our priority.  As part of our continuing mission to provide you with exceptional heart care, we have created designated Provider Care Teams.  These Care Teams include your primary Cardiologist (physician) and Advanced Practice Providers (APPs -  Physician Assistants and Nurse Practitioners) who all work together to provide you with the care you need, when you need it.  Your next appointment:   2 week(s)  The format for your next appointment:   In Person  Provider:   Almyra Deforest, PA-C     Other Instructions  Important Information About Sugar

## 2022-04-23 DIAGNOSIS — G4733 Obstructive sleep apnea (adult) (pediatric): Secondary | ICD-10-CM | POA: Diagnosis not present

## 2022-04-24 ENCOUNTER — Encounter: Payer: Self-pay | Admitting: Physician Assistant

## 2022-05-07 ENCOUNTER — Ambulatory Visit: Payer: PPO | Attending: Physician Assistant | Admitting: Physician Assistant

## 2022-05-07 ENCOUNTER — Telehealth: Payer: Self-pay | Admitting: Internal Medicine

## 2022-05-07 ENCOUNTER — Encounter: Payer: Self-pay | Admitting: Physician Assistant

## 2022-05-07 VITALS — BP 116/68 | HR 70 | Ht 64.0 in | Wt 144.8 lb

## 2022-05-07 DIAGNOSIS — I2510 Atherosclerotic heart disease of native coronary artery without angina pectoris: Secondary | ICD-10-CM | POA: Diagnosis not present

## 2022-05-07 DIAGNOSIS — I5181 Takotsubo syndrome: Secondary | ICD-10-CM

## 2022-05-07 DIAGNOSIS — E785 Hyperlipidemia, unspecified: Secondary | ICD-10-CM | POA: Diagnosis not present

## 2022-05-07 DIAGNOSIS — I10 Essential (primary) hypertension: Secondary | ICD-10-CM

## 2022-05-07 NOTE — Progress Notes (Signed)
Cardiology Office Note:    Date:  05/09/2022   ID:  Kayla Deleon, DOB May 03, 1951, MRN [id number]  PCP:  Hulan Fess, MD   [facility] Cardiologist:  Pixie Casino, MD     Referring MD: Hulan Fess, MD   Chief Complaint  Patient presents with   Follow-up    Seen for Dr. Debara Pickett    History of Present Illness:    Kayla Deleon is a 71 y.o. female with a hx of nonobstructive CAD on cath 12/03/2021, stress-induced cardiomyopathy with improved EF, hypertension, hyperlipidemia, prediabetes and obstructive sleep apnea.  Patient was first noted to have abnormal EKG during treatment for COVID and was seen in June 2023 at cardiology office.  A coronary CTA was ordered that only showed nonobstructive disease.  After the CTA, she developed progressive nausea and headache.  She went to the emergency room and was found to have a sodium level of 122 and the new left bundle branch block.  Her troponin was all also elevated.  She was started on IV heparin.  Due to recurrent chest pain and EKG changes including anterolateral ST elevation and T wave inversion, code STEMI was called and patient was transferred urgently to the Cath Lab.  Emergent cardiac catheterization revealed moderate nonobstructive CAD but severe LV dysfunction consistent with Takotsubo cardiomyopathy.  Echocardiogram showed EF 30 to 35% with akinesis of the mid to apical inferior, mid to apical anterior, and mid to apical septal segment, grade 1 DD, echocardiogram findings consistent with Takotsubo cardiomyopathy.  Post cath course complicated by acute delirium.  PCCM felt delirium was a reaction to fentanyl and antiemetic medication was underlying hyponatremia.  Hyponatremia was felt to be due to excessive fluid intake prior to CTA followed by nausea and vomiting.  She was started on GDMT with losartan, Coreg and spironolactone.  She went back to the ED on 12/13/2021 with recurrent chest pain.  Imdur was increased to  60 mg daily.  Echocardiogram obtained on 01/29/2022 showed EF 60 to 65%, mild LVH of basal septal segment, no regional wall motion abnormality, trivial MR. Carotid Doppler obtained on 02/20/2022 was normal.  I last saw the patient on 04/22/2022, she was still having intermittent left sided twisting sensation in the chest radiating to the back.  Symptom lasted roughly 10 minutes, nitroglycerin helped with the symptom.  Interestingly, her chest pain usually occurs at rest but does not occur when she does Zumba class.  She had a dance for a hour during the Dumba class.  I reviewed her case with DOD Dr. [place], both of [place] felt her symptoms were quite atypical.  Patient presents today for reassessment.  Her intermittent chest pain is still present, however has shown no significant change from the last visit.  Her symptom does not occur with physical activity.  She has tried to take some of the over-the-counter Pepcid, she says they help with the symptoms, however I am not confident that her symptom is truly related to acid reflux.  She says it feels different from her previous acid reflux issue.  It does not worsen with deep inspiration, body rotation or palpation.  I recommended continue observation given the atypical nature.  She just went through Zumba class yesterday and has not had any issue dancing through the class.  The chest pain does not come on at all with physical activity.  She can follow-up with Dr. Debara Pickett as previously scheduled.   Past Medical History:  Diagnosis  Date   Anxiety    Cervical spondylosis    evaluated in the past by neurossurgeon   Depression    Fibroids    in the past    Hypercholesteremia    Hypertension    Mental disorder    Sleep apnea    on CPAP    Past Surgical History:  Procedure Laterality Date   CESAREAN SECTION     CHOLECYSTECTOMY     LEFT HEART CATH AND CORONARY ANGIOGRAPHY N/A 12/03/2021   Procedure: LEFT HEART CATH AND CORONARY ANGIOGRAPHY;  Surgeon: Sherren Mocha, MD;  Location: [facility];  Service: Cardiovascular;  Laterality: N/A;    Current Medications: Current Meds  Medication Sig   aspirin EC 81 MG tablet Take 1 tablet (81 mg total) by mouth daily. Swallow whole.   augmented betamethasone dipropionate (DIPROLENE-AF) 0.05 % cream Apply 1 Application topically 2 (two) times daily as needed for rash.   buPROPion (WELLBUTRIN SR) 200 MG 12 hr tablet Take 200 mg by mouth daily.   carvedilol (COREG) 6.25 MG tablet Take 1 tablet (6.25 mg total) by mouth 2 (two) times daily with a meal.   EPINEPHrine 0.3 mg/0.3 mL IJ SOAJ injection Inject 0.3 mg into the muscle daily as needed for anaphylaxis.   escitalopram (LEXAPRO) 20 MG tablet Take 1 tablet (20 mg total) by mouth daily. For depression and anxiety.   ezetimibe (ZETIA) 10 MG tablet Take 10 mg by mouth daily.   famotidine (PEPCID) 40 MG tablet Take 40 mg by mouth as needed for heartburn or indigestion.   isosorbide mononitrate (IMDUR) 60 MG 24 hr tablet TAKE 1 TABLET BY MOUTH EVERY DAY   lamoTRIgine (LAMICTAL) 25 MG tablet Take 50 mg by mouth daily.   losartan (COZAAR) 50 MG tablet Take 1 tablet (50 mg total) by mouth at bedtime.   Multiple Vitamin (MULTIVITAMIN) tablet Take 1 tablet by mouth daily.   nitroGLYCERIN (NITROSTAT) 0.4 MG SL tablet Place 1 tablet (0.4 mg total) under the tongue every 5 (five) minutes as needed for chest pain (up to 3 doses. If taking 3rd dose call 911).   Omega-3 Fatty Acids (FISH OIL PO) Take 2 capsules by mouth daily.   Probiotic Product (PROBIOTIC PO) Take 1 capsule by mouth daily.   REPATHA SURECLICK 168 MG/ML SOAJ INJECT 1 DOSE INTO THE SKIN EVERY 14 (FOURTEEN) DAYS.   spironolactone (ALDACTONE) 25 MG tablet Take 1 tablet (25 mg total) by mouth daily.     Allergies:   Shellfish allergy, Fentanyl, Percocet [oxycodone-acetaminophen], and Statins   Social History   Socioeconomic History   Marital status: Married    Spouse name: Not on file   Number  of children: 1   Years of education: Not on file   Highest education level: Bachelor's degree (e.g., BA, AB, BS)  Occupational History   Not on file  Tobacco Use   Smoking status: Former    Packs/day: 0.10    Years: 1.00    Total pack years: 0.10    Types: Cigarettes    Quit date: 05/19/1975    Years since quitting: 47.0   Smokeless tobacco: Never   Tobacco comments:    Smoked in college- occasional/social   Vaping Use   Vaping Use: Never used  Substance and Sexual Activity   Alcohol use: Yes    Alcohol/week: 0.0 standard drinks of alcohol    Comment: social; 4 beers a month max   Drug use: No   Sexual activity:  Not on file  Other Topics Concern   Not on file  Social History Narrative   Lives at home with her husband   Right handed   Drinks 3 diet sodas daily   Social Determinants of Health   Financial Resource Strain: Not on file  Food Insecurity: Not on file  Transportation Needs: Not on file  Physical Activity: Not on file  Stress: Not on file  Social Connections: Not on file     Family History: The patient's family history includes Gout in her brother; Heart attack in her father; Hyperlipidemia in her brother and father; Hypertension in her father; Other in her mother. There is no history of Neuropathy.  ROS:   Please see the history of present illness.     All other systems reviewed and are negative.  EKGs/Labs/Other Studies Reviewed:    The following studies were reviewed today:  Cath 12/03/2021   Mid LAD lesion is 50% stenosed.   1st Diag lesion is 50% stenosed.   There is severe left ventricular systolic dysfunction.   LV end diastolic pressure is normal.   1.  Moderate nonobstructive stenosis of the mid LAD 2.  Mild nonobstructive plaquing of the left circumflex and right coronary arteries 3.  Severe segmental LV systolic dysfunction with periapical ballooning, hyperkinetic base, LVEF estimated at about 35%, findings consistent with acute Takotsubo  syndrome   Recommend: CV-ICU monitoring overnight, beta-blocker and ARB, aspirin 81 mg daily, continued risk reduction measures for nonobstructive CAD.  Check 2D echo.    Echo 01/29/2022 1. LV function normalized compared to 12/03/21.   2. Left ventricular ejection fraction, by estimation, is 60 to 65%. The  left ventricle has normal function. The left ventricle has no regional  wall motion abnormalities. There is mild left ventricular hypertrophy of  the basal-septal segment. Left  ventricular diastolic parameters were normal.   3. Right ventricular systolic function is normal. The right ventricular  size is normal. Tricuspid regurgitation signal is inadequate for assessing  PA pressure.   4. The mitral valve is normal in structure. Trivial mitral valve  regurgitation. No evidence of mitral stenosis.   5. The aortic valve is tricuspid. Aortic valve regurgitation is not  visualized. Aortic valve sclerosis is present, with no evidence of aortic  valve stenosis.   6. The inferior vena cava is normal in size with greater than 50%  respiratory variability, suggesting right atrial pressure of 3 mmHg.    EKG:  EKG is not ordered today.    Recent Labs: 12/03/2021: ALT 22 12/05/2021: Magnesium 2.4 12/13/2021: Hemoglobin 12.1; Platelets 287 02/16/2022: BUN 20; Creatinine, Ser 1.00; Potassium 4.3; Sodium 141  Recent Lipid Panel    Component Value Date/Time   CHOL 142 12/03/2021 0837   TRIG 160 (H) 12/03/2021 0837   HDL 63 12/03/2021 0837   CHOLHDL 2.3 12/03/2021 0837   VLDL 32 12/03/2021 0837   LDLCALC 47 12/03/2021 0837     Risk Assessment/Calculations:           Physical Exam:    VS:  BP 116/68   Pulse 70   Ht '5\' 4"'$  (1.626 m)   Wt 144 lb 12.8 oz (65.7 kg)   SpO2 98%   BMI 24.85 kg/m        Wt Readings from Last 3 Encounters:  05/07/22 144 lb 12.8 oz (65.7 kg)  04/22/22 144 lb 3.2 oz (65.4 kg)  02/16/22 144 lb 6.4 oz (65.5 kg)     GEN:  Well nourished, well  developed in no acute distress HEENT: Normal NECK: No JVD; No carotid bruits LYMPHATICS: No lymphadenopathy CARDIAC: RRR, no murmurs, rubs, gallops RESPIRATORY:  Clear to auscultation without rales, wheezing or rhonchi  ABDOMEN: Soft, non-tender, non-distended MUSCULOSKELETAL:  No edema; No deformity  SKIN: Warm and dry NEUROLOGIC:  Alert and oriented x 3 PSYCHIATRIC:  Normal affect   ASSESSMENT:    1. Coronary artery disease involving native coronary artery of native heart without angina pectoris   2. Essential hypertension   3. Hyperlipidemia LDL goal <70   4. Takotsubo cardiomyopathy    PLAN:    In order of problems listed above:  CAD: Although patient continues to have twinges in the chest, however symptom does not occur with physical exertion.  Will hold off on further ischemic workup especially since recent cardiac catheterization showed nonobstructive disease  Hypertension: Blood pressure stable  Hyperlipidemia: On Zetia and Repatha  Takotsubo cardiomyopathy: EF improved on last echocardiogram.  Continue spironolactone, losartan and carvedilol.           Medication Adjustments/Labs and Tests Ordered: Current medicines are reviewed at length with the patient today.  Concerns regarding medicines are outlined above.  No orders of the defined types were placed in this encounter.  No orders of the defined types were placed in this encounter.   Patient Instructions  Medication Instructions:  No Changes *If you need a refill on your cardiac medications before your next appointment, please call your pharmacy*   Lab Work: No Labs If you have labs (blood work) drawn today and your tests are completely normal, you will receive your results only by: [place] (if you have MyChart) OR A paper copy in the mail If you have any lab test that is abnormal or we need to change your treatment, we will call you to review the results.   Testing/Procedures: No  Testing   Follow-Up: At [facility], you and your health needs are our priority.  As part of our continuing mission to provide you with exceptional heart care, we have created designated Provider Care Teams.  These Care Teams include your primary Cardiologist (physician) and Advanced Practice Providers (APPs -  Physician Assistants and Nurse Practitioners) who all work together to provide you with the care you need, when you need it.  We recommend signing up for the patient portal called "MyChart".  Sign up information is provided on this After Visit Summary.  MyChart is used to connect with patients for Virtual Visits (Telemedicine).  Patients are able to view lab/test results, encounter notes, upcoming appointments, etc.  Non-urgent messages can be sent to your provider as well.   To learn more about what you can do with MyChart, go to [email or web address].    Your next appointment:   Follow up As Scheduled  The format for your next appointment:   In Person  Provider:   Pixie Casino, MD    Signed, Almyra Deforest, [place]  05/09/2022 11:11 PM    [place]

## 2022-05-07 NOTE — Telephone Encounter (Signed)
PA for repatha submitted via CMM (Key: [id number])

## 2022-05-07 NOTE — Patient Instructions (Signed)
Medication Instructions:  No Changes *If you need a refill on your cardiac medications before your next appointment, please call your pharmacy*   Lab Work: No Labs If you have labs (blood work) drawn today and your tests are completely normal, you will receive your results only by: [place] (if you have MyChart) OR A paper copy in the mail If you have any lab test that is abnormal or we need to change your treatment, we will call you to review the results.   Testing/Procedures: No Testing   Follow-Up: At [facility], you and your health needs are our priority.  As part of our continuing mission to provide you with exceptional heart care, we have created designated Provider Care Teams.  These Care Teams include your primary Cardiologist (physician) and Advanced Practice Providers (APPs -  Physician Assistants and Nurse Practitioners) who all work together to provide you with the care you need, when you need it.  We recommend signing up for the patient portal called "MyChart".  Sign up information is provided on this After Visit Summary.  MyChart is used to connect with patients for Virtual Visits (Telemedicine).  Patients are able to view lab/test results, encounter notes, upcoming appointments, etc.  Non-urgent messages can be sent to your provider as well.   To learn more about what you can do with MyChart, go to [email or web address].    Your next appointment:   Follow up As Scheduled  The format for your next appointment:   In Person  Provider:   Pixie Casino, MD

## 2022-05-07 NOTE — Telephone Encounter (Signed)
Approved  [id number] Repatha SureClick '140MG'$ /ML [place] SOAJ Quantity:2;

## 2022-05-09 ENCOUNTER — Encounter: Payer: Self-pay | Admitting: Physician Assistant

## 2022-05-24 DIAGNOSIS — G4733 Obstructive sleep apnea (adult) (pediatric): Secondary | ICD-10-CM | POA: Diagnosis not present

## 2022-05-29 ENCOUNTER — Other Ambulatory Visit: Payer: Self-pay | Admitting: Cardiovascular Disease

## 2022-06-11 ENCOUNTER — Encounter: Payer: Self-pay | Admitting: Internal Medicine

## 2022-06-11 ENCOUNTER — Telehealth: Payer: Self-pay

## 2022-06-11 ENCOUNTER — Ambulatory Visit: Payer: PPO | Attending: Internal Medicine | Admitting: Internal Medicine

## 2022-06-11 VITALS — BP 114/62 | HR 74 | Ht 64.0 in | Wt 142.4 lb

## 2022-06-11 DIAGNOSIS — T466X5D Adverse effect of antihyperlipidemic and antiarteriosclerotic drugs, subsequent encounter: Secondary | ICD-10-CM | POA: Diagnosis not present

## 2022-06-11 DIAGNOSIS — I5181 Takotsubo syndrome: Secondary | ICD-10-CM | POA: Diagnosis not present

## 2022-06-11 DIAGNOSIS — M791 Myalgia, unspecified site: Secondary | ICD-10-CM | POA: Diagnosis not present

## 2022-06-11 DIAGNOSIS — I2510 Atherosclerotic heart disease of native coronary artery without angina pectoris: Secondary | ICD-10-CM | POA: Diagnosis not present

## 2022-06-11 DIAGNOSIS — E785 Hyperlipidemia, unspecified: Secondary | ICD-10-CM

## 2022-06-11 NOTE — Patient Instructions (Signed)
Medication Instructions:  Your physician recommends that you continue on your current medications as directed. Please refer to the Current Medication list given to you today.  *If you need a refill on your cardiac medications before your next appointment, please call your pharmacy*   Lab Work: Your physician recommends that you return for lab work in: next week or 2 for FASTING NMR lipid panel & LPA  If you have labs (blood work) drawn today and your tests are completely normal, you will receive your results only by: [place] (if you have MyChart) OR A paper copy in the mail If you have any lab test that is abnormal or we need to change your treatment, we will call you to review the results.   Follow-Up: At [facility], you and your health needs are our priority.  As part of our continuing mission to provide you with exceptional heart care, we have created designated Provider Care Teams.  These Care Teams include your primary Cardiologist (physician) and Advanced Practice Providers (APPs -  Physician Assistants and Nurse Practitioners) who all work together to provide you with the care you need, when you need it.  We recommend signing up for the patient portal called "MyChart".  Sign up information is provided on this After Visit Summary.  MyChart is used to connect with patients for Virtual Visits (Telemedicine).  Patients are able to view lab/test results, encounter notes, upcoming appointments, etc.  Non-urgent messages can be sent to your provider as well.   To learn more about what you can do with MyChart, go to [email or web address].    Your next appointment:   12 month(s)  Provider:   Pixie Casino, MD

## 2022-06-11 NOTE — Addendum Note (Signed)
Addended by: Pixie Casino on: 06/11/2022 11:27 AM   Modules accepted: Level of Service

## 2022-06-11 NOTE — Telephone Encounter (Signed)
Spoke with pt regarding questions she wasn't able to ask Dr. Debara Pickett about her Takotsubo syndrome that she had back in July of 2023. Per Dr. Debara Pickett pt will stay on medications indefinitely. Pt verbalizes understanding

## 2022-06-11 NOTE — Progress Notes (Addendum)
LIPID CLINIC CONSULT NOTE  Chief Complaint:  Follow-up dyslipidemia  Primary Care Physician: Hulan Fess, MD  Primary Cardiologist:  Pixie Casino, MD  HPI:  Kayla Deleon is a 72 y.o. female who is being seen today for the evaluation of dyslipidemia at the request of Little, Kayla Bihari, MD.  This is a pleasant 72 year old female who is cause and is a patient of mine with high cholesterol.  Her father died of MI at age 40 and had high cholesterol and she has 3 brothers who have high cholesterol as well.  There is concern for familial hyperlipidemia.  Most recently her lipid profile showed total cholesterol 261, triglycerides 180, HDL 53 and LDL of 175.  Non-HDL cholesterol 208.  Hemoglobin A1c was 6.2.  Unfortunately, she could not tolerate statin medications.  She has been on a number of them over the years which caused severe myalgias.  She is referred for evaluation and possible PCSK9 inhibitor.  She reports a very healthy diet low in saturated fat and it stays physically active.  At one point her lowest cholesterol numbers were when she weighed about 120 pounds however she has not been at that weight for several years.  04/02/2021  [name] returns today for follow-up.  Her cholesterol remains elevated.  Recent labs showed total cholesterol was 234, HDL 49, LDL 158 and triglycerides 146.  This is improved somewhat.  We had recommended a PCSK9 inhibitor however she is very hesitant about it.  She wanted to try atorvastatin 40 mg but had side effects similar to prior statins.  She was then placed on ezetimibe which I think is helped lower the numbers somewhat.  She is also made some dietary changes, he is exercising more and has had some weight loss.  She is also now on some losartan and has good blood pressure control.  I am concerned about persistently elevated risk.  She does have a calcium score of 117, this was 81st percentile for age and sex matched controls, suggesting age advanced  coronary disease.   06/26/2021  Ms. Kayla Deleon is seen today in follow-up.  She decided to go with Repatha back in the fall.  She was approved for Repatha in November and has been taking it since then.  She has tolerated very well without any of the severe muscle ache she had with the statins.  She has not had her cholesterol reassessed.  She also maintains on Zetia.  06/11/2022  Ms. Kayla Deleon returns today for follow-up. She continues to do well without any side effects on combination of Repatha and zetia. She recently had the medications re approved. She is due for repeat labs. She reports regular exercise and no symptoms with this.  Apparently, she was seen this past July for chest pain.  She underwent a CT coronary angiogram which showed a calcium score of 129 and nonobstructive coronary disease.  Subsequently she had persistent symptoms and presented to the emergency department with ongoing chest pain and her troponin was elevated.  She underwent left heart catheterization by Dr. Burt Knack which demonstrated moderate nonobstructive disease of the mid LAD and some mild nonobstructive plaquing of the circumflex and right coronaries.  LVEF was 35% consistent with acute Takatsubo syndrome.  She was managed medically with this.  Echo confirmed LVEF 30 to 35%.  A subsequent follow-up echo and September 2023 showed recovery of her LVEF up to 60 to 65%.  PMHx:  Past Medical History:  Diagnosis Date   Anxiety  Cervical spondylosis    evaluated in the past by neurossurgeon   Depression    Fibroids    in the past    Hypercholesteremia    Hypertension    Mental disorder    Sleep apnea    on CPAP    Past Surgical History:  Procedure Laterality Date   CESAREAN SECTION     CHOLECYSTECTOMY     LEFT HEART CATH AND CORONARY ANGIOGRAPHY N/A 12/03/2021   Procedure: LEFT HEART CATH AND CORONARY ANGIOGRAPHY;  Surgeon: Sherren Mocha, MD;  Location: [facility];  Service: Cardiovascular;  Laterality: N/A;     FAMHx:  Family History  Problem Relation Age of Onset   Other Mother        brain aneurysm   Heart attack Father    Hypertension Father    Hyperlipidemia Father    Hyperlipidemia Brother    Gout Brother    Neuropathy Neg Hx     SOCHx:   reports that she quit smoking about 47 years ago. Her smoking use included cigarettes. She has a 0.10 pack-year smoking history. She has never used smokeless tobacco. She reports current alcohol use. She reports that she does not use drugs.  ALLERGIES:  Allergies  Allergen Reactions   Shellfish Allergy Anaphylaxis and Rash    Pt. Uses Epipen. Pt. Uses Epipen.    Fentanyl Other (See Comments)    Confusion, sweating   Percocet [Oxycodone-Acetaminophen] Other (See Comments)    Confusion, AMS   Statins Other (See Comments)    Severe muscle pain    ROS: Pertinent items noted in HPI and remainder of comprehensive ROS otherwise negative.  HOME MEDS: Current Outpatient Medications on File Prior to Visit  Medication Sig Dispense Refill   aspirin EC 81 MG tablet Take 1 tablet (81 mg total) by mouth daily. Swallow whole. 30 tablet 11   augmented betamethasone dipropionate (DIPROLENE-AF) 0.05 % cream Apply 1 Application topically 2 (two) times daily as needed for rash.     buPROPion (WELLBUTRIN SR) 200 MG 12 hr tablet Take 200 mg by mouth daily.     carvedilol (COREG) 6.25 MG tablet TAKE 1 TABLET BY MOUTH 2 TIMES DAILY WITH A MEAL. 180 tablet 1   EPINEPHrine 0.3 mg/0.3 mL IJ SOAJ injection Inject 0.3 mg into the muscle daily as needed for anaphylaxis.     escitalopram (LEXAPRO) 20 MG tablet Take 1 tablet (20 mg total) by mouth daily. For depression and anxiety. 30 tablet 0   ezetimibe (ZETIA) 10 MG tablet Take 10 mg by mouth daily.     famotidine (PEPCID) 40 MG tablet Take 40 mg by mouth as needed for heartburn or indigestion.     isosorbide mononitrate (IMDUR) 30 MG 24 hr tablet TAKE 1 TABLET BY MOUTH EVERY DAY 90 tablet 1   isosorbide  mononitrate (IMDUR) 60 MG 24 hr tablet TAKE 1 TABLET BY MOUTH EVERY DAY 90 tablet 1   lamoTRIgine (LAMICTAL) 25 MG tablet Take 50 mg by mouth daily.     losartan (COZAAR) 50 MG tablet TAKE 1 TABLET BY MOUTH EVERYDAY AT BEDTIME 90 tablet 1   Multiple Vitamin (MULTIVITAMIN) tablet Take 1 tablet by mouth daily.     nitroGLYCERIN (NITROSTAT) 0.4 MG SL tablet Place 1 tablet (0.4 mg total) under the tongue every 5 (five) minutes as needed for chest pain (up to 3 doses. If taking 3rd dose call 911). 25 tablet 3   Omega-3 Fatty Acids (FISH OIL PO) Take 2  capsules by mouth daily.     omeprazole (PRILOSEC) 40 MG capsule Take 40 mg by mouth daily as needed (acid reflux).     Probiotic Product (PROBIOTIC PO) Take 1 capsule by mouth daily.     REPATHA SURECLICK 774 MG/ML SOAJ INJECT 1 DOSE INTO THE SKIN EVERY 14 (FOURTEEN) DAYS. 6 mL 3   spironolactone (ALDACTONE) 25 MG tablet TAKE 1 TABLET (25 MG TOTAL) BY MOUTH DAILY. 90 tablet 1   No current facility-administered medications on file prior to visit.    LABS/IMAGING: No results found for this or any previous visit (from the past 48 hour(s)). No results found.  LIPID PANEL:    Component Value Date/Time   CHOL 142 12/03/2021 0837   TRIG 160 (H) 12/03/2021 0837   HDL 63 12/03/2021 0837   CHOLHDL 2.3 12/03/2021 0837   VLDL 32 12/03/2021 0837   LDLCALC 47 12/03/2021 0837    WEIGHTS: Wt Readings from Last 3 Encounters:  06/11/22 142 lb 6.4 oz (64.6 kg)  05/07/22 144 lb 12.8 oz (65.7 kg)  04/22/22 144 lb 3.2 oz (65.4 kg)    VITALS: BP 114/62   Pulse 74   Ht '5\' 4"'$  (1.626 m)   Wt 142 lb 6.4 oz (64.6 kg)   SpO2 98%   BMI 24.44 kg/m   EXAM: Deferred  EKG: Deferred  ASSESSMENT: Probable familial hyperlipidemia Statin intolerance-myalgias Impaired fasting glucose-hemoglobin A1c 6.2 Strong family history of premature coronary disease Hypertension OSA on CPAP Elevated LP(a) at 253.2 nmol/L Recent chest pain with acute Takatsubo  cardiomyopathy, LVEF 30 to 35%, improved to 60 to 65% x 01/2022 Left heart cath showed mild nonobstructive coronary disease.  PLAN: 1.   Ms. Groseclose continues to do well on combination therapy of Repatha and ezetimibe.  Those medications were just reapproved.  She is due for repeat lipids.  Will check a lipid NMR and LP(a) to see if this is come down on therapy as it was previously elevated at 253.2 nmol/L.  She had an episode of Takotsubo cardiomyopathy which was managed by my partners this past summer and her LVEF had recovered by September.  She is on good therapy for this including beta-blocker.  I would suspect these therapies are indefinite.  She should reach out if she gets any worsening shortness of breath or recurrent chest pain.  Plan follow-up annually or sooner as necessary.  Pixie Casino, MD, [facility], [place] Director of the Advanced Lipid Disorders &  Cardiovascular Risk Reduction Clinic Diplomate of the American Board of Clinical Lipidology Attending Cardiologist  Direct Dial: [phone]  Fax: [phone]  Website:  [email or web address]  Nadean Corwin Brysin Towery 06/11/2022, 10:31 AM

## 2022-06-16 ENCOUNTER — Encounter: Payer: Self-pay | Admitting: Podiatry

## 2022-06-16 ENCOUNTER — Ambulatory Visit: Payer: PPO | Admitting: Podiatry

## 2022-06-16 VITALS — BP 106/58 | HR 66

## 2022-06-16 DIAGNOSIS — L600 Ingrowing nail: Secondary | ICD-10-CM | POA: Diagnosis not present

## 2022-06-16 MED ORDER — NEOMYCIN-POLYMYXIN-HC 1 % OT SOLN: OTIC | 1 refills | Status: DC

## 2022-06-16 NOTE — Progress Notes (Signed)
Subjective:  Patient ID: Kayla Deleon, female    DOB: 03/20/1951,  MRN: [id number] HPI Chief Complaint  Patient presents with   Toe Pain    Hallux right - lateral border, tender x couple weeks, tried soaking in epsom, daughter trimmed it some   New Patient (Initial Visit)    72 y.o. female presents with the above complaint.   ROS: Denies fever chills nausea vomit muscle aches pains calf pain back pain chest pain shortness of breath.  Past Medical History:  Diagnosis Date   Anxiety    Cervical spondylosis    evaluated in the past by neurossurgeon   Depression    Fibroids    in the past    Hypercholesteremia    Hypertension    Mental disorder    Sleep apnea    on CPAP   Past Surgical History:  Procedure Laterality Date   CESAREAN SECTION     CHOLECYSTECTOMY     LEFT HEART CATH AND CORONARY ANGIOGRAPHY N/A 12/03/2021   Procedure: LEFT HEART CATH AND CORONARY ANGIOGRAPHY;  Surgeon: Sherren Mocha, MD;  Location: [facility];  Service: Cardiovascular;  Laterality: N/A;    Current Outpatient Medications:    aspirin EC 81 MG tablet, Take 1 tablet (81 mg total) by mouth daily. Swallow whole., Disp: 30 tablet, Rfl: 11   augmented betamethasone dipropionate (DIPROLENE-AF) 0.05 % cream, Apply 1 Application topically 2 (two) times daily as needed for rash., Disp: , Rfl:    buPROPion (WELLBUTRIN SR) 200 MG 12 hr tablet, Take 200 mg by mouth daily., Disp: , Rfl:    carvedilol (COREG) 6.25 MG tablet, TAKE 1 TABLET BY MOUTH 2 TIMES DAILY WITH A MEAL., Disp: 180 tablet, Rfl: 1   EPINEPHrine 0.3 mg/0.3 mL IJ SOAJ injection, Inject 0.3 mg into the muscle daily as needed for anaphylaxis., Disp: , Rfl:    escitalopram (LEXAPRO) 20 MG tablet, Take 1 tablet (20 mg total) by mouth daily. For depression and anxiety., Disp: 30 tablet, Rfl: 0   ezetimibe (ZETIA) 10 MG tablet, Take 10 mg by mouth daily., Disp: , Rfl:    famotidine (PEPCID) 40 MG tablet, Take 40 mg by mouth as needed for  heartburn or indigestion., Disp: , Rfl:    isosorbide mononitrate (IMDUR) 30 MG 24 hr tablet, TAKE 1 TABLET BY MOUTH EVERY DAY, Disp: 90 tablet, Rfl: 1   isosorbide mononitrate (IMDUR) 60 MG 24 hr tablet, TAKE 1 TABLET BY MOUTH EVERY DAY, Disp: 90 tablet, Rfl: 1   lamoTRIgine (LAMICTAL) 25 MG tablet, Take 50 mg by mouth daily., Disp: , Rfl:    losartan (COZAAR) 50 MG tablet, TAKE 1 TABLET BY MOUTH EVERYDAY AT BEDTIME, Disp: 90 tablet, Rfl: 1   Multiple Vitamin (MULTIVITAMIN) tablet, Take 1 tablet by mouth daily., Disp: , Rfl:    nitroGLYCERIN (NITROSTAT) 0.4 MG SL tablet, Place 1 tablet (0.4 mg total) under the tongue every 5 (five) minutes as needed for chest pain (up to 3 doses. If taking 3rd dose call 911)., Disp: 25 tablet, Rfl: 3   Omega-3 Fatty Acids (FISH OIL PO), Take 2 capsules by mouth daily., Disp: , Rfl:    omeprazole (PRILOSEC) 40 MG capsule, Take 40 mg by mouth daily as needed (acid reflux)., Disp: , Rfl:    Probiotic Product (PROBIOTIC PO), Take 1 capsule by mouth daily., Disp: , Rfl:    REPATHA SURECLICK 025 MG/ML SOAJ, INJECT 1 DOSE INTO THE SKIN EVERY 14 (FOURTEEN) DAYS., Disp: 6 mL,  Rfl: 3   spironolactone (ALDACTONE) 25 MG tablet, TAKE 1 TABLET (25 MG TOTAL) BY MOUTH DAILY., Disp: 90 tablet, Rfl: 1  Allergies  Allergen Reactions   Shellfish Allergy Anaphylaxis and Rash    Pt. Uses Epipen. Pt. Uses Epipen.    Fentanyl Other (See Comments)    Confusion, sweating   Percocet [Oxycodone-Acetaminophen] Other (See Comments)    Confusion, AMS   Statins Other (See Comments)    Severe muscle pain   Review of Systems Objective:   Vitals:   06/16/22 0852  BP: (!) 106/58  Pulse: 66    General: Well developed, nourished, in no acute distress, alert and oriented x3   Dermatological: Skin is warm, dry and supple bilateral. Nails x 10 are well maintained; remaining integument appears unremarkable at this time. There are no open sores, no preulcerative lesions, no rash or signs  of infection present.  Sharp incurvated nail margin along the fibular border the hallux right.  There is no purulence no malodor tender on palpation.  No abscess visible.  Vascular: Dorsalis Pedis artery and Posterior Tibial artery pedal pulses are 2/4 bilateral with immedate capillary fill time. Pedal hair growth present. No varicosities and no lower extremity edema present bilateral.   Neruologic: Grossly intact via light touch bilateral. Vibratory intact via tuning fork bilateral. Protective threshold with Semmes Wienstein monofilament intact to all pedal sites bilateral. Patellar and Achilles deep tendon reflexes 2+ bilateral. No Babinski or clonus noted bilateral.   Musculoskeletal: No gross boney pedal deformities bilateral. No pain, crepitus, or limitation noted with foot and ankle range of motion bilateral. Muscular strength 5/5 in all groups tested bilateral.  Gait: Unassisted, Nonantalgic.    Radiographs:  None taken  Assessment & Plan:   Assessment: Ingrown toenail fibular border hallux right  Plan: Discussed etiology pathology conservative surgical therapies at this point a chemical matricectomy was performed after local anesthetic was administered.  She tolerated procedure well without complications.  Was given both oral and written with instructions for care and soaking of the toe as well as a prescription for Cortisporin Otic to be applied twice daily after soaking.  I would like to follow-up with her in about 2 weeks.     Kayleah Appleyard T. [place], [place]

## 2022-06-16 NOTE — Patient Instructions (Signed)

## 2022-06-24 DIAGNOSIS — G4733 Obstructive sleep apnea (adult) (pediatric): Secondary | ICD-10-CM | POA: Diagnosis not present

## 2022-06-26 DIAGNOSIS — M791 Myalgia, unspecified site: Secondary | ICD-10-CM | POA: Diagnosis not present

## 2022-06-26 DIAGNOSIS — E785 Hyperlipidemia, unspecified: Secondary | ICD-10-CM | POA: Diagnosis not present

## 2022-06-26 DIAGNOSIS — I2510 Atherosclerotic heart disease of native coronary artery without angina pectoris: Secondary | ICD-10-CM | POA: Diagnosis not present

## 2022-06-26 DIAGNOSIS — T466X5A Adverse effect of antihyperlipidemic and antiarteriosclerotic drugs, initial encounter: Secondary | ICD-10-CM | POA: Diagnosis not present

## 2022-06-27 LAB — NMR, LIPOPROFILE
Cholesterol, Total: 133 mg/dL (ref 100–199)
HDL Particle Number: 36.1 umol/L (ref >=30.5)
HDL-C: 47 mg/dL (ref >39)
LDL Particle Number: 661 nmol/L (ref <1000)
LDL Size: 20.4 nm — ABNORMAL LOW (ref >20.5)
LDL-C (NIH Calc): 59 mg/dL (ref 0–99)
LP-IR Score: 51 — ABNORMAL HIGH (ref <=45)
Small LDL Particle Number: 249 nmol/L (ref <=527)
Triglycerides: 161 mg/dL — ABNORMAL HIGH (ref 0–149)

## 2022-06-27 LAB — LIPOPROTEIN A (LPA): Lipoprotein (a): 276.9 nmol/L — ABNORMAL HIGH (ref <75.0)

## 2022-06-30 ENCOUNTER — Ambulatory Visit: Payer: PPO | Admitting: Podiatry

## 2022-06-30 ENCOUNTER — Encounter: Payer: Self-pay | Admitting: Podiatry

## 2022-06-30 DIAGNOSIS — Z9889 Other specified postprocedural states: Secondary | ICD-10-CM

## 2022-06-30 DIAGNOSIS — L600 Ingrowing nail: Secondary | ICD-10-CM

## 2022-06-30 NOTE — Progress Notes (Signed)
She presents today for follow-up of her nail check.  Her matrixectomy has gone very well.  She states that she continues to soak in Betadine and warm water up until yesterday.  Objective: Vital signs are stable alert oriented x 3.  There is no erythema edema salines drainage odor margins appear to be healing very nicely.  No signs of infection.  Assessment: Well-healing surgical toe.  Plan: Follow-up with me on an as-needed basis.

## 2022-07-06 ENCOUNTER — Encounter: Payer: Self-pay | Admitting: Internal Medicine

## 2022-07-07 MED ORDER — EZETIMIBE 10 MG PO TABS: 10.0000 mg | ORAL_TABLET | Freq: Every day | ORAL | 3 refills | Status: DC

## 2022-07-23 DIAGNOSIS — G4733 Obstructive sleep apnea (adult) (pediatric): Secondary | ICD-10-CM | POA: Diagnosis not present

## 2022-08-10 DIAGNOSIS — H35373 Puckering of macula, bilateral: Secondary | ICD-10-CM | POA: Diagnosis not present

## 2022-08-10 DIAGNOSIS — H524 Presbyopia: Secondary | ICD-10-CM | POA: Diagnosis not present

## 2022-08-10 DIAGNOSIS — H5213 Myopia, bilateral: Secondary | ICD-10-CM | POA: Diagnosis not present

## 2022-08-10 DIAGNOSIS — H04123 Dry eye syndrome of bilateral lacrimal glands: Secondary | ICD-10-CM | POA: Diagnosis not present

## 2022-08-10 DIAGNOSIS — H2513 Age-related nuclear cataract, bilateral: Secondary | ICD-10-CM | POA: Diagnosis not present

## 2022-08-10 DIAGNOSIS — H43811 Vitreous degeneration, right eye: Secondary | ICD-10-CM | POA: Diagnosis not present

## 2022-08-10 DIAGNOSIS — H52223 Regular astigmatism, bilateral: Secondary | ICD-10-CM | POA: Diagnosis not present

## 2022-08-19 DIAGNOSIS — R7303 Prediabetes: Secondary | ICD-10-CM | POA: Diagnosis not present

## 2022-08-19 DIAGNOSIS — K219 Gastro-esophageal reflux disease without esophagitis: Secondary | ICD-10-CM | POA: Diagnosis not present

## 2022-08-19 DIAGNOSIS — G4730 Sleep apnea, unspecified: Secondary | ICD-10-CM | POA: Diagnosis not present

## 2022-08-19 DIAGNOSIS — Z0000 Encounter for general adult medical examination without abnormal findings: Secondary | ICD-10-CM | POA: Diagnosis not present

## 2022-08-19 DIAGNOSIS — F339 Major depressive disorder, recurrent, unspecified: Secondary | ICD-10-CM | POA: Diagnosis not present

## 2022-08-19 DIAGNOSIS — Z6824 Body mass index (BMI) 24.0-24.9, adult: Secondary | ICD-10-CM | POA: Diagnosis not present

## 2022-08-19 DIAGNOSIS — Z23 Encounter for immunization: Secondary | ICD-10-CM | POA: Diagnosis not present

## 2022-08-19 DIAGNOSIS — E782 Mixed hyperlipidemia: Secondary | ICD-10-CM | POA: Diagnosis not present

## 2022-08-19 DIAGNOSIS — I10 Essential (primary) hypertension: Secondary | ICD-10-CM | POA: Diagnosis not present

## 2022-08-19 DIAGNOSIS — E559 Vitamin D deficiency, unspecified: Secondary | ICD-10-CM | POA: Diagnosis not present

## 2022-08-19 DIAGNOSIS — G4700 Insomnia, unspecified: Secondary | ICD-10-CM | POA: Diagnosis not present

## 2022-08-23 DIAGNOSIS — G4733 Obstructive sleep apnea (adult) (pediatric): Secondary | ICD-10-CM | POA: Diagnosis not present

## 2022-09-01 DIAGNOSIS — F332 Major depressive disorder, recurrent severe without psychotic features: Secondary | ICD-10-CM | POA: Diagnosis not present

## 2022-09-17 IMAGING — DX DG CHEST 1V PORT
1 series · 1 of 1 positions shown · non-contrast
Comparison: Radiograph 10/23/2019

CLINICAL DATA: Pain.  Recurrent bilateral chest pain.

EXAM:
PORTABLE CHEST 1 VIEW

[chest]
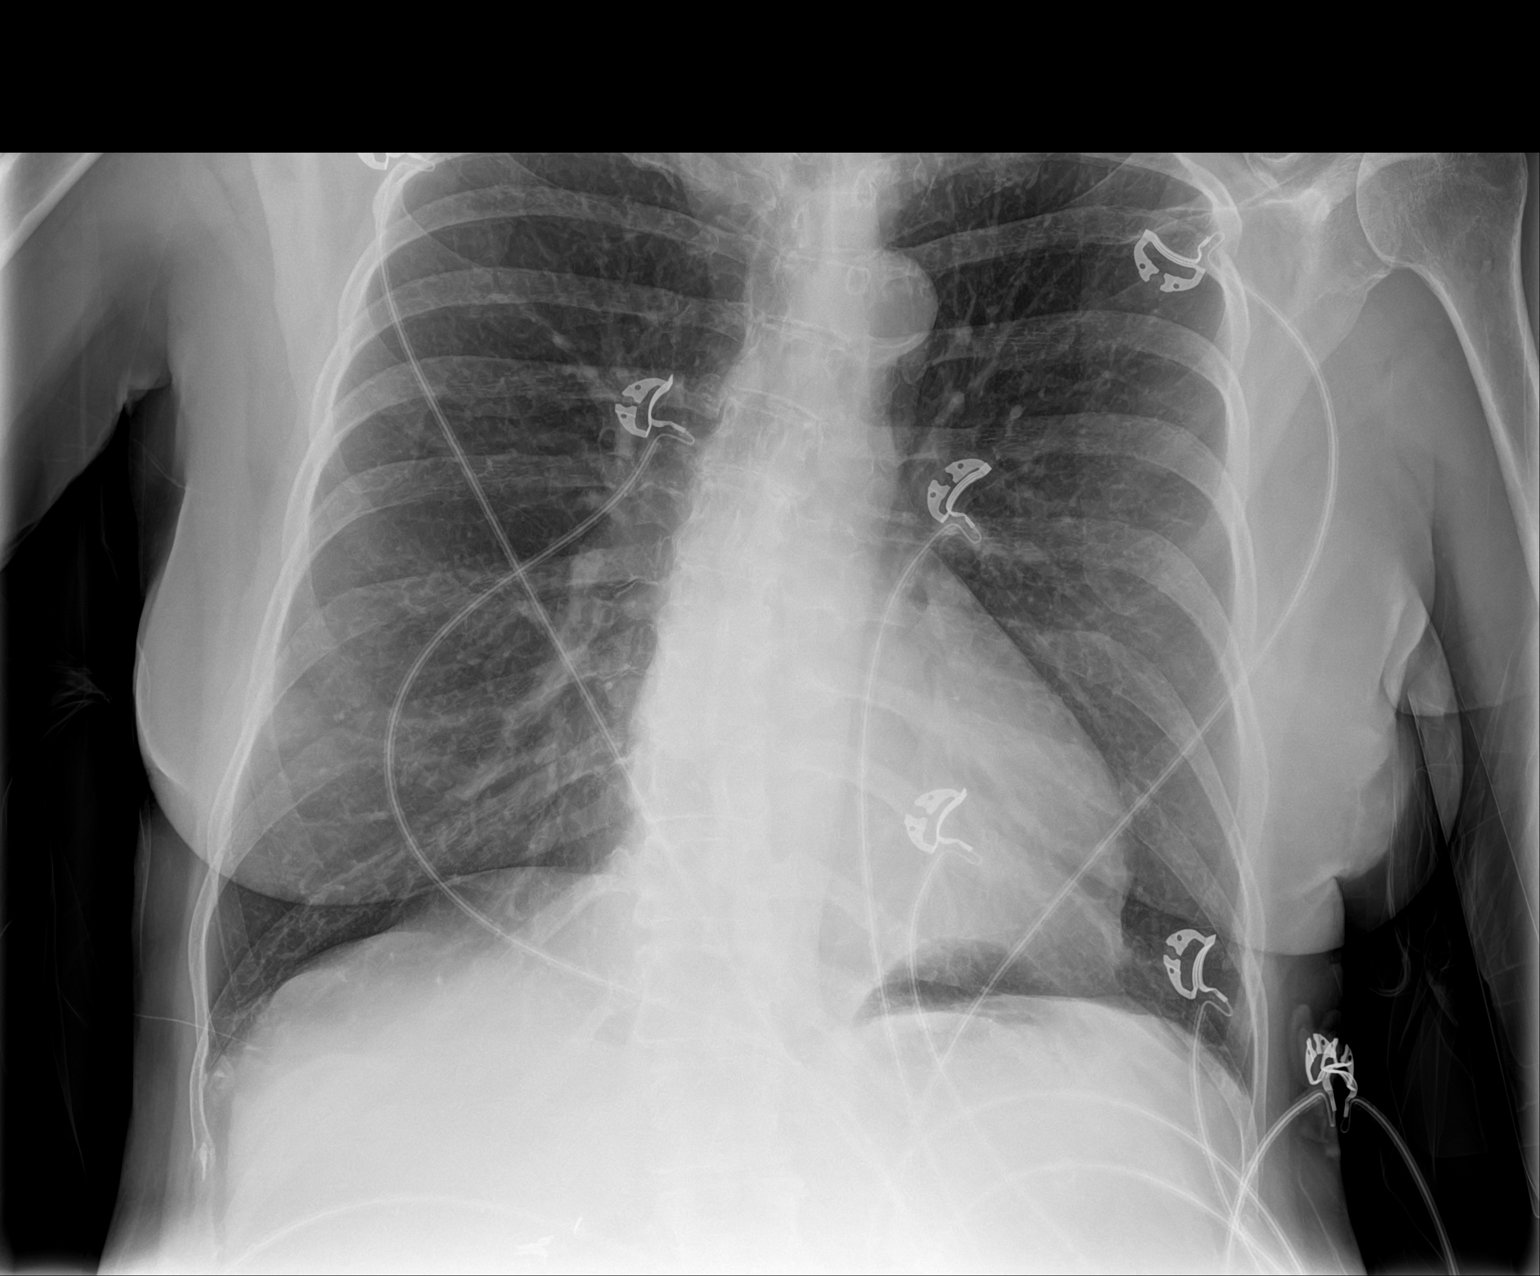

[1 of 1 positions shown; findings below may reference images not displayed]

FINDINGS: The cardiomediastinal contours are normal. Atherosclerosis of the
aortic arch. Pulmonary vasculature is normal. No consolidation,
pleural effusion, or pneumothorax. No acute osseous abnormalities
are seen.
IMPRESSION: No acute chest findings.

## 2022-09-22 DIAGNOSIS — G4733 Obstructive sleep apnea (adult) (pediatric): Secondary | ICD-10-CM | POA: Diagnosis not present

## 2022-09-24 ENCOUNTER — Other Ambulatory Visit: Payer: Self-pay | Admitting: Internal Medicine

## 2022-10-14 ENCOUNTER — Telehealth: Payer: Self-pay | Admitting: *Deleted

## 2022-10-14 NOTE — Telephone Encounter (Signed)
   Name: Kayla Deleon  DOB: 1950-09-08  MRN: [id number]  Primary Cardiologist: Chrystie Nose, MD   Preoperative team, please contact this patient and set up a phone call appointment for further preoperative risk assessment. Please obtain consent and complete medication review. Thank you for your help.  I confirm that guidance regarding antiplatelet and oral anticoagulation therapy has been completed and, if necessary, noted below.  Regarding ASA therapy, we recommend continuation of ASA throughout the perioperative period.  However, if the surgeon feels that cessation of ASA is required in the perioperative period, it may be stopped 5-7 days prior to surgery with a plan to resume it as soon as felt to be feasible from a surgical standpoint in the post-operative period.    Napoleon Form, Leodis Rains, NP 10/14/2022, 10:17 AM [facility]

## 2022-10-14 NOTE — Telephone Encounter (Signed)
   Pre-operative Risk Assessment    Patient Name: Kayla Deleon  DOB: 1950/12/23 MRN: [id number]     Request for Surgical Clearance    Procedure:  Dental Extraction - Amount of Teeth to be Pulled:  1 TOOTH FOR SURGICAL EXTRACTION    Date of Surgery:  Clearance TBD                                 Surgeon:  DR. REHM Surgeon's Group or Practice Name: [facility] Phone number:  [phone] Fax number:  [phone]   Type of Clearance Requested:   - Medical ; ASA    Type of Anesthesia:  Local ; CLEARANCE IS ALSO NOTED: IN CASE WE ARE UNABLE TO GET Kayla Deleon NUMB ENOUGH FOR EXTRACTION, WE WILL HAVE TO SWITCH HER TO IV SEDATION. THE MEDICATIONS THAT ARE TYPICALLY USED DURING SEDATION ARE: VERSED, ATROPINE, FENTANYL, DECADRON AND SODIUM BREVITAL   Additional requests/questions:    Kayla Deleon   10/14/2022, 9:46 AM

## 2022-10-14 NOTE — Telephone Encounter (Signed)
I tried to call pt x 2 and each time the call is dropped or lost.

## 2022-10-15 IMAGING — MG MM DIGITAL SCREENING BILAT W/ TOMO AND CAD
8 series · 9 of 24 positions shown · non-contrast
Comparison: Previous exam(s).

CLINICAL DATA: Screening.

EXAM:
DIGITAL SCREENING BILATERAL MAMMOGRAM WITH TOMOSYNTHESIS AND CAD
TECHNIQUE: Bilateral screening digital craniocaudal and mediolateral oblique
mammograms were obtained. Bilateral screening digital breast
tomosynthesis was performed. The images were evaluated with
computer-aided detection.

[L MLO synth-2D]
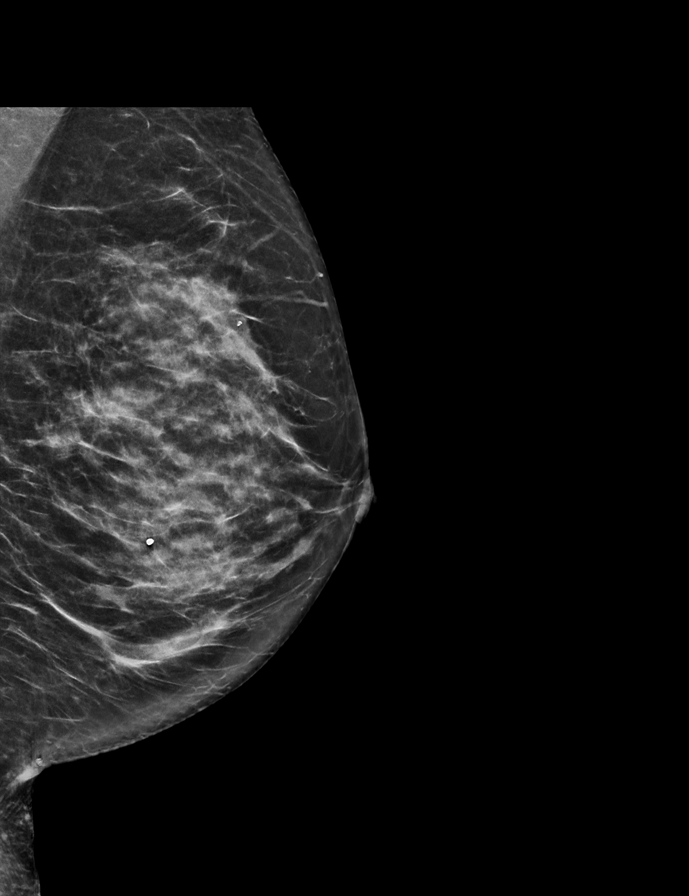

[R MLO synth-2D]
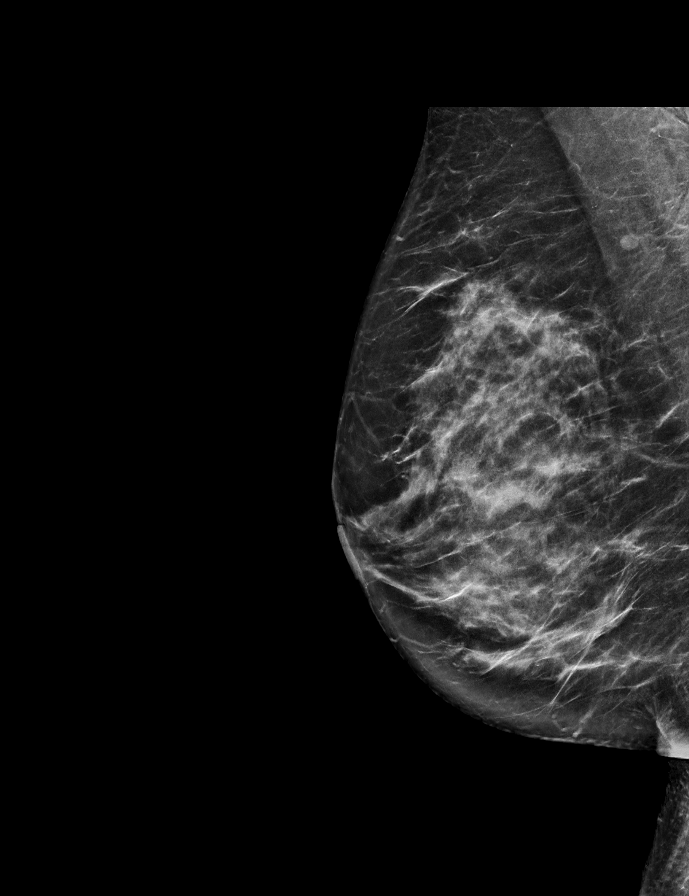

[L CC synth-2D]
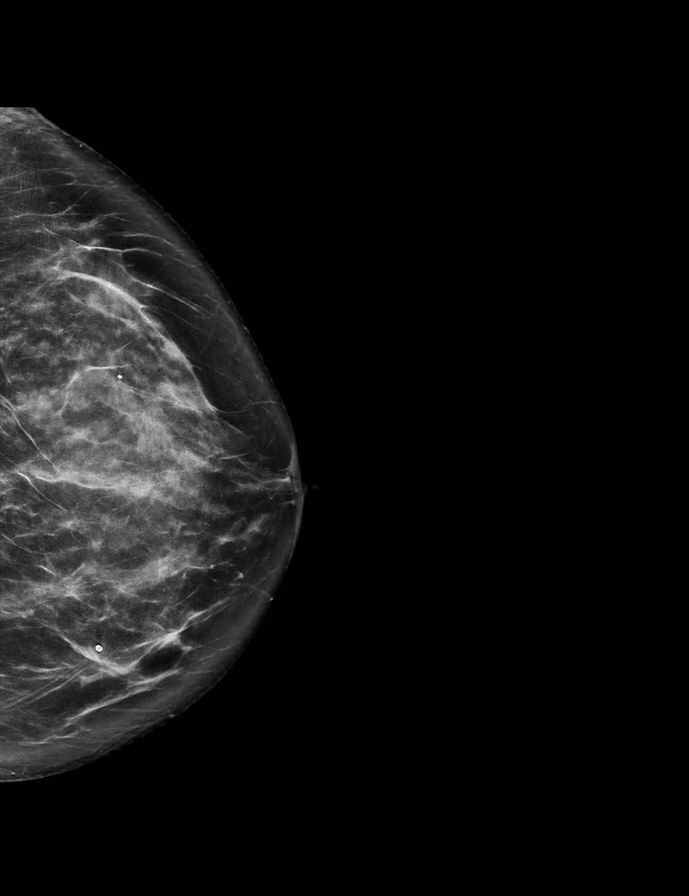

[R CC synth-2D]
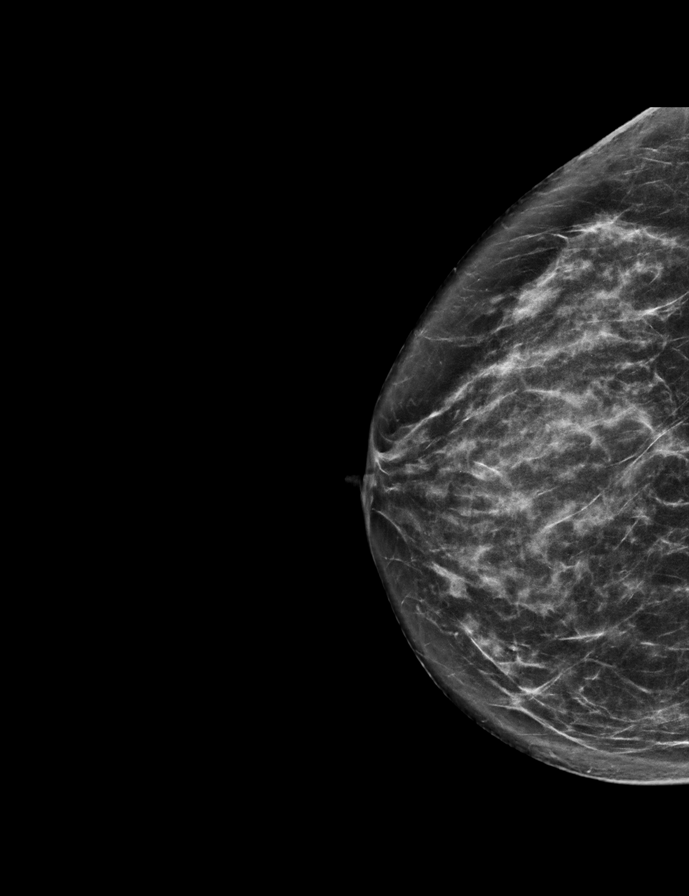

[R MLO tomo · 2 of 69 frames shown]
[frame 23/69]
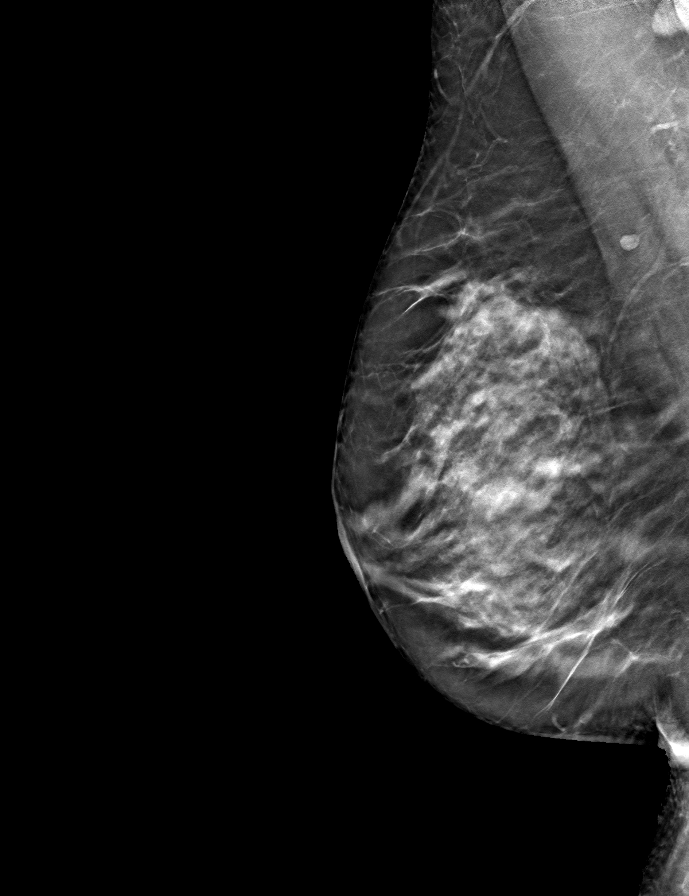
[frame 35/69]
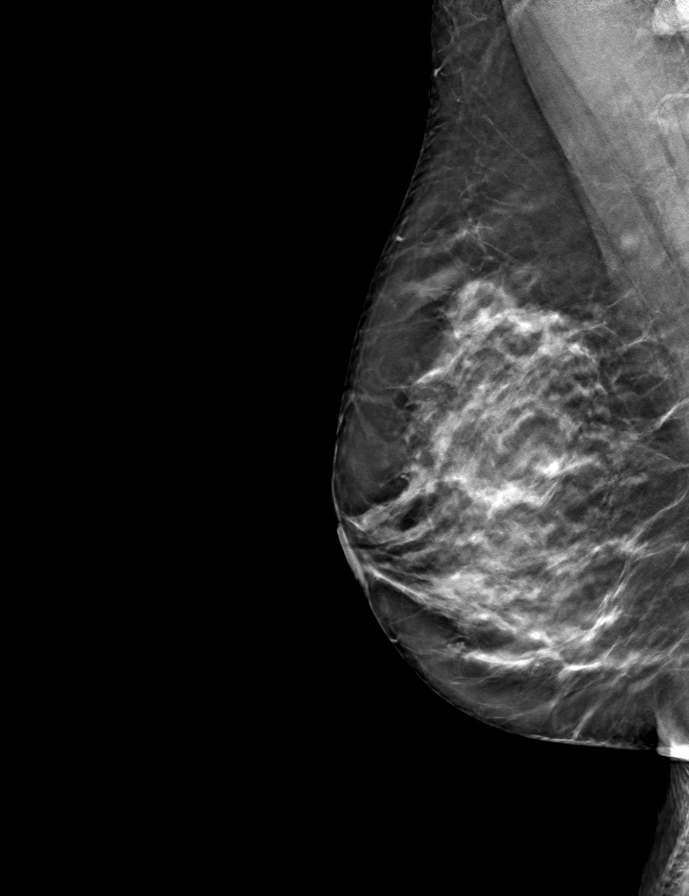

[L MLO tomo · tomo slice 31/62.0]
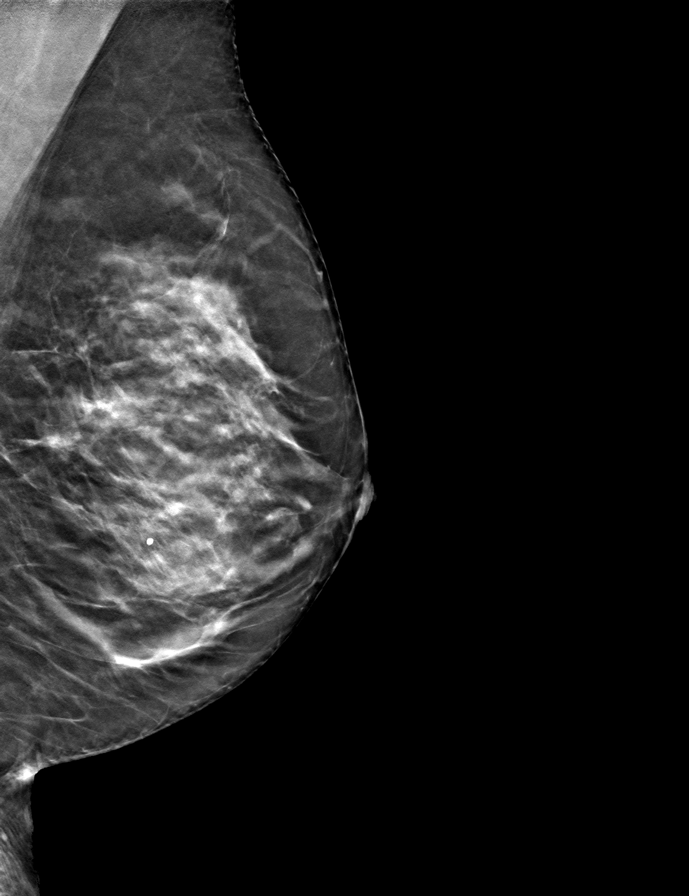

[L CC tomo · tomo slice 35/70.0]
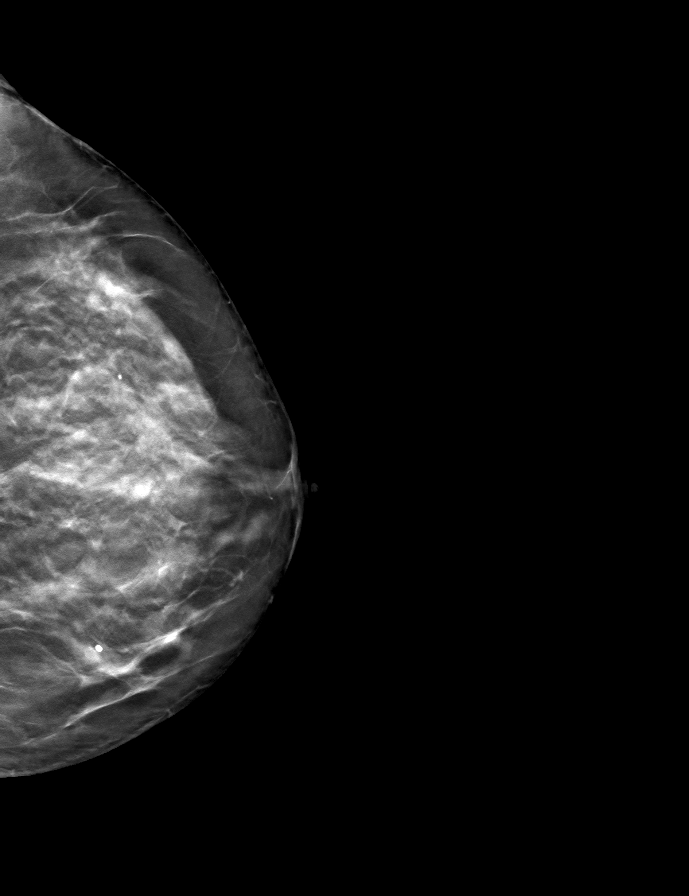

[R CC tomo · tomo slice 33/66.0]
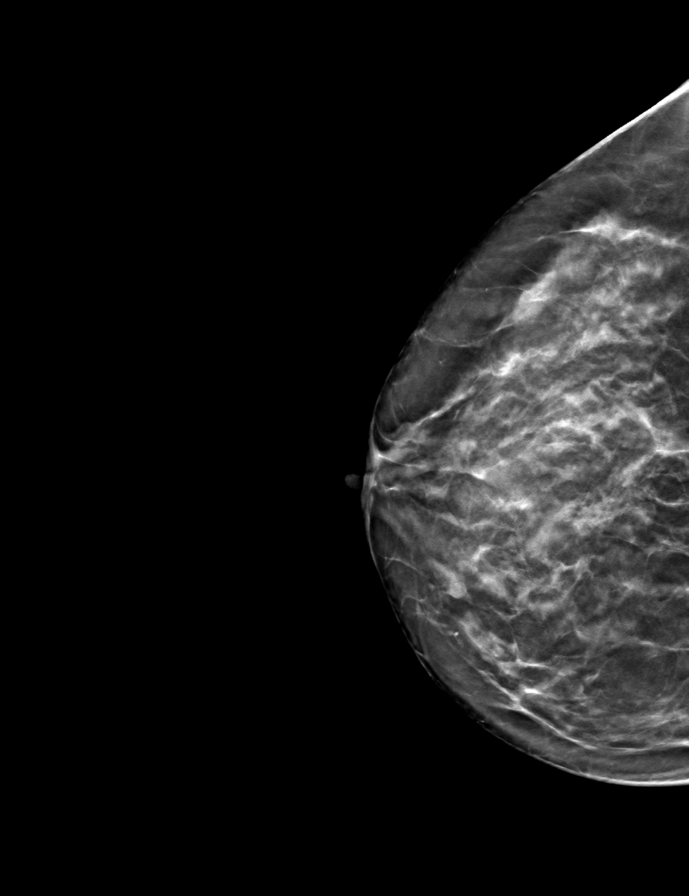

[9 of 24 positions shown; findings below may reference images not displayed]

ACR Breast Density Category c: The breast tissue is heterogeneously
dense, which may obscure small masses.
FINDINGS: There are no findings suspicious for malignancy. The images were
evaluated with computer-aided detection.
IMPRESSION: No mammographic evidence of malignancy. A result letter of this
screening mammogram will be mailed directly to the patient.

RECOMMENDATION:
Screening mammogram in one year. (Code:T4-5-GWO)

BI-RADS CATEGORY  1: Negative.

## 2022-10-15 NOTE — Telephone Encounter (Signed)
I tried to call the pt today and same thing happened as yesterday. Someone picks up and phone is disconnected. I will update the requesting office.

## 2022-10-16 ENCOUNTER — Encounter: Payer: Self-pay | Admitting: Internal Medicine

## 2022-10-19 ENCOUNTER — Telehealth: Payer: Self-pay

## 2022-10-19 NOTE — Telephone Encounter (Signed)
  Patient Consent for Virtual Visit         Kayla Deleon has provided verbal consent on 10/19/2022 for a virtual visit (video or telephone).   CONSENT FOR VIRTUAL VISIT FOR:  Kayla Deleon  By participating in this virtual visit I agree to the following:  I hereby voluntarily request, consent and authorize [facility] and its employed or contracted physicians, physician assistants, nurse practitioners or other licensed health care professionals (the Practitioner), to provide me with telemedicine health care services (the "Services") as deemed necessary by the treating Practitioner. I acknowledge and consent to receive the Services by the Practitioner via telemedicine. I understand that the telemedicine visit will involve communicating with the Practitioner through live audiovisual communication technology and the disclosure of certain medical information by electronic transmission. I acknowledge that I have been given the opportunity to request an in-person assessment or other available alternative prior to the telemedicine visit and am voluntarily participating in the telemedicine visit.  I understand that I have the right to withhold or withdraw my consent to the use of telemedicine in the course of my care at any time, without affecting my right to future care or treatment, and that the Practitioner or I may terminate the telemedicine visit at any time. I understand that I have the right to inspect all information obtained and/or recorded in the course of the telemedicine visit and may receive copies of available information for a reasonable fee.  I understand that some of the potential risks of receiving the Services via telemedicine include:  Delay or interruption in medical evaluation due to technological equipment failure or disruption; Information transmitted may not be sufficient (e.g. poor resolution of images) to allow for appropriate medical decision making by the Practitioner;  and/or  In rare instances, security protocols could fail, causing a breach of personal health information.  Furthermore, I acknowledge that it is my responsibility to provide information about my medical history, conditions and care that is complete and accurate to the best of my ability. I acknowledge that Practitioner's advice, recommendations, and/or decision may be based on factors not within their control, such as incomplete or inaccurate data provided by me or distortions of diagnostic images or specimens that may result from electronic transmissions. I understand that the practice of medicine is not an exact science and that Practitioner makes no warranties or guarantees regarding treatment outcomes. I acknowledge that a copy of this consent can be made available to me via my patient portal [facility] MyChart), or I can request a printed copy by calling the office of [facility].    I understand that my insurance will be billed for this visit.   I have read or had this consent read to me. I understand the contents of this consent, which adequately explains the benefits and risks of the Services being provided via telemedicine.  I have been provided ample opportunity to ask questions regarding this consent and the Services and have had my questions answered to my satisfaction. I give my informed consent for the services to be provided through the use of telemedicine in my medical care

## 2022-10-19 NOTE — Telephone Encounter (Signed)
Patient scheduled for tele visit on 6/12 for preop clearance. Med rec and consent done

## 2022-10-23 DIAGNOSIS — G4733 Obstructive sleep apnea (adult) (pediatric): Secondary | ICD-10-CM | POA: Diagnosis not present

## 2022-10-27 NOTE — Progress Notes (Unsigned)
Virtual Visit via Telephone Note   Because of Kayla Deleon's co-morbid illnesses, she is at least at moderate risk for complications without adequate follow up.  This format is felt to be most appropriate for this patient at this time.  The patient did not have access to video technology/had technical difficulties with video requiring transitioning to audio format only (telephone).  All issues noted in this document were discussed and addressed.  No physical exam could be performed with this format.  Please refer to the patient's chart for her consent to telehealth for [facility].  Evaluation Performed:  Preoperative cardiovascular risk assessment _____________   Date:  10/28/2022   Patient ID:  Kayla Deleon, DOB Dec 05, 1950, MRN [id number] Patient Location:  Home Provider location:   Office  Primary Care Provider:  Catha Gosselin, MD Primary Cardiologist:  Chrystie Nose, MD  Chief Complaint / Patient Profile   72 y.o. y/o female with a h/o dyslipidemia, nonobstructive CAD on cath 12/03/2021, stress-induced cardiomyopathy with improved EF, HTN, prediabetes, OSA on CPAP who is pending surgical dental extraction of 1 tooth  and presents today for telephonic preoperative cardiovascular risk assessment.  History of Present Illness    Kayla Deleon is a 72 y.o. female who presents via audio/video conferencing for a telehealth visit today.  Pt was last seen in cardiology clinic on 06/11/22 by Dr. Rennis Golden.  At that time Kayla Deleon was doing well.  The patient is now pending procedure as outlined above. Since her last visit, she denies chest pain, shortness of breath, lower extremity edema, fatigue, palpitations, melena, hematuria, hemoptysis, diaphoresis, weakness, presyncope, syncope, orthopnea, and PND. She participates in Zumba 4 days per week as well as completing house work with no concerning cardiac symptoms.    Past Medical History    Past Medical History:  Diagnosis  Date   Anxiety    Cervical spondylosis    evaluated in the past by neurossurgeon   Depression    Fibroids    in the past    Hypercholesteremia    Hypertension    Mental disorder    Sleep apnea    on CPAP   Past Surgical History:  Procedure Laterality Date   CESAREAN SECTION     CHOLECYSTECTOMY     LEFT HEART CATH AND CORONARY ANGIOGRAPHY N/A 12/03/2021   Procedure: LEFT HEART CATH AND CORONARY ANGIOGRAPHY;  Surgeon: Tonny Bollman, MD;  Location: [facility] INVASIVE CV LAB;  Service: Cardiovascular;  Laterality: N/A;    Allergies  Allergies  Allergen Reactions   Shellfish Allergy Anaphylaxis and Rash    Pt. Uses Epipen. Pt. Uses Epipen.    Fentanyl Other (See Comments)    Confusion, sweating   Percocet [Oxycodone-Acetaminophen] Other (See Comments)    Confusion, AMS   Statins Other (See Comments)    Severe muscle pain    Home Medications    Prior to Admission medications   Medication Sig Start Date End Date Taking? Authorizing Provider  aspirin EC 81 MG tablet Take 1 tablet (81 mg total) by mouth daily. Swallow whole. 12/07/21   Nahser, Deloris Ping, MD  augmented betamethasone dipropionate (DIPROLENE-AF) 0.05 % cream Apply 1 Application topically 2 (two) times daily as needed for rash. 11/17/21   [provider]  buPROPion (WELLBUTRIN SR) 200 MG 12 hr tablet Take 200 mg by mouth daily. 10/19/15   [provider]  carvedilol (COREG) 6.25 MG tablet TAKE 1 TABLET BY MOUTH 2 TIMES DAILY WITH  A MEAL. 05/29/22   Nahser, Deloris Ping, MD  EPINEPHrine 0.3 mg/0.3 mL IJ SOAJ injection Inject 0.3 mg into the muscle daily as needed for anaphylaxis.    [provider]  escitalopram (LEXAPRO) 20 MG tablet Take 1 tablet (20 mg total) by mouth daily. For depression and anxiety. 08/28/11   Viviann Spare, FNP  ezetimibe (ZETIA) 10 MG tablet Take 1 tablet (10 mg total) by mouth daily. 07/07/22   Chrystie Nose, MD  famotidine (PEPCID) 40 MG tablet Take 40 mg by mouth as needed  for heartburn or indigestion.    [provider]  isosorbide mononitrate (IMDUR) 60 MG 24 hr tablet TAKE 1 TABLET BY MOUTH EVERY DAY 09/24/22   Hilty, Lisette Abu, MD  lamoTRIgine (LAMICTAL) 25 MG tablet Take 50 mg by mouth daily.    [provider]  losartan (COZAAR) 50 MG tablet TAKE 1 TABLET BY MOUTH EVERYDAY AT BEDTIME 05/29/22   Nahser, Deloris Ping, MD  Multiple Vitamin (MULTIVITAMIN) tablet Take 1 tablet by mouth daily.    [provider]  NEOMYCIN-POLYMYXIN-HYDROCORTISONE (CORTISPORIN) 1 % SOLN OTIC solution Apply 1-2 drops to toe BID after soaking 06/16/22   Hyatt, Max T, DPM  nitroGLYCERIN (NITROSTAT) 0.4 MG SL tablet Place 1 tablet (0.4 mg total) under the tongue every 5 (five) minutes as needed for chest pain (up to 3 doses. If taking 3rd dose call 911). 12/06/21   Dunn, Tacey Ruiz, PA-C  Omega-3 Fatty Acids (FISH OIL PO) Take 2 capsules by mouth daily.    [provider]  omeprazole (PRILOSEC) 40 MG capsule Take 40 mg by mouth daily as needed (acid reflux).    [provider]  Probiotic Product (PROBIOTIC PO) Take 1 capsule by mouth daily.    [provider]  REPATHA SURECLICK 140 MG/ML SOAJ INJECT 1 DOSE INTO THE SKIN EVERY 14 (FOURTEEN) DAYS. 03/04/22   Hilty, Lisette Abu, MD  spironolactone (ALDACTONE) 25 MG tablet TAKE 1 TABLET (25 MG TOTAL) BY MOUTH DAILY. 05/29/22   Nahser, Deloris Ping, MD    Physical Exam    Vital Signs:  Kayla Deleon does not have vital signs available for review today.  Given telephonic nature of communication, physical exam is limited. AAOx3. NAD. Normal affect.  Speech and respirations are unlabored.  Accessory Clinical Findings    None  Assessment & Plan    1.  Preoperative Cardiovascular Risk Assessment: According to the Revised Cardiac Risk Index (RCRI), her Perioperative Risk of Major Cardiac Event is (%): 0.9. Her Functional Capacity in METs is: 7.04 according to the Duke Activity Status Index (DASI). The  patient is doing well from a cardiac perspective. Therefore, based on ACC/AHA guidelines, the patient would be at acceptable risk for the planned procedure without further cardiovascular testing.   The patient was advised that if she develops new symptoms prior to surgery to contact our office to arrange for a follow-up visit, and she verbalized understanding.  Per office protocol, he may hold aspirin for 7 days prior to procedure and should resume as soon as hemodynamically stable postoperatively.   A copy of this note will be routed to requesting surgeon.  Time:   Today, I have spent 10 minutes with the patient with telehealth technology discussing medical history, symptoms, and management plan.    Levi Aland, NP-C  10/28/2022, 9:42 AM [address], [address] Office [phone] Fax [phone]

## 2022-10-28 ENCOUNTER — Encounter: Payer: Self-pay | Admitting: Nurse Practitioner

## 2022-10-28 ENCOUNTER — Ambulatory Visit: Payer: PPO | Attending: Cardiology | Admitting: Nurse Practitioner

## 2022-10-28 DIAGNOSIS — Z01810 Encounter for preprocedural cardiovascular examination: Secondary | ICD-10-CM | POA: Diagnosis not present

## 2022-11-21 ENCOUNTER — Other Ambulatory Visit: Payer: Self-pay | Admitting: Cardiovascular Disease

## 2022-11-22 DIAGNOSIS — G4733 Obstructive sleep apnea (adult) (pediatric): Secondary | ICD-10-CM | POA: Diagnosis not present

## 2022-12-08 DIAGNOSIS — G4733 Obstructive sleep apnea (adult) (pediatric): Secondary | ICD-10-CM | POA: Diagnosis not present

## 2022-12-23 DIAGNOSIS — G4733 Obstructive sleep apnea (adult) (pediatric): Secondary | ICD-10-CM | POA: Diagnosis not present

## 2022-12-28 ENCOUNTER — Other Ambulatory Visit: Payer: Self-pay | Admitting: Family Medicine

## 2022-12-28 DIAGNOSIS — Z0000 Encounter for general adult medical examination without abnormal findings: Secondary | ICD-10-CM

## 2022-12-31 ENCOUNTER — Ambulatory Visit: Payer: PPO

## 2023-01-13 ENCOUNTER — Ambulatory Visit: Payer: PPO

## 2023-01-13 DIAGNOSIS — R5383 Other fatigue: Secondary | ICD-10-CM | POA: Diagnosis not present

## 2023-01-13 DIAGNOSIS — Z6825 Body mass index (BMI) 25.0-25.9, adult: Secondary | ICD-10-CM | POA: Diagnosis not present

## 2023-01-13 DIAGNOSIS — R7303 Prediabetes: Secondary | ICD-10-CM | POA: Diagnosis not present

## 2023-01-13 DIAGNOSIS — G4700 Insomnia, unspecified: Secondary | ICD-10-CM | POA: Diagnosis not present

## 2023-01-13 DIAGNOSIS — N3281 Overactive bladder: Secondary | ICD-10-CM | POA: Diagnosis not present

## 2023-01-19 ENCOUNTER — Encounter: Payer: Self-pay | Admitting: Internal Medicine

## 2023-01-22 ENCOUNTER — Ambulatory Visit
Admission: RE | Admit: 2023-01-22 | Discharge: 2023-01-22 | Disposition: A | Discharge status: Home / Self Care | Payer: PPO | Source: Ambulatory Visit | Attending: Family Medicine | Admitting: Family Medicine

## 2023-01-22 DIAGNOSIS — Z1231 Encounter for screening mammogram for malignant neoplasm of breast: Secondary | ICD-10-CM | POA: Diagnosis not present

## 2023-01-22 DIAGNOSIS — Z0000 Encounter for general adult medical examination without abnormal findings: Secondary | ICD-10-CM

## 2023-01-27 ENCOUNTER — Other Ambulatory Visit: Payer: Self-pay | Admitting: Family Medicine

## 2023-01-27 DIAGNOSIS — R928 Other abnormal and inconclusive findings on diagnostic imaging of breast: Secondary | ICD-10-CM

## 2023-01-29 ENCOUNTER — Other Ambulatory Visit: Payer: Self-pay | Admitting: Internal Medicine

## 2023-02-02 ENCOUNTER — Ambulatory Visit: Payer: PPO

## 2023-02-02 ENCOUNTER — Ambulatory Visit
Admission: RE | Admit: 2023-02-02 | Discharge: 2023-02-02 | Disposition: A | Discharge status: Home / Self Care | Payer: PPO | Source: Ambulatory Visit | Attending: Family Medicine | Admitting: Family Medicine

## 2023-02-02 DIAGNOSIS — R928 Other abnormal and inconclusive findings on diagnostic imaging of breast: Secondary | ICD-10-CM | POA: Diagnosis not present

## 2023-02-03 ENCOUNTER — Encounter: Payer: Self-pay | Admitting: Internal Medicine

## 2023-02-04 ENCOUNTER — Other Ambulatory Visit (HOSPITAL_COMMUNITY): Payer: Self-pay

## 2023-02-14 DIAGNOSIS — R5383 Other fatigue: Secondary | ICD-10-CM | POA: Diagnosis not present

## 2023-02-14 DIAGNOSIS — U071 COVID-19: Secondary | ICD-10-CM | POA: Diagnosis not present

## 2023-02-14 DIAGNOSIS — R051 Acute cough: Secondary | ICD-10-CM | POA: Diagnosis not present

## 2023-02-24 DIAGNOSIS — R051 Acute cough: Secondary | ICD-10-CM | POA: Diagnosis not present

## 2023-02-26 DIAGNOSIS — G4733 Obstructive sleep apnea (adult) (pediatric): Secondary | ICD-10-CM | POA: Diagnosis not present

## 2023-02-26 DIAGNOSIS — G720 Drug-induced myopathy: Secondary | ICD-10-CM | POA: Diagnosis not present

## 2023-02-26 DIAGNOSIS — E7800 Pure hypercholesterolemia, unspecified: Secondary | ICD-10-CM | POA: Diagnosis not present

## 2023-02-26 DIAGNOSIS — F3340 Major depressive disorder, recurrent, in remission, unspecified: Secondary | ICD-10-CM | POA: Diagnosis not present

## 2023-02-26 DIAGNOSIS — Z6825 Body mass index (BMI) 25.0-25.9, adult: Secondary | ICD-10-CM | POA: Diagnosis not present

## 2023-02-26 DIAGNOSIS — N289 Disorder of kidney and ureter, unspecified: Secondary | ICD-10-CM | POA: Diagnosis not present

## 2023-02-26 DIAGNOSIS — R7303 Prediabetes: Secondary | ICD-10-CM | POA: Diagnosis not present

## 2023-02-26 DIAGNOSIS — I700 Atherosclerosis of aorta: Secondary | ICD-10-CM | POA: Diagnosis not present

## 2023-02-26 DIAGNOSIS — Z9989 Dependence on other enabling machines and devices: Secondary | ICD-10-CM | POA: Diagnosis not present

## 2023-02-26 DIAGNOSIS — I25119 Atherosclerotic heart disease of native coronary artery with unspecified angina pectoris: Secondary | ICD-10-CM | POA: Diagnosis not present

## 2023-03-02 DIAGNOSIS — E782 Mixed hyperlipidemia: Secondary | ICD-10-CM | POA: Diagnosis not present

## 2023-03-02 DIAGNOSIS — R7303 Prediabetes: Secondary | ICD-10-CM | POA: Diagnosis not present

## 2023-03-02 DIAGNOSIS — Z713 Dietary counseling and surveillance: Secondary | ICD-10-CM | POA: Diagnosis not present

## 2023-03-02 DIAGNOSIS — I10 Essential (primary) hypertension: Secondary | ICD-10-CM | POA: Diagnosis not present

## 2023-03-09 DIAGNOSIS — F332 Major depressive disorder, recurrent severe without psychotic features: Secondary | ICD-10-CM | POA: Diagnosis not present

## 2023-04-16 ENCOUNTER — Other Ambulatory Visit: Payer: Self-pay | Admitting: Internal Medicine

## 2023-04-16 ENCOUNTER — Other Ambulatory Visit: Payer: Self-pay | Admitting: Cardiovascular Disease

## 2023-04-16 ENCOUNTER — Encounter: Payer: Self-pay | Admitting: Internal Medicine

## 2023-04-19 MED ORDER — NITROGLYCERIN 0.4 MG SL SUBL: 0.4000 mg | SUBLINGUAL_TABLET | SUBLINGUAL | 3 refills | Status: AC | PRN

## 2023-04-23 ENCOUNTER — Other Ambulatory Visit: Payer: Self-pay

## 2023-04-23 ENCOUNTER — Emergency Department (HOSPITAL_BASED_OUTPATIENT_CLINIC_OR_DEPARTMENT_OTHER): Payer: PPO

## 2023-04-23 ENCOUNTER — Emergency Department (HOSPITAL_BASED_OUTPATIENT_CLINIC_OR_DEPARTMENT_OTHER)
Admission: EM | Admit: 2023-04-23 | Discharge: 2023-04-23 | Disposition: A | Discharge status: Home / Self Care | Payer: PPO

## 2023-04-23 DIAGNOSIS — R1013 Epigastric pain: Secondary | ICD-10-CM | POA: Diagnosis not present

## 2023-04-23 DIAGNOSIS — M25511 Pain in right shoulder: Secondary | ICD-10-CM | POA: Diagnosis not present

## 2023-04-23 DIAGNOSIS — I700 Atherosclerosis of aorta: Secondary | ICD-10-CM | POA: Diagnosis not present

## 2023-04-23 DIAGNOSIS — Z7982 Long term (current) use of aspirin: Secondary | ICD-10-CM | POA: Diagnosis not present

## 2023-04-23 DIAGNOSIS — R0689 Other abnormalities of breathing: Secondary | ICD-10-CM | POA: Insufficient documentation

## 2023-04-23 DIAGNOSIS — M25512 Pain in left shoulder: Secondary | ICD-10-CM | POA: Insufficient documentation

## 2023-04-23 DIAGNOSIS — R42 Dizziness and giddiness: Secondary | ICD-10-CM | POA: Diagnosis not present

## 2023-04-23 DIAGNOSIS — I517 Cardiomegaly: Secondary | ICD-10-CM | POA: Diagnosis not present

## 2023-04-23 DIAGNOSIS — R079 Chest pain, unspecified: Secondary | ICD-10-CM | POA: Diagnosis not present

## 2023-04-23 DIAGNOSIS — R0789 Other chest pain: Secondary | ICD-10-CM | POA: Diagnosis not present

## 2023-04-23 LAB — CBC
HCT: 36 % (ref 36.0–46.0)
Hemoglobin: 12.2 g/dL (ref 12.0–15.0)
MCH: 31.9 pg (ref 26.0–34.0)
MCHC: 33.9 g/dL (ref 30.0–36.0)
MCV: 94.2 fL (ref 80.0–100.0)
Platelets: 262 10*3/uL (ref 150–400)
RBC: 3.82 MIL/uL — ABNORMAL LOW (ref 3.87–5.11)
RDW: 12.3 % (ref 11.5–15.5)
WBC: 7.4 10*3/uL (ref 4.0–10.5)
nRBC: 0 % (ref 0.0–0.2)

## 2023-04-23 LAB — COMPREHENSIVE METABOLIC PANEL
ALT: 26 U/L (ref 0–44)
AST: 19 U/L (ref 15–41)
Albumin: 4.4 g/dL (ref 3.5–5.0)
Alkaline Phosphatase: 48 U/L (ref 38–126)
Anion gap: 10 (ref 5–15)
BUN: 25 mg/dL — ABNORMAL HIGH (ref 8–23)
CO2: 25 mmol/L (ref 22–32)
Calcium: 9.8 mg/dL (ref 8.9–10.3)
Chloride: 103 mmol/L (ref 98–111)
Creatinine, Ser: 1.09 mg/dL — ABNORMAL HIGH (ref 0.44–1.00)
GFR, Estimated: 55 mL/min — ABNORMAL LOW (ref >60)
Glucose, Bld: 109 mg/dL — ABNORMAL HIGH (ref 70–99)
Potassium: 4 mmol/L (ref 3.5–5.1)
Sodium: 138 mmol/L (ref 135–145)
Total Bilirubin: 0.3 mg/dL (ref <1.2)
Total Protein: 7.6 g/dL (ref 6.5–8.1)

## 2023-04-23 LAB — LIPASE, BLOOD: Lipase: 43 U/L (ref 11–51)

## 2023-04-23 LAB — TROPONIN I (HIGH SENSITIVITY)
Troponin I (High Sensitivity): 2 ng/L (ref <18)
Troponin I (High Sensitivity): 3 ng/L (ref <18)

## 2023-04-23 LAB — BRAIN NATRIURETIC PEPTIDE: B Natriuretic Peptide: 48.4 pg/mL (ref 0.0–100.0)

## 2023-04-23 MED ORDER — PANTOPRAZOLE SODIUM 20 MG PO TBEC: 20.0000 mg | DELAYED_RELEASE_TABLET | Freq: Every day | ORAL | 0 refills | Status: DC

## 2023-04-23 MED ORDER — ONDANSETRON HCL 4 MG PO TABS: 4.0000 mg | ORAL_TABLET | Freq: Four times a day (QID) | ORAL | 0 refills | Status: DC

## 2023-04-23 MED ORDER — ONDANSETRON 4 MG PO TBDP
4.0000 mg | ORAL_TABLET | Freq: Once | ORAL | Status: AC
  Administered 2023-04-23: 4 mg via ORAL
  Filled 2023-04-23: qty 1

## 2023-04-23 MED ORDER — ALUM & MAG HYDROXIDE-SIMETH 200-200-20 MG/5ML PO SUSP
30.0000 mL | Freq: Once | ORAL | Status: AC
  Administered 2023-04-23: 30 mL via ORAL
  Filled 2023-04-23: qty 30

## 2023-04-23 NOTE — ED Provider Notes (Signed)
[place] EMERGENCY DEPARTMENT AT [facility] Provider Note   CSN: [id number] Arrival date & time: 04/23/23  1547     History  Chief Complaint  Patient presents with   Nausea    Kayla Deleon is a 72 y.o. female.  This is a 72 year old female presenting emergency department with epigastric/chest pain.  Was sitting on couch after dinner this evening.  Developed lightheadedness, epigastric discomfort with nausea and vomiting.  Reports that she has had some bilateral shoulder pain, but noted some worsening pain in her left shoulder during this episode.  She reports history of Takotsubo cardiomyopathy.  Took nitro.  She states that felt similar to GERD/reflux which she has had worsening symptoms over the past several months.  That she has never been nauseated and vomited before.  Denies abdominal pain.  No dysuria.  No shortness of breath.  Current symptoms are seemingly resolved.        Home Medications Prior to Admission medications   Medication Sig Start Date End Date Taking? Authorizing Provider  ondansetron (ZOFRAN) 4 MG tablet Take 1 tablet (4 mg total) by mouth every 6 (six) hours. 04/23/23  Yes Coral Spikes, DO  pantoprazole (PROTONIX) 20 MG tablet Take 1 tablet (20 mg total) by mouth daily for 14 days. 04/23/23 05/07/23 Yes Tynleigh Birt, Harmon Dun, DO  aspirin EC ([employer] ASPIRIN LOW DOSE) 81 MG tablet TAKE 1 TABLET (81 MG TOTAL) BY MOUTH DAILY. SWALLOW WHOLE. 04/21/23   Azalee Course, PA  augmented betamethasone dipropionate (DIPROLENE-AF) 0.05 % cream Apply 1 Application topically 2 (two) times daily as needed for rash. 11/17/21   [provider]  buPROPion (WELLBUTRIN SR) 200 MG 12 hr tablet Take 200 mg by mouth daily. 10/19/15   [provider]  carvedilol (COREG) 6.25 MG tablet TAKE 1 TABLET BY MOUTH TWICE A DAY WITH FOOD 11/23/22   Nahser, Deloris Ping, MD  EPINEPHrine 0.3 mg/0.3 mL IJ SOAJ injection Inject 0.3 mg into the muscle daily as needed for anaphylaxis.     [provider]  escitalopram (LEXAPRO) 20 MG tablet Take 1 tablet (20 mg total) by mouth daily. For depression and anxiety. 08/28/11   Viviann Spare, FNP  Evolocumab (REPATHA SURECLICK) 140 MG/ML SOAJ INJECT 1 DOSE INTO THE SKIN EVERY 14 (FOURTEEN) DAYS. 01/29/23   Hilty, Lisette Abu, MD  ezetimibe (ZETIA) 10 MG tablet TAKE 1 TABLET BY MOUTH EVERY DAY 04/20/23   Hilty, Lisette Abu, MD  famotidine (PEPCID) 40 MG tablet Take 40 mg by mouth as needed for heartburn or indigestion.    [provider]  isosorbide mononitrate (IMDUR) 60 MG 24 hr tablet TAKE 1 TABLET BY MOUTH EVERY DAY 09/24/22   Hilty, Lisette Abu, MD  lamoTRIgine (LAMICTAL) 25 MG tablet Take 50 mg by mouth daily.    [provider]  losartan (COZAAR) 50 MG tablet TAKE 1 TABLET BY MOUTH EVERYDAY AT BEDTIME 11/23/22   Nahser, Deloris Ping, MD  Multiple Vitamin (MULTIVITAMIN) tablet Take 1 tablet by mouth daily.    [provider]  NEOMYCIN-POLYMYXIN-HYDROCORTISONE (CORTISPORIN) 1 % SOLN OTIC solution Apply 1-2 drops to toe BID after soaking 06/16/22   Hyatt, Max T, DPM  nitroGLYCERIN (NITROSTAT) 0.4 MG SL tablet Place 1 tablet (0.4 mg total) under the tongue every 5 (five) minutes as needed for chest pain (up to 3 doses. If taking 3rd dose call 911). 04/19/23   Hilty, Lisette Abu, MD  Omega-3 Fatty Acids (FISH OIL PO) Take 2 capsules by  mouth daily.    [provider]  omeprazole (PRILOSEC) 40 MG capsule Take 40 mg by mouth daily as needed (acid reflux).    [provider]  Probiotic Product (PROBIOTIC PO) Take 1 capsule by mouth daily.    [provider]  spironolactone (ALDACTONE) 25 MG tablet TAKE 1 TABLET (25 MG TOTAL) BY MOUTH DAILY. 11/23/22   Nahser, Deloris Ping, MD      Allergies    Shellfish allergy, Fentanyl, Percocet [oxycodone-acetaminophen], and Statins    Review of Systems   Review of Systems  Physical Exam Updated Vital Signs BP 120/69   Pulse 66   Temp 98 F (36.7 C)  (Oral)   Resp 18   SpO2 95%  Physical Exam Vitals and nursing note reviewed.  Constitutional:      General: She is not in acute distress.    Appearance: She is not toxic-appearing.  HENT:     Head: Normocephalic.     Nose: Nose normal.  Eyes:     Conjunctiva/sclera: Conjunctivae normal.  Cardiovascular:     Rate and Rhythm: Normal rate.  Pulmonary:     Effort: Pulmonary effort is normal.     Breath sounds: Normal breath sounds.  Abdominal:     General: Abdomen is flat. There is no distension.     Palpations: Abdomen is soft.     Tenderness: There is no abdominal tenderness. There is no guarding or rebound.  Musculoskeletal:        General: Normal range of motion.     Right lower leg: No edema.     Left lower leg: No edema.  Skin:    General: Skin is warm.     Capillary Refill: Capillary refill takes less than 2 seconds.  Neurological:     Mental Status: She is alert and oriented to person, place, and time.  Psychiatric:        Behavior: Behavior normal.     ED Results / Procedures / Treatments   Labs (all labs ordered are listed, but only abnormal results are displayed) Labs Reviewed  CBC - Abnormal; Notable for the following components:      Result Value   RBC 3.82 (*)    All other components within normal limits  COMPREHENSIVE METABOLIC PANEL - Abnormal; Notable for the following components:   Glucose, Bld 109 (*)    BUN 25 (*)    Creatinine, Ser 1.09 (*)    GFR, Estimated 55 (*)    All other components within normal limits  BRAIN NATRIURETIC PEPTIDE  LIPASE, BLOOD  TROPONIN I (HIGH SENSITIVITY)  TROPONIN I (HIGH SENSITIVITY)  TROPONIN I (HIGH SENSITIVITY)    EKG None  Radiology DG Chest Portable 1 View  Result Date: 04/23/2023 CLINICAL DATA:  Chest pain EXAM: PORTABLE CHEST 1 VIEW COMPARISON:  12/13/2021 FINDINGS: Apical lordotic positioning. Midline trachea. Mild cardiomegaly. Atherosclerosis in the transverse aorta. No pleural effusion or  pneumothorax. Clear lungs. Cholecystectomy clips. IMPRESSION: 1. No acute cardiopulmonary disease. 2. Cardiomegaly without failure. 3.  Aortic Atherosclerosis (ICD10-I70.0). Electronically Signed   By: Jeronimo Greaves M.D.   On: 04/23/2023 18:35    Procedures Procedures    Medications Ordered in ED Medications  alum & mag hydroxide-simeth (MAALOX/MYLANTA) 200-200-20 MG/5ML suspension 30 mL (30 mLs Oral Given 04/23/23 1830)  ondansetron (ZOFRAN-ODT) disintegrating tablet 4 mg (4 mg Oral Given 04/23/23 1829)  ondansetron (ZOFRAN-ODT) disintegrating tablet 4 mg (4 mg Oral Given 04/23/23 2204)    ED Course/ Medical Decision  Making/ A&P Clinical Course as of 04/23/23 2348  Fri Apr 23, 2023  1755 Echo 01/29/22: "IMPRESSIONS     1. LV function normalized compared to 12/03/21.   2. Left ventricular ejection fraction, by estimation, is 60 to 65%. The  left ventricle has normal function. The left ventricle has no regional  wall motion abnormalities. There is mild left ventricular hypertrophy of  the basal-septal segment. Left  ventricular diastolic parameters were normal.   3. Right ventricular systolic function is normal. The right ventricular  size is normal. Tricuspid regurgitation signal is inadequate for assessing  PA pressure.   4. The mitral valve is normal in structure. Trivial mitral valve  regurgitation. No evidence of mitral stenosis.   5. The aortic valve is tricuspid. Aortic valve regurgitation is not  visualized. Aortic valve sclerosis is present, with no evidence of aortic  valve stenosis.   6. The inferior vena cava is normal in size with greater than 50%  respiratory variability, suggesting right atrial pressure of 3 mmHg.    [TY]  2134 Patient reevaluated.  Continues to be chest pain-free.  Workup reassuring.  No leukocytosis fever or tachycardia to suggest systemic infection.  No anemia on CBC.  Comprehensive metabolic panel with no significant metabolic derangements.  No  transaminitis to suggest hepatobiliary disease lipase normal.  Pancreatitis unlikely.  Troponin negative x 2.  EKG on my independent interpretation normal sinus rhythm with no ST segment changes to indicate ischemia.  BNP not elevated; acute CHF unlikely.  Shared decision making with patient regarding admission for further testing as she does have some risk factors versus outpatient testing.  Offered admission.  Patient declined and would like to follow-up with her cardiologist. However, she seemingly did have improvement with GI cocktail and Zofran.  Will discharge with Protonix and Zofran as she has been complaining of reflux type symptoms that been worsening over the past several months.  She is stable for discharge at this time. [TY]    Clinical Course User Index [TY] Coral Spikes, DO                                 Medical Decision Making Well-appearing 24-year-old female present emergency department for chest pain.  Afebrile vital signs reassuring.  EKG independently reviewed by myself no ST segment changes to indicate ischemia.  Appears to be normal sinus rhythm.  Chest x-ray without pneumothorax pneumonia on my independent interpretation.  CBC with no leukocytosis.  She has no fever or tachycardia to suggest systemic infection.  Troponin negative.  Will get repeat troponin.  However ACS less likely.  Low risk for PE based on Wells criteria.  Suspect symptoms related to GERD.  CMP without transaminitis to suggest hepatobiliary disease.  Lipase normal.  Pancreatitis unlikely.  Will treat supportively GI cocktail, Zofran.  See ED course for final MDM disposition.  Amount and/or Complexity of Data Reviewed Labs: ordered. Radiology: ordered. ECG/medicine tests: ordered.  Risk OTC drugs. Prescription drug management.          Final Clinical Impression(s) / ED Diagnoses Final diagnoses:  Chest pain, unspecified type    Rx / DC Orders ED Discharge Orders          Ordered     ondansetron (ZOFRAN) 4 MG tablet  Every 6 hours        04/23/23 2136    pantoprazole (PROTONIX) 20 MG tablet  Daily  04/23/23 2136              Coral Spikes, DO 04/23/23 2348

## 2023-04-23 NOTE — Discharge Instructions (Signed)
As discussed please follow-up with your cardiologist and your primary doctor.  Return immediately felt fevers, chills, worsening chest pain, shortness of breath, inability to eat or drink due to nausea and vomiting, abdominal pain.  Or please return if you develop any new or worsening symptoms that are concerning to you.

## 2023-04-23 NOTE — ED Triage Notes (Signed)
Pt c/o nausea and lightheadness that began yesterday + bilateral shoulder pain for several weeks  Shoulder pain worse with movement.  Pt also reports pain intensified in left shoulder and arm 2 days ago.  Pt h/o broken heart syndrome and took nitroglycerin SL at home during episode of left arm pain and states pain somewhat resolved.  Denies CP or SOB at present.  Pt ambulated to triage w/o difficulty, NAD, AAOx4.

## 2023-05-02 DIAGNOSIS — M5489 Other dorsalgia: Secondary | ICD-10-CM | POA: Diagnosis not present

## 2023-05-02 DIAGNOSIS — R110 Nausea: Secondary | ICD-10-CM | POA: Diagnosis not present

## 2023-05-03 ENCOUNTER — Encounter: Payer: PPO | Admitting: Physician Assistant

## 2023-05-03 NOTE — Progress Notes (Signed)
Error. Patient wanted appt with PCP.  This encounter was created in error - please disregard.

## 2023-05-04 ENCOUNTER — Other Ambulatory Visit: Payer: Self-pay

## 2023-05-04 ENCOUNTER — Emergency Department (HOSPITAL_COMMUNITY): Payer: PPO

## 2023-05-04 ENCOUNTER — Emergency Department (HOSPITAL_COMMUNITY)
Admission: EM | Admit: 2023-05-04 | Discharge: 2023-05-04 | Disposition: A | Discharge status: Home / Self Care | Payer: PPO | Attending: Emergency Medicine | Admitting: Emergency Medicine

## 2023-05-04 ENCOUNTER — Encounter (HOSPITAL_COMMUNITY): Payer: Self-pay

## 2023-05-04 DIAGNOSIS — Z9049 Acquired absence of other specified parts of digestive tract: Secondary | ICD-10-CM | POA: Diagnosis not present

## 2023-05-04 DIAGNOSIS — Z7982 Long term (current) use of aspirin: Secondary | ICD-10-CM | POA: Diagnosis not present

## 2023-05-04 DIAGNOSIS — M25511 Pain in right shoulder: Secondary | ICD-10-CM | POA: Insufficient documentation

## 2023-05-04 DIAGNOSIS — R112 Nausea with vomiting, unspecified: Secondary | ICD-10-CM | POA: Diagnosis not present

## 2023-05-04 DIAGNOSIS — R110 Nausea: Secondary | ICD-10-CM

## 2023-05-04 DIAGNOSIS — M25512 Pain in left shoulder: Secondary | ICD-10-CM | POA: Diagnosis not present

## 2023-05-04 DIAGNOSIS — R079 Chest pain, unspecified: Secondary | ICD-10-CM | POA: Diagnosis not present

## 2023-05-04 DIAGNOSIS — I700 Atherosclerosis of aorta: Secondary | ICD-10-CM | POA: Diagnosis not present

## 2023-05-04 DIAGNOSIS — R0789 Other chest pain: Secondary | ICD-10-CM

## 2023-05-04 LAB — TROPONIN I (HIGH SENSITIVITY)
Troponin I (High Sensitivity): 4 ng/L (ref <18)
Troponin I (High Sensitivity): 4 ng/L (ref <18)

## 2023-05-04 LAB — CBC
HCT: 38.2 % (ref 36.0–46.0)
Hemoglobin: 12.6 g/dL (ref 12.0–15.0)
MCH: 31.7 pg (ref 26.0–34.0)
MCHC: 33 g/dL (ref 30.0–36.0)
MCV: 96 fL (ref 80.0–100.0)
Platelets: 275 10*3/uL (ref 150–400)
RBC: 3.98 MIL/uL (ref 3.87–5.11)
RDW: 12 % (ref 11.5–15.5)
WBC: 7.6 10*3/uL (ref 4.0–10.5)
nRBC: 0 % (ref 0.0–0.2)

## 2023-05-04 LAB — BASIC METABOLIC PANEL
Anion gap: 12 (ref 5–15)
BUN: 13 mg/dL (ref 8–23)
CO2: 24 mmol/L (ref 22–32)
Calcium: 9.4 mg/dL (ref 8.9–10.3)
Chloride: 102 mmol/L (ref 98–111)
Creatinine, Ser: 1.03 mg/dL — ABNORMAL HIGH (ref 0.44–1.00)
GFR, Estimated: 58 mL/min — ABNORMAL LOW (ref >60)
Glucose, Bld: 87 mg/dL (ref 70–99)
Potassium: 4 mmol/L (ref 3.5–5.1)
Sodium: 138 mmol/L (ref 135–145)

## 2023-05-04 NOTE — ED Provider Notes (Signed)
[place] EMERGENCY DEPARTMENT AT [facility] Provider Note   CSN: [id number] Arrival date & time: 05/04/23  4696     History  Chief Complaint  Patient presents with   Nausea    Pain under her breast that wraps around her back.  Pain is intermittent and sharp     Kayla Deleon is a 72 y.o. female.  Patient has been struggling with some left-sided shoulder pain sometimes bilateral shoulder pain some side pain underneath her breast that wraps around to the back patient's had some nausea and vomiting problems.  Patient was seen at [facility] for nausea December 6.  Had normal LFTs at that time.  Patient was seen by [facility] this morning.  They were planning on an ultrasound but that was can take several days so patient came in to get that done earlier.  Patient also seen at [facility] walk-in clinic on Sunday.  Patient has discovered that Dramamine seems to help her the best.  She has been on Zofran she has been on Pepcid and I believe they have also tried Protonix.  When she was seen by [facility] they were thinking in terms of initially doing ultrasound and then getting gastric emptying studies.       Home Medications Prior to Admission medications   Medication Sig Start Date End Date Taking? Authorizing Provider  aspirin EC ([facility] ASPIRIN LOW DOSE) 81 MG tablet TAKE 1 TABLET (81 MG TOTAL) BY MOUTH DAILY. SWALLOW WHOLE. 04/21/23   Azalee Course, PA  augmented betamethasone dipropionate (DIPROLENE-AF) 0.05 % cream Apply 1 Application topically 2 (two) times daily as needed for rash. 11/17/21   [provider]  buPROPion (WELLBUTRIN SR) 200 MG 12 hr tablet Take 200 mg by mouth daily. 10/19/15   [provider]  carvedilol (COREG) 6.25 MG tablet TAKE 1 TABLET BY MOUTH TWICE A DAY WITH FOOD 11/23/22   Nahser, Deloris Ping, MD  EPINEPHrine 0.3 mg/0.3 mL IJ SOAJ injection Inject 0.3 mg into the muscle daily as needed for anaphylaxis.    [provider]  escitalopram (LEXAPRO)  20 MG tablet Take 1 tablet (20 mg total) by mouth daily. For depression and anxiety. 08/28/11   Viviann Spare, FNP  Evolocumab (REPATHA SURECLICK) 140 MG/ML SOAJ INJECT 1 DOSE INTO THE SKIN EVERY 14 (FOURTEEN) DAYS. 01/29/23   Hilty, Lisette Abu, MD  ezetimibe (ZETIA) 10 MG tablet TAKE 1 TABLET BY MOUTH EVERY DAY 04/20/23   Hilty, Lisette Abu, MD  famotidine (PEPCID) 40 MG tablet Take 40 mg by mouth as needed for heartburn or indigestion.    [provider]  isosorbide mononitrate (IMDUR) 60 MG 24 hr tablet TAKE 1 TABLET BY MOUTH EVERY DAY 09/24/22   Hilty, Lisette Abu, MD  lamoTRIgine (LAMICTAL) 25 MG tablet Take 50 mg by mouth daily.    [provider]  losartan (COZAAR) 50 MG tablet TAKE 1 TABLET BY MOUTH EVERYDAY AT BEDTIME 11/23/22   Nahser, Deloris Ping, MD  Multiple Vitamin (MULTIVITAMIN) tablet Take 1 tablet by mouth daily.    [provider]  NEOMYCIN-POLYMYXIN-HYDROCORTISONE (CORTISPORIN) 1 % SOLN OTIC solution Apply 1-2 drops to toe BID after soaking 06/16/22   Hyatt, Max T, DPM  nitroGLYCERIN (NITROSTAT) 0.4 MG SL tablet Place 1 tablet (0.4 mg total) under the tongue every 5 (five) minutes as needed for chest pain (up to 3 doses. If taking 3rd dose call 911). 04/19/23   Hilty, Lisette Abu, MD  Omega-3 Fatty Acids (FISH  OIL PO) Take 2 capsules by mouth daily.    [provider]  omeprazole (PRILOSEC) 40 MG capsule Take 40 mg by mouth daily as needed (acid reflux).    [provider]  ondansetron (ZOFRAN) 4 MG tablet Take 1 tablet (4 mg total) by mouth every 6 (six) hours. 04/23/23   Coral Spikes, DO  pantoprazole (PROTONIX) 20 MG tablet Take 1 tablet (20 mg total) by mouth daily for 14 days. 04/23/23 05/07/23  Coral Spikes, DO  Probiotic Product (PROBIOTIC PO) Take 1 capsule by mouth daily.    [provider]  spironolactone (ALDACTONE) 25 MG tablet TAKE 1 TABLET (25 MG TOTAL) BY MOUTH DAILY. 11/23/22   Nahser, Deloris Ping, MD      Allergies     Shellfish allergy, Fentanyl, Percocet [oxycodone-acetaminophen], and Statins    Review of Systems   Review of Systems  Constitutional:  Negative for chills and fever.  HENT:  Negative for ear pain and sore throat.   Eyes:  Negative for pain and visual disturbance.  Respiratory:  Negative for cough and shortness of breath.   Cardiovascular:  Positive for chest pain. Negative for palpitations.  Gastrointestinal:  Positive for abdominal pain, nausea and vomiting.  Genitourinary:  Negative for dysuria and hematuria.  Musculoskeletal:  Positive for back pain. Negative for arthralgias.  Skin:  Negative for color change and rash.  Neurological:  Negative for seizures and syncope.  All other systems reviewed and are negative.   Physical Exam Updated Vital Signs BP 128/70 (BP Location: Right Arm)   Pulse 64   Temp 98.1 F (36.7 C) (Oral)   Resp 19   Ht 1.626 m (5\' 4" )   Wt 65.8 kg   SpO2 100%   BMI 24.89 kg/m  Physical Exam Vitals and nursing note reviewed.  Constitutional:      General: She is not in acute distress.    Appearance: Normal appearance. She is well-developed.  HENT:     Head: Normocephalic and atraumatic.  Eyes:     General: No scleral icterus.    Extraocular Movements: Extraocular movements intact.     Conjunctiva/sclera: Conjunctivae normal.     Pupils: Pupils are equal, round, and reactive to light.  Cardiovascular:     Rate and Rhythm: Normal rate and regular rhythm.     Heart sounds: No murmur heard. Pulmonary:     Effort: Pulmonary effort is normal. No respiratory distress.     Breath sounds: Normal breath sounds.  Abdominal:     General: There is no distension.     Palpations: Abdomen is soft.     Tenderness: There is no abdominal tenderness. There is no guarding.  Musculoskeletal:        General: No swelling.     Cervical back: Normal range of motion and neck supple.     Right lower leg: No edema.     Left lower leg: No edema.  Skin:    General:  Skin is warm and dry.     Capillary Refill: Capillary refill takes less than 2 seconds.  Neurological:     General: No focal deficit present.     Mental Status: She is alert and oriented to person, place, and time.  Psychiatric:        Mood and Affect: Mood normal.     ED Results / Procedures / Treatments   Labs (all labs ordered are listed, but only abnormal results are displayed) Labs Reviewed  BASIC METABOLIC  PANEL - Abnormal; Notable for the following components:      Result Value   Creatinine, Ser 1.03 (*)    GFR, Estimated 58 (*)    All other components within normal limits  CBC  TROPONIN I (HIGH SENSITIVITY)  TROPONIN I (HIGH SENSITIVITY)    EKG EKG Interpretation Date/Time:  Tuesday May 04 2023 11:01:16 EST Ventricular Rate:  73 PR Interval:  172 QRS Duration:  118 QT Interval:  396 QTC Calculation: 436 R Axis:   59  Text Interpretation: Normal sinus rhythm Septal infarct , age undetermined Abnormal ECG When compared with ECG of 23-Apr-2023 16:02, PREVIOUS ECG IS PRESENT TWI in lateral leads are new anterior ST elevation is more pronounced Confirmed by Derwood Kaplan [phone]) on 05/04/2023 11:05:32 AM  Radiology US Abdomen Complete Result Date: 05/04/2023 CLINICAL DATA:  Nausea EXAM: ABDOMEN ULTRASOUND COMPLETE COMPARISON:  None Available. FINDINGS: Gallbladder: Surgically absent Common bile duct: Diameter: 10 mm, prominent Liver: Increased echogenicity. No focal lesion. Portal vein is patent on color Doppler imaging with normal direction of blood flow towards the liver. IVC: No abnormality visualized. Pancreas: Visualized portion unremarkable. Spleen: Size and appearance within normal limits. Right Kidney: Length: 8.9 cm. Echogenicity within normal limits. No mass or hydronephrosis visualized. Left Kidney: Length: 9.8 cm. Echogenicity within normal limits. No mass or hydronephrosis visualized. Abdominal aorta: No aneurysm visualized. Other findings: None.  IMPRESSION: 1. Status post cholecystectomy. 2. Common bile duct is prominent measuring 10 mm. This may be secondary to post cholecystectomy state. Recommend correlation with LFTs. 3. Increased hepatic parenchymal echogenicity suggestive of steatosis. Electronically Signed   By: Annia Belt M.D.   On: 05/04/2023 15:32   DG Chest 2 View Result Date: 05/04/2023 CLINICAL DATA:  Chest pain.  Nausea. EXAM: CHEST - 2 VIEW COMPARISON:  X-ray 04/23/2023 FINDINGS: No consolidation, pneumothorax or effusion. Normal cardiopericardial silhouette without edema. Calcified aorta. Curvature and degenerative changes along the spine. Surgical clips in the upper abdomen. IMPRESSION: No acute cardiopulmonary disease. Electronically Signed   By: Karen Kays M.D.   On: 05/04/2023 13:39    Procedures Procedures    Medications Ordered in ED Medications - No data to display  ED Course/ Medical Decision Making/ A&P                                 Medical Decision Making Amount and/or Complexity of Data Reviewed Labs: ordered. Radiology: ordered.   Patient's troponins here times 2 or 4.  Basic metabolic panel GFR 58 creatinine up a little bit white count normal.  Electrolytes normal.  Ultrasound abdomen was what GI medicine wanted showed status postcholecystectomy common bile duct measuring 10 mm.  Patient did not have LFTs today.  But LFTs on December 6 were normal.  Patient states that she is doing much better on Dramamine.  Recommend she follow back up with GI.  She is disappointed with GI [place] switch groups.  Can see where patient would benefit from gastric emptying studies may be upper endoscopy may be consideration for CT scan of abdomen pelvis.  Also would follow her LFTs.  If they do bump may need an evaluation of the common bile duct.  No signs of acute cardiac event.  And chest x-ray was also negative.  EKG without any acute abnormalities.   Final Clinical Impression(s) / ED Diagnoses Final  diagnoses:  Atypical chest pain  Nausea and vomiting, unspecified vomiting type  Rx / DC Orders ED Discharge Orders     None         Vanetta Mulders, MD 05/04/23 [phone]

## 2023-05-04 NOTE — Discharge Instructions (Signed)
Recommend follow back up with gastroenterology.  As we discussed gastric emptying study would make sense upper endoscopy would make sense.  The may want to consider CT scan abdomen and pelvis.  Continue the Dramamine since that seems to be helping.  Today's labs without any significant abnormalities.

## 2023-05-04 NOTE — ED Triage Notes (Signed)
Pt. Reports having nausea, lightheadedness and bilateral shoulder pain and pain under her breast that wraps around her back. Denies any vomiting.  She has been to [facility], [facility] this am and [facility] on Sunday  Pt. Reports the Zofran helps very little, dramamine helps the best,

## 2023-05-05 ENCOUNTER — Other Ambulatory Visit (HOSPITAL_COMMUNITY): Payer: Self-pay | Admitting: Nurse Practitioner

## 2023-05-05 DIAGNOSIS — R110 Nausea: Secondary | ICD-10-CM

## 2023-05-06 DIAGNOSIS — R110 Nausea: Secondary | ICD-10-CM | POA: Diagnosis not present

## 2023-05-06 DIAGNOSIS — Z6825 Body mass index (BMI) 25.0-25.9, adult: Secondary | ICD-10-CM | POA: Diagnosis not present

## 2023-05-07 ENCOUNTER — Ambulatory Visit (HOSPITAL_COMMUNITY)
Admission: RE | Admit: 2023-05-07 | Discharge: 2023-05-07 | Disposition: A | Discharge status: Home / Self Care | Payer: PPO | Source: Ambulatory Visit | Attending: Nurse Practitioner | Admitting: Nurse Practitioner

## 2023-05-07 DIAGNOSIS — Z0389 Encounter for observation for other suspected diseases and conditions ruled out: Secondary | ICD-10-CM | POA: Diagnosis not present

## 2023-05-07 DIAGNOSIS — R110 Nausea: Secondary | ICD-10-CM | POA: Diagnosis not present

## 2023-05-07 MED ORDER — TECHNETIUM TC 99M SULFUR COLLOID
2.0000 | Freq: Once | INTRAVENOUS | Status: AC | PRN
  Administered 2023-05-07: 2 via INTRAVENOUS

## 2023-05-20 ENCOUNTER — Other Ambulatory Visit: Payer: Self-pay | Admitting: Cardiovascular Disease

## 2023-05-24 ENCOUNTER — Telehealth: Payer: Self-pay | Admitting: Internal Medicine

## 2023-05-24 NOTE — Telephone Encounter (Signed)
 Patient is requesting a transfer of care to Dr. Rhea Belton, requested records for review.

## 2023-05-26 NOTE — Telephone Encounter (Signed)
 Ok for APP appt JMP

## 2023-05-26 NOTE — Telephone Encounter (Signed)
 Good morning Dr. Albertus,  Patient is requesting a transfer of care from [facility], specifically requesting for you to possibly continue her GI care. Stated she received a letter from [facility] dismissing her due to miscommunication. Was recently at the ED in December for nausea, stated she still is experiencing a burning sensation on her stomach and has some appetite loss. Her last colonoscopy was in 2017 and results were normal. Records are in the media tab in Epic for you to review and advise on scheduling.   Thank you

## 2023-05-27 NOTE — Progress Notes (Signed)
 Office Visit    Patient Name: Kayla Deleon Date of Encounter: 05/28/2023  Primary Care Provider:  Cleotilde Planas, MD Primary Cardiologist:  Vinie JAYSON Maxcy, MD  Chief Complaint    73 year old female with a history of nonobstructive CAD, stress-induced cardiomyopathy, hypertension, hyperlipidemia, prediabetes, and OSA who presents for follow-up related to cardiomyopathy and hypertension.   Past Medical History    Past Medical History:  Diagnosis Date   Anxiety    Cervical spondylosis    evaluated in the past by neurossurgeon   Depression    Fibroids    in the past    Hypercholesteremia    Hypertension    Mental disorder    Sleep apnea    on CPAP   Past Surgical History:  Procedure Laterality Date   CESAREAN SECTION     CHOLECYSTECTOMY     LEFT HEART CATH AND CORONARY ANGIOGRAPHY N/A 12/03/2021   Procedure: LEFT HEART CATH AND CORONARY ANGIOGRAPHY;  Surgeon: Wonda Sharper, MD;  Location: [facility] INVASIVE CV LAB;  Service: Cardiovascular;  Laterality: N/A;    Allergies  Allergies  Allergen Reactions   Shellfish Allergy Anaphylaxis and Rash    Pt. Uses Epipen. Pt. Uses Epipen.    Fentanyl  Other (See Comments)    Confusion, sweating   Percocet Delaine.danker ] Other (See Comments)    Confusion, AMS   Statins Other (See Comments)    Severe muscle pain     Labs/Other Studies Reviewed    The following studies were reviewed today:  Cardiac Studies & Procedures   CARDIAC CATHETERIZATION  CARDIAC CATHETERIZATION 12/03/2021  Narrative   Mid LAD lesion is 50% stenosed.   1st Diag lesion is 50% stenosed.   There is severe left ventricular systolic dysfunction.   LV end diastolic pressure is normal.  1.  Moderate nonobstructive stenosis of the mid LAD 2.  Mild nonobstructive plaquing of the left circumflex and right coronary arteries 3.  Severe segmental LV systolic dysfunction with periapical ballooning, hyperkinetic base, LVEF estimated at about 35%,  findings consistent with acute Takotsubo syndrome  Recommend: CV-ICU monitoring overnight, beta-blocker and ARB, aspirin  81 mg daily, continued risk reduction measures for nonobstructive CAD.  Check 2D echo.  Findings Coronary Findings Diagnostic  Dominance: Right  Left Anterior Descending There is mild diffuse disease throughout the vessel. The LAD wraps the LV apex.  The vessel supplies a large territory myocardium.  The mid vessel has a 50% stenosis (40-50).  The diagonal branch has a 50% stenosis and this is a relatively small caliber vessel.  There are no high-grade obstructive lesions throughout the LAD distribution. Mid LAD lesion is 50% stenosed.  First Diagonal Branch 1st Diag lesion is 50% stenosed.  Left Circumflex The vessel exhibits minimal luminal irregularities. The circumflex is patent and gives off of a first obtuse marginal branch with no significant disease.  The vessel has minimal plaquing.  Right Coronary Artery There is mild diffuse disease throughout the vessel. There is mild plaquing in the mid vessel with no significant obstruction.  I would estimate that there is 25 to 30% stenosis throughout the mid vessel.  The vessel is dominant with no obstructive disease in the branch vessels of the RCA.  Intervention  No interventions have been documented.   STRESS TESTS  NM MYOCAR MULTI W/SPECT W 12/09/2012  ECHOCARDIOGRAM  ECHOCARDIOGRAM COMPLETE 01/29/2022  Narrative ECHOCARDIOGRAM REPORT    Patient Name:   Kayla Deleon Date of Exam: 01/29/2022 Medical Rec #:  [id number]  Height:       64.0 in Accession #:    [id number]     Weight:       139.0 lb Date of Birth:  1951/05/07       BSA:          1.676 m Patient Age:    71 years       BP:           128/73 mmHg Patient Gender: F              HR:           69 bpm. Exam Location:  [address]  Procedure: 2D Echo, 3D Echo, Cardiac Doppler and Color Doppler  Indications:    I42.9 Cardiomyopathy  History:         Patient has prior history of Echocardiogram examinations, most recent 12/03/2021. CAD; Risk Factors:Hypertension, Sleep Apnea and HLD.  Sonographer:    Waldo Guadalajara RCS Referring Phys: [id number] CALLIE E GOODRICH  IMPRESSIONS   1. LV function normalized compared to 12/03/21. 2. Left ventricular ejection fraction, by estimation, is 60 to 65%. The left ventricle has normal function. The left ventricle has no regional wall motion abnormalities. There is mild left ventricular hypertrophy of the basal-septal segment. Left ventricular diastolic parameters were normal. 3. Right ventricular systolic function is normal. The right ventricular size is normal. Tricuspid regurgitation signal is inadequate for assessing PA pressure. 4. The mitral valve is normal in structure. Trivial mitral valve regurgitation. No evidence of mitral stenosis. 5. The aortic valve is tricuspid. Aortic valve regurgitation is not visualized. Aortic valve sclerosis is present, with no evidence of aortic valve stenosis. 6. The inferior vena cava is normal in size with greater than 50% respiratory variability, suggesting right atrial pressure of 3 mmHg.  FINDINGS Left Ventricle: Left ventricular ejection fraction, by estimation, is 60 to 65%. The left ventricle has normal function. The left ventricle has no regional wall motion abnormalities. The left ventricular internal cavity size was normal in size. There is mild left ventricular hypertrophy of the basal-septal segment. Left ventricular diastolic parameters were normal.  Right Ventricle: The right ventricular size is normal. Right ventricular systolic function is normal. Tricuspid regurgitation signal is inadequate for assessing PA pressure. The tricuspid regurgitant velocity is 1.84 m/s, and with an assumed right atrial pressure of 3 mmHg, the estimated right ventricular systolic pressure is 16.5 mmHg.  Left Atrium: Left atrial size was normal in size.  Right Atrium:  Right atrial size was normal in size.  Pericardium: There is no evidence of pericardial effusion.  Mitral Valve: The mitral valve is normal in structure. Trivial mitral valve regurgitation. No evidence of mitral valve stenosis.  Tricuspid Valve: The tricuspid valve is normal in structure. Tricuspid valve regurgitation is trivial. No evidence of tricuspid stenosis.  Aortic Valve: The aortic valve is tricuspid. Aortic valve regurgitation is not visualized. Aortic valve sclerosis is present, with no evidence of aortic valve stenosis.  Pulmonic Valve: The pulmonic valve was normal in structure. Pulmonic valve regurgitation is not visualized. No evidence of pulmonic stenosis.  Aorta: The aortic root is normal in size and structure.  Venous: The inferior vena cava is normal in size with greater than 50% respiratory variability, suggesting right atrial pressure of 3 mmHg.  IAS/Shunts: No atrial level shunt detected by color flow Doppler.  Additional Comments: LV function normalized compared to 12/03/21.   LEFT VENTRICLE PLAX 2D LVIDd:         4.00  cm   Diastology LVIDs:         2.70 cm   LV e' medial:    8.92 cm/s LV PW:         0.90 cm   LV E/e' medial:  6.4 LV IVS:        1.20 cm   LV e' lateral:   9.03 cm/s LVOT diam:     1.70 cm   LV E/e' lateral: 6.3 LV SV:         49 LV SV Index:   29 LVOT Area:     2.27 cm  3D Volume EF: 3D EF:        58 % LV EDV:       83 ml LV ESV:       34 ml LV SV:        48 ml  RIGHT VENTRICLE RV Basal diam:  2.70 cm RV S prime:     14.30 cm/s TAPSE (M-mode): 2.6 cm RVSP:           16.5 mmHg  LEFT ATRIUM             Index        RIGHT ATRIUM           Index LA diam:        2.80 cm 1.67 cm/m   RA Pressure: 3.00 mmHg LA Vol (A2C):   25.3 ml 15.09 ml/m  RA Area:     8.90 cm LA Vol (A4C):   32.0 ml 19.09 ml/m  RA Volume:   16.20 ml  9.67 ml/m LA Biplane Vol: 28.4 ml 16.94 ml/m AORTIC VALVE LVOT Vmax:   95.80 cm/s LVOT Vmean:  71.600  cm/s LVOT VTI:    0.217 m  AORTA Ao Root diam: 2.50 cm Ao Asc diam:  2.50 cm  MITRAL VALVE               TRICUSPID VALVE MV Area (PHT):             TR Peak grad:   13.5 mmHg MV Decel Time:             TR Vmax:        184.00 cm/s MV E velocity: 57.10 cm/s  Estimated RAP:  3.00 mmHg MV A velocity: 91.70 cm/s  RVSP:           16.5 mmHg MV E/A ratio:  0.62 SHUNTS Systemic VTI:  0.22 m Systemic Diam: 1.70 cm  Redell Shallow MD Electronically signed by Redell Shallow MD Signature Date/Time: 01/29/2022/12:45:59 PM    Final    CT SCANS  CT CORONARY MORPH W/CTA COR W/SCORE 12/02/2021  Addendum 12/02/2021  4:49 PM ADDENDUM REPORT: 12/02/2021 16:47  CLINICAL DATA:  Chest pain  EXAM: Cardiac CTA  MEDICATIONS: Sub lingual nitro. 4 mg and lopressor  100mg   TECHNIQUE: The patient was scanned on a [employer] 192 scanner. Gantry rotation speed was 250 msecs. Collimation was. 6 mm . A 120 kV prospective scan was triggered in the ascending thoracic aorta at 140 HU's with full mA between 30-70% of the R-R interval . Average HR during the scan was 66 bpm. The 3D data set was interpreted on a dedicated work station using MPR, MIP and VRT modes. A total of 80 cc of contrast was used.  FINDINGS: Non-cardiac: See separate report from [facility]. No significant findings on limited lung and soft tissue windows.  Calcium  score: Calcium  noted in RCA and LAD  LM 0  LAD 47.9  RCA 81  LCX 0  Total 129 which is 79 th percentile for age/sex  Coronary Arteries: Right dominant with no anomalies  LM: Normal  LAD: 1-24% calcified plaque proximally, 25-49% mixed plaque in mid vessel 25-49% soft plaque distally with mis registration artifact  D1: Small vessel normal  D2: Small vessel normal  Circumflex: Normal  OM1: Normal  OM2: Normal  RCA: 1-24% calcified plaque in mid vessel. 1-24% mixed plaque in distal vessel Significant motion artifact present  PDA:  Normal  PLA: Normal  IMPRESSION: 1. Calcium  score 129 which is 79 th percentile for age/sex  2.  Normal ascending thoracic aorta 3.2 cm  3.  CAD RADS 2 non obstructive CAD see description above  Kayla Deleon   Electronically Signed By: Kayla Deleon M.D. On: 12/02/2021 16:47  Narrative EXAM: OVER-READ INTERPRETATION  CT CHEST  The following report is an over-read performed by radiologist Dr. Marcey Moan of [facility] on 12/02/2021. This over-read does not include interpretation of cardiac or coronary anatomy or pathology. The coronary CTA interpretation by the cardiologist is attached.  COMPARISON:  Prior calcium  score study on 10/12/2019  FINDINGS: Vascular: No significant noncardiac vascular findings.  Mediastinum/Nodes: Visualized mediastinum and hilar regions demonstrate no lymphadenopathy or masses. Small hiatal hernia present.  Lungs/Pleura: Visualized lungs show no evidence of pulmonary edema, consolidation, pneumothorax, nodule or pleural fluid.  Upper Abdomen: No acute abnormality.  Musculoskeletal: No chest wall mass or suspicious bone lesions identified.  IMPRESSION: Small hiatal hernia.  Electronically Signed: By: Marcey Moan M.D. On: 12/02/2021 16:20   CT SCANS  CT CARDIAC SCORING (SELF PAY ONLY) 10/12/2019  Addendum 10/12/2019  5:21 PM ADDENDUM REPORT: 10/12/2019 17:19  CLINICAL DATA:  Risk stratification  EXAM: Coronary Calcium  Score  TECHNIQUE: The patient was scanned on a [employer] scanner. Axial non-contrast 3 mm slices were carried out through the heart. The data set was analyzed on a dedicated work station and scored using the Agatson method.  FINDINGS: Non-cardiac: See separate report from [facility].  Ascending Aorta: Normal caliber.  Atherosclerosis.  Pericardium: Normal  Coronary arteries: Normal origin.  Calcification of the RCA and LAD.  Aortic leaflet tip  calcification.  IMPRESSION: Coronary calcium  score of 117 . This was 81st percentile for age and sex matched control.   Electronically Signed By: Vinie JAYSON Maxcy M.D. On: 10/12/2019 17:19  Narrative EXAM: OVER-READ INTERPRETATION  CT CHEST  The following report is an over-read performed by radiologist Dr. Rockey Kilts of [facility] on 10/12/2019. This over-read does not include interpretation of cardiac or coronary anatomy or pathology. The calcium  score interpretation by the cardiologist is attached.  COMPARISON:  03/12/2018 chest radiograph.  FINDINGS: Vascular: Aortic atherosclerosis.  Mediastinum/Nodes: No imaged thoracic adenopathy.  Lungs/Pleura: No pleural fluid.  Clear imaged lungs.  Upper Abdomen: Normal imaged portions of the liver, spleen, stomach.  Musculoskeletal: No acute osseous abnormality.  IMPRESSION: 1. No acute process in the imaged extracardiac chest. 2. Aortic Atherosclerosis (ICD10-I70.0).  Electronically Signed: By: Rockey Kilts M.D. On: 10/12/2019 16:02         Recent Labs: 04/23/2023: ALT 26; B Natriuretic Peptide 48.4 05/04/2023: BUN 13; Creatinine, Ser 1.03; Hemoglobin 12.6; Platelets 275; Potassium 4.0; Sodium 138  Recent Lipid Panel    Component Value Date/Time   CHOL 142 12/03/2021 0837   TRIG 160 (H) 12/03/2021 0837   HDL 63 12/03/2021 0837   CHOLHDL 2.3 12/03/2021 9162  VLDL 32 12/03/2021 0837   LDLCALC 47 12/03/2021 0837    History of Present Illness    73 year old female with the above past medical history including nonobstructive CAD, stress-induced cardiomyopathy, hypertension, hyperlipidemia, prediabetes, and OSA.   She has followed with Dr. Mona for a history of familial hyperlipidemia.  She was seen in the office in June 2023 for further evaluation of an abnormal EKG that was obtained prior to visit at our urgent care at which time she was treated for COVID-19.  Coronary CTA showed nonobstructive CAD  (50% mid LAD stenosis, 50% first diagonal stenosis, mild diffuse plaquing throughout the RCA). She was later hospitalized in July 2023 in the setting of chest pain, STEMI. Emergent cardiac catheterization showed moderate nonobstructive CAD but severe LV dysfunction consistent with Takotsubo cardiomyopathy.  Echocardiogram showed EF 30 to 35% with akinesis of the mid to apical inferior, mid apical anterior, and mid to apical septal segments as well as G1 DD, distant with stress-induced cardiomyopathy.  Post cath she became acutely delirious.   She had acute hypoxic respiratory failure following and required supplemental O2. Ultimately her symptoms were felt to be due to a negative reaction to fentanyl  and/or antiemetic use, possible hyponatremia in the setting of excessive water intake and nausea, vomiting.  She was started on losartan , carvedilol , and spironolactone . Repeat echocardiogram in 01/2022 showed EF 60 to 65%, normal LV function, no RWMA, mild LVH, normal RV, no significant valvular abnormalities.  She was last seen in the office on 06/11/2022 and was doing well from a cardiac standpoint.  She denied symptoms concerning for angina.  She was seen virtually on 10/28/2022 for preoperative cardiac evaluation and was doing well.  She was evaluated in the ED twice in December 2024 in the setting of nausea, epigastric pain.  Troponin was negative.  She has planned follow-up with GI.  She presents today for follow-up.  Since her last visit she has been stable from a cardiac standpoint.  She continues to experience nausea, this has been ongoing since December, as well as epigastric discomfort.  Her symptoms are constant, she has noted some improvement with over-the-counter Prilosec.  She is in the process of transferring her GI care from [facility] to [facility] with plans for close follow-up.  She denies any symptoms concerning for angina, denies palpitations, dizziness, dyspnea, edema, PND, orthopnea, weight gain.   From a cardiac standpoint, she reports feeling well.    Home Medications    Current Outpatient Medications  Medication Sig Dispense Refill   aspirin  EC ([employer] ASPIRIN  LOW DOSE) 81 MG tablet TAKE 1 TABLET (81 MG TOTAL) BY MOUTH DAILY. SWALLOW WHOLE. 90 tablet 1   augmented betamethasone  dipropionate (DIPROLENE -AF) 0.05 % cream Apply 1 Application topically 2 (two) times daily as needed for rash.     buPROPion  (WELLBUTRIN  SR) 200 MG 12 hr tablet Take 200 mg by mouth daily.     carvedilol  (COREG ) 6.25 MG tablet TAKE 1 TABLET BY MOUTH TWICE A DAY WITH FOOD 180 tablet 3   EPINEPHrine 0.3 mg/0.3 mL IJ SOAJ injection Inject 0.3 mg into the muscle daily as needed for anaphylaxis.     escitalopram  (LEXAPRO ) 20 MG tablet Take 1 tablet (20 mg total) by mouth daily. For depression and anxiety. 30 tablet 0   ezetimibe  (ZETIA ) 10 MG tablet TAKE 1 TABLET BY MOUTH EVERY DAY 90 tablet 3   famotidine (PEPCID) 40 MG tablet Take 40 mg by mouth as needed for heartburn or indigestion.  isosorbide  mononitrate (IMDUR ) 60 MG 24 hr tablet TAKE 1 TABLET BY MOUTH EVERY DAY 90 tablet 3   lamoTRIgine  (LAMICTAL ) 25 MG tablet Take 50 mg by mouth daily.     losartan  (COZAAR ) 50 MG tablet TAKE 1 TABLET BY MOUTH EVERYDAY AT BEDTIME 90 tablet 3   Multiple Vitamin (MULTIVITAMIN) tablet Take 1 tablet by mouth daily.     nitroGLYCERIN  (NITROSTAT ) 0.4 MG SL tablet Place 1 tablet (0.4 mg total) under the tongue every 5 (five) minutes as needed for chest pain (up to 3 doses. If taking 3rd dose call 911). 25 tablet 3   Omega-3 Fatty Acids (FISH OIL PO) Take 2 capsules by mouth daily.     omeprazole  (PRILOSEC) 40 MG capsule Take 40 mg by mouth daily as needed (acid reflux).     spironolactone  (ALDACTONE ) 25 MG tablet TAKE 1 TABLET (25 MG TOTAL) BY MOUTH DAILY. 90 tablet 3   Evolocumab  (REPATHA  SURECLICK) 140 MG/ML SOAJ Inject 140 mg into the skin every 14 (fourteen) days. 6 mL 3   NEOMYCIN -POLYMYXIN-HYDROCORTISONE (CORTISPORIN) 1 %  SOLN OTIC solution Apply 1-2 drops to toe BID after soaking 10 mL 1   ondansetron  (ZOFRAN ) 4 MG tablet Take 1 tablet (4 mg total) by mouth every 6 (six) hours. 12 tablet 0   pantoprazole  (PROTONIX ) 20 MG tablet Take 1 tablet (20 mg total) by mouth daily for 14 days. (Patient not taking: Reported on 05/28/2023) 14 tablet 0   No current facility-administered medications for this visit.     Review of Systems    She denies chest pain, palpitations, dyspnea, pnd, orthopnea, n, v, dizziness, syncope, edema, weight gain, or early satiety. All other systems reviewed and are otherwise negative except as noted above.   Physical Exam    VS:  BP 104/68 (BP Location: Left Arm, Patient Position: Sitting, Cuff Size: Normal)   Pulse 76   Ht 5' 4 (1.626 m)   Wt 147 lb 3.2 oz (66.8 kg)   SpO2 96%   BMI 25.27 kg/m   GEN: Well nourished, well developed, in no acute distress. HEENT: normal. Neck: Supple, no JVD, carotid bruits, or masses. Cardiac: RRR, no murmurs, rubs, or gallops. No clubbing, cyanosis, edema.  Radials/DP/PT 2+ and equal bilaterally.  Respiratory:  Respirations regular and unlabored, clear to auscultation bilaterally. GI: Soft, nontender, nondistended, BS + x 4. MS: no deformity or atrophy. Skin: warm and dry, no rash. Neuro:  Strength and sensation are intact. Psych: Normal affect.  Accessory Clinical Findings    ECG personally reviewed by me today - EKG Interpretation Date/Time:  Friday May 28 2023 09:18:04 EST Ventricular Rate:  76 PR Interval:  166 QRS Duration:  102 QT Interval:  396 QTC Calculation: 445 R Axis:   1  Text Interpretation: Normal sinus rhythm Anteroseptal infarct (cited on or before 23-Apr-2023) When compared with ECG of 04-May-2023 11:29, T wave inversion no longer evident in Lateral leads Confirmed by Daneen Perkins ([id number]) on 05/28/2023 9:18:53 AM  - no acute changes.   Lab Results  Component Value Date   WBC 7.6 05/04/2023   HGB 12.6 05/04/2023    HCT 38.2 05/04/2023   MCV 96.0 05/04/2023   PLT 275 05/04/2023   Lab Results  Component Value Date   CREATININE 1.03 (H) 05/04/2023   BUN 13 05/04/2023   NA 138 05/04/2023   K 4.0 05/04/2023   CL 102 05/04/2023   CO2 24 05/04/2023   Lab Results  Component Value Date  ALT 26 04/23/2023   AST 19 04/23/2023   ALKPHOS 48 04/23/2023   BILITOT 0.3 04/23/2023   Lab Results  Component Value Date   CHOL 142 12/03/2021   HDL 63 12/03/2021   LDLCALC 47 12/03/2021   TRIG 160 (H) 12/03/2021   CHOLHDL 2.3 12/03/2021    Lab Results  Component Value Date   HGBA1C 5.8 (H) 12/03/2021    Assessment & Plan    1. Takotsubo/stress-induced cardiomyopathy: Echo in 11/2021 showed EF 30 to 35% with akinesis of the mid to apical inferior, mid apical anterior, and mid to apical septal segments as well as G1 DD, distant with stress-induced cardiomyopathy.  Cath showed moderate nonobstructive CAD as below.  Repeat echo in 01/2022 showed EF 60 to 65%, normal LV function, no RWMA, mild LVH, normal RV, no significant valvular abnormalities.   Euvolemic and well compensated on exam. Continue current medications as below.    2. Non-obstructive CAD: Cath in 11/2021 showed moderate nonobstructive CAD (50% mid LAD stenosis, 50% first diagonal stenosis, mild diffuse plaquing throughout the RCA). Stable with no anginal symptoms. Continue aspirin , carvedilol , losartan , spironolactone , Imdur , Repatha , and Zetia .   3. Hypertension: BP well controlled. Continue current antihypertensive regimen.    4. Hyperlipidemia: LDL was 59 in 06/2022. Will update NMR, lipoprofile. Continue Repeatha, Zetia .    5. Prediabetes: A1c was 5.8 in 11/2021, no recent A1c on file. Monitored and managed per PCP.    6. OSA: Adherent to CPAP. Denies any concerns today.    7. L carotid bruit: Carotid ultrasound in 02/2022 revealed normal carotid arteries. Asymptomatic. No indication for repeat testing at this time.    8. Disposition:  Follow-up in 1 year.       Damien JAYSON Braver, NP 05/28/2023, 10:24 AM

## 2023-05-28 ENCOUNTER — Ambulatory Visit: Payer: PPO | Attending: Nurse Practitioner | Admitting: Nurse Practitioner

## 2023-05-28 ENCOUNTER — Encounter: Payer: Self-pay | Admitting: Nurse Practitioner

## 2023-05-28 VITALS — BP 104/68 | HR 76 | Ht 64.0 in | Wt 147.2 lb

## 2023-05-28 DIAGNOSIS — E785 Hyperlipidemia, unspecified: Secondary | ICD-10-CM | POA: Diagnosis not present

## 2023-05-28 DIAGNOSIS — I10 Essential (primary) hypertension: Secondary | ICD-10-CM | POA: Diagnosis not present

## 2023-05-28 DIAGNOSIS — R7303 Prediabetes: Secondary | ICD-10-CM

## 2023-05-28 DIAGNOSIS — I25119 Atherosclerotic heart disease of native coronary artery with unspecified angina pectoris: Secondary | ICD-10-CM | POA: Diagnosis not present

## 2023-05-28 DIAGNOSIS — I5181 Takotsubo syndrome: Secondary | ICD-10-CM

## 2023-05-28 DIAGNOSIS — E871 Hypo-osmolality and hyponatremia: Secondary | ICD-10-CM

## 2023-05-28 DIAGNOSIS — R0989 Other specified symptoms and signs involving the circulatory and respiratory systems: Secondary | ICD-10-CM

## 2023-05-28 DIAGNOSIS — G4733 Obstructive sleep apnea (adult) (pediatric): Secondary | ICD-10-CM

## 2023-05-28 MED ORDER — REPATHA SURECLICK 140 MG/ML ~~LOC~~ SOAJ: 140.0000 mg | SUBCUTANEOUS | 3 refills | Status: AC

## 2023-05-28 NOTE — Patient Instructions (Addendum)
 Medication Instructions:  Your physician recommends that you continue on your current medications as directed. Please refer to the Current Medication list given to you today.  *If you need a refill on your cardiac medications before your next appointment, please call your pharmacy*   Lab Work: NMR Lipid profile today   Testing/Procedures: NONE ordered at this time of appointment     Follow-Up: At [facility], you and your health needs are our priority.  As part of our continuing mission to provide you with exceptional heart care, we have created designated Provider Care Teams.  These Care Teams include your primary Cardiologist (physician) and Advanced Practice Providers (APPs -  Physician Assistants and Nurse Practitioners) who all work together to provide you with the care you need, when you need it.  We recommend signing up for the patient portal called MyChart.  Sign up information is provided on this After Visit Summary.  MyChart is used to connect with patients for Virtual Visits (Telemedicine).  Patients are able to view lab/test results, encounter notes, upcoming appointments, etc.  Non-urgent messages can be sent to your provider as well.   To learn more about what you can do with MyChart, go to [email or web address].    Your next appointment:   1 year(s)  Provider:   Vinie JAYSON Maxcy, MD     Other Instructions

## 2023-05-30 LAB — NMR, LIPOPROFILE
Cholesterol, Total: 131 mg/dL (ref 100–199)
HDL Particle Number: 38.7 umol/L (ref >=30.5)
HDL-C: 45 mg/dL (ref >39)
LDL Particle Number: 492 nmol/L (ref <1000)
LDL Size: 19.9 nmol — ABNORMAL LOW (ref >20.5)
LDL-C (NIH Calc): 45 mg/dL (ref 0–99)
LP-IR Score: 80 — ABNORMAL HIGH (ref <=45)
Small LDL Particle Number: 296 nmol/L (ref <=527)
Triglycerides: 266 mg/dL — ABNORMAL HIGH (ref 0–149)

## 2023-06-04 ENCOUNTER — Encounter: Payer: Self-pay | Admitting: Nurse Practitioner

## 2023-06-04 ENCOUNTER — Ambulatory Visit: Payer: PPO | Admitting: Nurse Practitioner

## 2023-06-04 VITALS — BP 90/60 | HR 84 | Ht 63.0 in | Wt 147.5 lb

## 2023-06-04 DIAGNOSIS — R1010 Upper abdominal pain, unspecified: Secondary | ICD-10-CM

## 2023-06-04 DIAGNOSIS — R110 Nausea: Secondary | ICD-10-CM | POA: Diagnosis not present

## 2023-06-04 NOTE — Patient Instructions (Addendum)
You have been scheduled for an endoscopy. Please follow written instructions given to you at your visit today.  If you use inhalers (even only as needed), please bring them with you on the day of your procedure. _____________________________________________  Please return to our lab if your symptoms recur.  Contact our office if your nausea, upper abdominal and/or upper back pain recurs.  Due to recent changes in healthcare laws, you may see the results of your imaging and laboratory studies on MyChart before your provider has had a chance to review them.  We understand that in some cases there may be results that are confusing or concerning to you. Not all laboratory results come back in the same time frame and the provider may be waiting for multiple results in order to interpret others.  Please give [place] 48 hours in order for your provider to thoroughly review all the results before contacting the office for clarification of your results.   Thank you for trusting me with your gastrointestinal care!   Alcide Evener, CRNP

## 2023-06-04 NOTE — Progress Notes (Unsigned)
06/04/2023 Kayla Deleon [id number] Feb 07, 1951   CHIEF COMPLAINT: Nausea, upper back pain   HISTORY OF PRESENT ILLNESS: Kayla Deleon is a 73 year old female with a past medical history of anxiety, depression, hypertension, hypercholesterolemia, stressed induced cardiomyopathy, nonobstructive CAD per cardiac cath 11/2021, cervical spondylosis, fibroids and obstructive sleep apnea uses Cpap. Cholecystectomy 1980 secondary to large gallstones. She presents to our office today as referred by Dr. Sigmund Hazel for further evaluation regarding nausea. She was previously followed by [facility], dismissed from their office secondary to communication issues. She wishes to transition her GI management to Dr. Rhea Belton. She developed nausea 04/18/2023 and a few days later she developed upper abdominal pain, intermittent pit like discomfort in her stomach.On 12/6 she had sever upper abdominal pain which radiated across her right and left abdomen under the rib cage and wrapped around to the med back and up into her left shoulder with nausea without vomiting therefore she presented to the ED for further evaluation. EKG without acute ischemia. Troponin levels x 2 were normal. Normal CBC, LFTs and lipase level. Admission offered for further cardiology work up, patient elected to follow up with cardiology as an outpatient. She received a GI cocktail, Protonix and Zofran and was discharged home.   She had nausea, lightheadedness, with upper abdominal pain which radiated up into her shoulders, more prominent to the left shoulder. She went to the ED 05/04/2023. Seen by [facility] provider 12/15 who planned on doing an abdominal ultrasound and gastric empty study. In the ED, chest xray was negative. CBC, BMP and troponin levels were normal. Abdominal sono showed post cholecystectomy physiology with a prominent CBD measuring 10mm with evidence of hepatic steatosis.   A gastric empty study was done 06/04/2023 which was normal.  She  denies having any further nausea, upper abdominal pain, back or shoulder pain for the past 2 days. No CP. She has intermittent heartburn, no dysphagia. She took Omeprazole 20mg  every day (not Pantoprazole) for the past 2 weeks. Previously took Famotidine PRN. On ASA 81mg  every day. No other NSAID use. She is passing normal brown stools most days. A few days ago, her stools were lighter brown. No white, black or bloody stools. BM yesterday was normal brown. She endorsed undergoing 3 colonoscopies in her life time which she stated were normal, most recent colonoscopy was 03/30/2016 which showed diverticulosis, no polyps. Repeat colonoscopy in 10 years was recommended.       Latest Ref Rng & Units 05/04/2023   10:52 AM 04/23/2023    6:31 PM 12/13/2021    1:23 AM  CBC  WBC 4.0 - 10.5 K/uL 7.6  7.4  8.1   Hemoglobin 12.0 - 15.0 g/dL 40.9  81.1  91.4   Hematocrit 36.0 - 46.0 % 38.2  36.0  35.5   Platelets 150 - 400 K/uL 275  262  287        Latest Ref Rng & Units 05/04/2023   10:52 AM 04/23/2023    6:31 PM 02/16/2022    9:54 AM  CMP  Glucose 70 - 99 mg/dL 87  782  956   BUN 8 - 23 mg/dL 13  25  20    Creatinine 0.44 - 1.00 mg/dL 2.13  0.86  5.78   Sodium 135 - 145 mmol/L 138  138  141   Potassium 3.5 - 5.1 mmol/L 4.0  4.0  4.3   Chloride 98 - 111 mmol/L 102  103  102  CO2 22 - 32 mmol/L 24  25  26    Calcium 8.9 - 10.3 mg/dL 9.4  9.8  9.8   Total Protein 6.5 - 8.1 g/dL  7.6    Total Bilirubin <1.2 mg/dL  0.3    Alkaline Phos 38 - 126 U/L  48    AST 15 - 41 U/L  19    ALT 0 - 44 U/L  26     Lipase 43 on 04/23/2023  RUQ sono 05/04/2023: FINDINGS: Gallbladder: Surgically absent   Common bile duct: Diameter: 10 mm, prominent   Liver: Increased echogenicity. No focal lesion. Portal vein is patent on color Doppler imaging with normal direction of blood flow towards the liver.   IVC: No abnormality visualized.   Pancreas: Visualized portion unremarkable.   Spleen: Size and appearance  within normal limits.   Right Kidney: Length: 8.9 cm. Echogenicity within normal limits. No mass or hydronephrosis visualized.   Left Kidney: Length: 9.8 cm. Echogenicity within normal limits. No mass or hydronephrosis visualized.   Abdominal aorta: No aneurysm visualized.   Other findings: None.   IMPRESSION: 1. Status post cholecystectomy. 2. Common bile duct is prominent measuring 10 mm. This may be secondary to post cholecystectomy state. Recommend correlation with LFTs. 3. Increased hepatic parenchymal echogenicity suggestive of steatosis.   Gastric empty study 05/07/2023: FINDINGS: Expected location of the stomach in the left upper quadrant. Ingested meal empties the stomach gradually over the course of the study.   71% emptied at 1 hr ( normal >= 10%)   96% emptied at 2 hr ( normal >= 40%)   99% emptied at 3 hr ( normal >= 70%)   IMPRESSION: No scintigraphic evidence of delayed gastric emptying.   Coronary CT with calcium score 12/02/2021: FINDINGS: Non-cardiac: See separate report from [facility]. No significant findings on limited lung and soft tissue windows.   Calcium score: Calcium noted in RCA and LAD   LM 0   LAD 47.9   RCA 81   LCX 0   Total 129 which is 79 th percentile for age/sex   Coronary Arteries: Right dominant with no anomalies   LM: Normal   LAD: 1-24% calcified plaque proximally, 25-49% mixed plaque in mid vessel 25-49% soft plaque distally with mis registration artifact   D1: Small vessel normal   D2: Small vessel normal   Circumflex: Normal   OM1: Normal   OM2: Normal   RCA: 1-24% calcified plaque in mid vessel. 1-24% mixed plaque in distal vessel Significant motion artifact present   PDA: Normal   PLA: Normal   IMPRESSION: 1. Calcium score 129 which is 79 th percentile for age/sex   2.  Normal ascending thoracic aorta 3.2 cm   3.  CAD RADS 2 non obstructive CAD see description above   Past Medical  History:  Diagnosis Date   Anxiety    Cervical spondylosis    evaluated in the past by neurossurgeon   Depression    Fibroids    in the past    Hypercholesteremia    Hypertension    Mental disorder    Sleep apnea    on CPAP   Past Surgical History:  Procedure Laterality Date   CESAREAN SECTION     CHOLECYSTECTOMY     LEFT HEART CATH AND CORONARY ANGIOGRAPHY N/A 12/03/2021   Procedure: LEFT HEART CATH AND CORONARY ANGIOGRAPHY;  Surgeon: Tonny Bollman, MD;  Location: [facility] INVASIVE CV LAB;  Service: Cardiovascular;  Laterality: N/A;  Social History: Married. Retired. Past smoker, smoked cigarettes x 1 year, stopped 48 years ago. She drinks one beer or one glass of wine twice monthly. No drug use.   Family History: Father with history of heart disease and hypertension. Mother had a brain aneurysm. No known family history of esophageal, gastric or colon cancer.    Allergies  Allergen Reactions   Shellfish Allergy Anaphylaxis and Rash    Pt. Uses Epipen. Pt. Uses Epipen.    Fentanyl Other (See Comments)    Confusion, sweating   Percocet [Oxycodone-Acetaminophen] Other (See Comments)    Confusion, AMS   Statins Other (See Comments)    Severe muscle pain      Outpatient Encounter Medications as of 06/04/2023  Medication Sig   aspirin EC ([employer] ASPIRIN LOW DOSE) 81 MG tablet TAKE 1 TABLET (81 MG TOTAL) BY MOUTH DAILY. SWALLOW WHOLE.   augmented betamethasone dipropionate (DIPROLENE-AF) 0.05 % cream Apply 1 Application topically 2 (two) times daily as needed for rash.   buPROPion (WELLBUTRIN SR) 200 MG 12 hr tablet Take 200 mg by mouth daily.   carvedilol (COREG) 6.25 MG tablet TAKE 1 TABLET BY MOUTH TWICE A DAY WITH FOOD   EPINEPHrine 0.3 mg/0.3 mL IJ SOAJ injection Inject 0.3 mg into the muscle daily as needed for anaphylaxis.   escitalopram (LEXAPRO) 20 MG tablet Take 1 tablet (20 mg total) by mouth daily. For depression and anxiety.   Evolocumab (REPATHA SURECLICK) 140 MG/ML  SOAJ Inject 140 mg into the skin every 14 (fourteen) days.   ezetimibe (ZETIA) 10 MG tablet TAKE 1 TABLET BY MOUTH EVERY DAY   famotidine (PEPCID) 40 MG tablet Take 40 mg by mouth as needed for heartburn or indigestion.   isosorbide mononitrate (IMDUR) 60 MG 24 hr tablet TAKE 1 TABLET BY MOUTH EVERY DAY   lamoTRIgine (LAMICTAL) 25 MG tablet Take 50 mg by mouth daily.   losartan (COZAAR) 50 MG tablet TAKE 1 TABLET BY MOUTH EVERYDAY AT BEDTIME   Multiple Vitamin (MULTIVITAMIN) tablet Take 1 tablet by mouth daily.   NEOMYCIN-POLYMYXIN-HYDROCORTISONE (CORTISPORIN) 1 % SOLN OTIC solution Apply 1-2 drops to toe BID after soaking   nitroGLYCERIN (NITROSTAT) 0.4 MG SL tablet Place 1 tablet (0.4 mg total) under the tongue every 5 (five) minutes as needed for chest pain (up to 3 doses. If taking 3rd dose call 911).   Omega-3 Fatty Acids (FISH OIL PO) Take 2 capsules by mouth daily.   omeprazole (PRILOSEC) 40 MG capsule Take 40 mg by mouth daily as needed (acid reflux).   ondansetron (ZOFRAN) 4 MG tablet Take 1 tablet (4 mg total) by mouth every 6 (six) hours.   pantoprazole (PROTONIX) 20 MG tablet Take 1 tablet (20 mg total) by mouth daily for 14 days. (Patient not taking: Reported on 05/28/2023)   spironolactone (ALDACTONE) 25 MG tablet TAKE 1 TABLET (25 MG TOTAL) BY MOUTH DAILY.   No facility-administered encounter medications on file as of 06/04/2023.   REVIEW OF SYSTEMS:  Gen: Denies fever, sweats or chills. No weight loss.  CV: See HPI. Denies chest pain, palpitations or edema. Resp: Denies cough, shortness of breath of hemoptysis.  GI: See HPI.  GU: Denies urinary burning, blood in urine, increased urinary frequency or incontinence. MS: + Back pain. Derm: Denies rash, itchiness, skin lesions or unhealing ulcers. Psych: + Depression and anxiety.  Heme: Denies bruising, easy bleeding. Neuro:  Denies headaches, dizziness or paresthesias. Endo:  Denies any problems with DM, thyroid or adrenal  function.  PHYSICAL EXAM: BP 90/60 (BP Location: Left Arm, Patient Position: Sitting, Cuff Size: Large)   Pulse 84   Ht 5\' 3"  (1.6 m)   Wt 147 lb 8 oz (66.9 kg)   BMI 26.13 kg/m   General: 73 year old female in no acute distress. Head: Normocephalic and atraumatic. Eyes:  Sclerae non-icteric, conjunctive pink. Ears: Normal auditory acuity. Mouth: Dentition intact with invisalign intact. No ulcers or lesions.  Neck: Supple, no lymphadenopathy or thyromegaly.  Lungs: Clear bilaterally to auscultation without wheezes, crackles or rhonchi. Heart: Regular rate and rhythm. No murmur, rub or gallop appreciated.  Abdomen: Soft, nondistended. Mild epigastric tenderness. No masses. No hepatosplenomegaly. Normoactive bowel sounds x 4 quadrants.  Rectal: Deferred.  Musculoskeletal: Symmetrical with no gross deformities. Skin: Warm and dry. No rash or lesions on visible extremities. Extremities: No edema. Neurological: Alert oriented x 4, no focal deficits.  Psychological:  Alert and cooperative. Normal mood and affect.  ASSESSMENT AND PLAN:  73 year old female s/p cholecystectomy due to having large gallstones in 1980 who presents with intermittent nausea without vomiting, epigastric pain which radiates below the right and left rib cage and to the upper back and shoulders. Normal LFTs and lipase level. RUQ sono done in the ED 05/04/2023 showed post cholecystectomy physiology with a prominent CBD measuring 10mm with evidence of hepatic steatosis. Suspect patient is passing micro choledocholithiasis as her symptoms are concerning for biliary etiology. No nausea or upper abd/back/shoulder pain x 2 days.  -Patient to contact our office if symptoms recur -Repeat CBC, CMP and lipase level (orders entered) and stat abdominal MRI/MRCP if symptoms recur -EGD to rule out PUD or other UGI etiology to explain her symptoms, benefits and risks discussed including risk with sedation, risk of bleeding,  perforation and infection  -Patient instructed to go to the ED if she develops severe abd/back/shoulder pain -Avoid fatty/greasy foods -Omeprazole 20mg  QD  Intermittent heartburn -See plan above   Colon cancer screening. No polyps per colonoscopy by [facility] 03/30/2016 -Next screening colonoscopy due 03/2026  Nonobstructive CAD. No angina or SOB.       CC:  Sigmund Hazel, MD

## 2023-06-06 NOTE — Progress Notes (Signed)
Addendum: Reviewed and agree with assessment and management plan. Would proceed with MRI abd with contrast + MRCP now as well given biliary nature of pain.  EGD findings pending, but would like to exclude choledocholithiasis as well Vann Okerlund, Carie Caddy, MD

## 2023-06-07 ENCOUNTER — Other Ambulatory Visit: Payer: Self-pay

## 2023-06-07 ENCOUNTER — Ambulatory Visit: Payer: PPO | Admitting: Cardiology

## 2023-06-07 ENCOUNTER — Other Ambulatory Visit (INDEPENDENT_AMBULATORY_CARE_PROVIDER_SITE_OTHER): Payer: PPO

## 2023-06-07 ENCOUNTER — Telehealth: Payer: Self-pay

## 2023-06-07 ENCOUNTER — Telehealth: Payer: Self-pay | Admitting: Internal Medicine

## 2023-06-07 DIAGNOSIS — M549 Dorsalgia, unspecified: Secondary | ICD-10-CM

## 2023-06-07 DIAGNOSIS — R1010 Upper abdominal pain, unspecified: Secondary | ICD-10-CM

## 2023-06-07 DIAGNOSIS — R110 Nausea: Secondary | ICD-10-CM

## 2023-06-07 LAB — CBC WITH DIFFERENTIAL/PLATELET
Basophils Absolute: 0.1 10*3/uL (ref 0.0–0.1)
Basophils Relative: 1 % (ref 0.0–3.0)
Eosinophils Absolute: 0.1 10*3/uL (ref 0.0–0.7)
Eosinophils Relative: 1 % (ref 0.0–5.0)
HCT: 36.9 % (ref 36.0–46.0)
Hemoglobin: 12.5 g/dL (ref 12.0–15.0)
Lymphocytes Relative: 31.6 % (ref 12.0–46.0)
Lymphs Abs: 3.1 10*3/uL (ref 0.7–4.0)
MCHC: 33.8 g/dL (ref 30.0–36.0)
MCV: 93.5 fL (ref 78.0–100.0)
Monocytes Absolute: 1 10*3/uL (ref 0.1–1.0)
Monocytes Relative: 10.4 % (ref 3.0–12.0)
Neutro Abs: 5.4 10*3/uL (ref 1.4–7.7)
Neutrophils Relative %: 56 % (ref 43.0–77.0)
Platelets: 288 10*3/uL (ref 150.0–400.0)
RBC: 3.94 Mil/uL (ref 3.87–5.11)
RDW: 12.6 % (ref 11.5–15.5)
WBC: 9.7 10*3/uL (ref 4.0–10.5)

## 2023-06-07 LAB — LIPASE: Lipase: 68 U/L — ABNORMAL HIGH (ref 11.0–59.0)

## 2023-06-07 LAB — COMPREHENSIVE METABOLIC PANEL
ALT: 28 U/L (ref 0–35)
AST: 19 U/L (ref 0–37)
Albumin: 4.4 g/dL (ref 3.5–5.2)
Alkaline Phosphatase: 51 U/L (ref 39–117)
BUN: 26 mg/dL — ABNORMAL HIGH (ref 6–23)
CO2: 26 meq/L (ref 19–32)
Calcium: 9.6 mg/dL (ref 8.4–10.5)
Chloride: 101 meq/L (ref 96–112)
Creatinine, Ser: 1.07 mg/dL (ref 0.40–1.20)
GFR: 52.64 mL/min — ABNORMAL LOW (ref >60.00)
Glucose, Bld: 92 mg/dL (ref 70–99)
Potassium: 4.4 meq/L (ref 3.5–5.1)
Sodium: 136 meq/L (ref 135–145)
Total Bilirubin: 0.3 mg/dL (ref 0.2–1.2)
Total Protein: 7.5 g/dL (ref 6.0–8.3)

## 2023-06-07 NOTE — Telephone Encounter (Signed)
Called patient in reference to wanting to know if she should cancel her procedure due to her daughter having Covid. Patient was informed if anyone in the family shows signs of fever, cough, complaints of chest pain or sob to notify the physician as well as notifying [facility] to cancel the appt if necessary. Patient states she nor other family members have no complaints and are not feverish. She also states all the members in the family wear masks, including the patient with Covid. Informed the patient she should make sure she cleans the bathroom with Clorox and water if all members are using the same bathroom. At this time she can maintain her appt. Patient agreed.

## 2023-06-07 NOTE — Progress Notes (Signed)
Kayla Deleon, please contact the patient and let her know I discussed her case with Dr. Rhea Belton, she should proceed with the upper endoscopy as scheduled.  At the time of her office appointment, I discussed scheduling an abdominal MRI/MRCP if she had recurrent upper abdominal/back/shoulder pain.  Please let her know Dr. Rhea Belton would like her to undergo an abdominal MRI/MRCP at this time.  Please schedule her for an abdominal MRI/MRCP with and without contrast.  Send her to our lab for the CBC, CMP and lipase level, orders were already entered as I initially nformed the patient to get these labs done if her symptoms recurred but go ahead and send her to her lab and get those done now as well.  Thank you

## 2023-06-07 NOTE — Telephone Encounter (Signed)
Message Received: Today Arnaldo Natal, NP  Emeline Darling, RN      Previous Messages  Routed Note  Author: Arnaldo Natal, NP Service: Gastroenterology Author Type: Nurse Practitioner  Filed: 06/07/2023  9:45 AM Encounter Date: 06/04/2023 Status: Signed  Editor: Arnaldo Natal, NP (Nurse Practitioner)   Viviann Spare, please contact the patient and let her know I discussed her case with Dr. Rhea Belton, she should proceed with the upper endoscopy as scheduled.  At the time of her office appointment, I discussed scheduling an abdominal MRI/MRCP if she had recurrent upper abdominal/back/shoulder pain.  Please let her know Dr. Rhea Belton would like her to undergo an abdominal MRI/MRCP at this time.  Please schedule her for an abdominal MRI/MRCP with and without contrast.  Send her to our lab for the CBC, CMP and lipase level, orders were already entered as I initially nformed the patient to get these labs done if her symptoms recurred but go ahead and send her to her lab and get those done now as well.  Thank you     Office Visit for Nausea; Abdominal Pain 06/04/2023 Arnaldo Natal, NP - [facility] Diagnoses  Upper Abdominal Pain (Primary) Nausea without vomiting Orders Signed This Visit  (4) Lipase Comp Met (CMET) CBC w/Diff Ambulatory referral to Gastroenterology Orders Pended This Visit  None Progress Notes  Arnaldo Natal, NP at 06/04/2023  8:30 AM  Status: Signed  Viviann Spare, please contact the patient and let her know I discussed her case with Dr. Rhea Belton, she should proceed with the upper endoscopy as scheduled.  At the time of her office appointment, I discussed scheduling an abdominal MRI/MRCP if she had recurrent upper abdominal/back/shoulder pain.  Please let her know Dr. Rhea Belton would like her to undergo an abdominal MRI/MRCP at this time.  Please schedule her for an abdominal MRI/MRCP with and without contrast.  Send  her to our lab for the CBC, CMP and lipase level, orders were already entered as I initially nformed the patient to get these labs done if her symptoms recurred but go ahead and send her to her lab and get those done now as well.  Thank you    Pyrtle, Carie Caddy, MD at 06/04/2023  8:30 AM  Status: Signed  Addendum: Reviewed and agree with assessment and management plan. Would proceed with MRI abd with contrast + MRCP now as well given biliary nature of pain.  EGD findings pending, but would like to exclude choledocholithiasis as well Pyrtle, Carie Caddy, MD

## 2023-06-07 NOTE — Telephone Encounter (Signed)
Pt made aware of Kayla Evener NP and Kayla Deleon recommendations: Pt was scheduled for the MRI/MRCP on 06/10/2023 at [facility] at 4:00 PM. Pt to arrive at 3:30 PM. Nothing to eat or drink four hours prior. Pt made aware. Pt notified that orders for labs were in and location to lab provided.  Pt verbalized understanding with all questions answered.

## 2023-06-07 NOTE — Telephone Encounter (Signed)
Patient called.  She's having an EGD 1/22.  Her daughter came home this weekend with COVID and has been quarantined.  She said others in her family are showing no symptoms as of yet and wanted to know what she should do about her procedure.  Please call patient and advise.

## 2023-06-08 ENCOUNTER — Ambulatory Visit: Payer: PPO | Admitting: Physician Assistant

## 2023-06-08 ENCOUNTER — Encounter: Payer: Self-pay | Admitting: Certified Registered Nurse Anesthetist

## 2023-06-08 ENCOUNTER — Telehealth: Payer: Self-pay

## 2023-06-08 NOTE — Telephone Encounter (Signed)
Spoke with pt. Pt was notified of lab results. Pt will continue current medications and f/u as planned. Discussed dietary modifications with pt. Pt voiced understanding.

## 2023-06-08 NOTE — Telephone Encounter (Signed)
Linda see note from [place] regarding patient's exposure to COVID Can you please check on her today if any symptoms of URI including fever then EGD should be rescheduled; otherwise okay to proceed

## 2023-06-08 NOTE — Telephone Encounter (Signed)
Pt states she is not having any symptoms and she will keep her appt as scheduled.

## 2023-06-09 ENCOUNTER — Ambulatory Visit (AMBULATORY_SURGERY_CENTER): Payer: PPO | Admitting: Internal Medicine

## 2023-06-09 ENCOUNTER — Encounter: Payer: Self-pay | Admitting: Internal Medicine

## 2023-06-09 VITALS — BP 144/75 | HR 61 | Temp 98.1°F | Resp 15 | Ht 63.0 in | Wt 147.0 lb

## 2023-06-09 DIAGNOSIS — R110 Nausea: Secondary | ICD-10-CM

## 2023-06-09 DIAGNOSIS — K2980 Duodenitis without bleeding: Secondary | ICD-10-CM | POA: Diagnosis not present

## 2023-06-09 DIAGNOSIS — K3189 Other diseases of stomach and duodenum: Secondary | ICD-10-CM | POA: Diagnosis not present

## 2023-06-09 DIAGNOSIS — K449 Diaphragmatic hernia without obstruction or gangrene: Secondary | ICD-10-CM

## 2023-06-09 DIAGNOSIS — R1010 Upper abdominal pain, unspecified: Secondary | ICD-10-CM

## 2023-06-09 MED ORDER — SODIUM CHLORIDE 0.9 % IV SOLN: 500.0000 mL | Freq: Once | INTRAVENOUS | Status: DC

## 2023-06-09 NOTE — Op Note (Signed)
[facility] Patient Name: Kayla Deleon Procedure Date: 06/09/2023 9:38 AM MRN: [id number] Endoscopist: Beverley Fiedler , MD, [id number] Age: 73 Referring MD:  Date of Birth: 03/07/1951 Gender: Female Account #: [id number] Procedure:                Upper GI endoscopy Indications:              Epigastric abdominal pain, Heartburn, Nausea Medicines:                Monitored Anesthesia Care Procedure:                Pre-Anesthesia Assessment:                           - Prior to the procedure, a History and Physical                            was performed, and patient medications and                            allergies were reviewed. The patient's tolerance of                            previous anesthesia was also reviewed. The risks                            and benefits of the procedure and the sedation                            options and risks were discussed with the patient.                            All questions were answered, and informed consent                            was obtained. Prior Anticoagulants: The patient has                            taken no anticoagulant or antiplatelet agents. ASA                            Grade Assessment: II - A patient with mild systemic                            disease. After reviewing the risks and benefits,                            the patient was deemed in satisfactory condition to                            undergo the procedure.                           After obtaining informed consent, the endoscope was  passed under direct vision. Throughout the                            procedure, the patient's blood pressure, pulse, and                            oxygen saturations were monitored continuously. The                            GIF [id number] #[id number] was introduced through the                            mouth, and advanced to the second part of duodenum.                            The upper GI  endoscopy was accomplished without                            difficulty. The patient tolerated the procedure                            well. Scope In: Scope Out: Findings:                 Normal mucosa was found in the entire esophagus.                           A small hiatal hernia was present.                           The entire examined stomach was normal. Biopsies                            were taken with a cold forceps for Helicobacter                            pylori testing.                           Localized mild inflammation characterized by                            congestion (edema) and erythema was found in the                            duodenal bulb.                           The second portion of the duodenum was normal. Complications:            No immediate complications. Estimated Blood Loss:     Estimated blood loss was minimal. Impression:               - Normal mucosa was found in the entire esophagus.                           -  Small hiatal hernia.                           - Normal stomach. Biopsied.                           - Duodenitis.                           - Normal second portion of the duodenum. Recommendation:           - Patient has a contact number available for                            emergencies. The signs and symptoms of potential                            delayed complications were discussed with the                            patient. Return to normal activities tomorrow.                            Written discharge instructions were provided to the                            patient.                           - Resume previous diet.                           - Continue present medications.                           - Await pathology results.                           - Follow-up MRI abd with MRCP. Beverley Fiedler, MD 06/09/2023 10:02:20 AM This report has been signed electronically.

## 2023-06-09 NOTE — Progress Notes (Signed)
Called to room to assist during endoscopic procedure.  Patient ID and intended procedure confirmed with present staff. Received instructions for my participation in the procedure from the performing physician.  

## 2023-06-09 NOTE — Progress Notes (Signed)
Report given to PACU, vss 

## 2023-06-09 NOTE — Patient Instructions (Signed)
-   Resume previous diet. - Continue present medications. - Await pathology results. - Follow-up MRI abd with MRCP.   YOU HAD AN ENDOSCOPIC PROCEDURE TODAY AT THE [facility]:   Refer to the procedure report that was given to you for any specific questions about what was found during the examination.  If the procedure report does not answer your questions, please call your gastroenterologist to clarify.  If you requested that your care partner not be given the details of your procedure findings, then the procedure report has been included in a sealed envelope for you to review at your convenience later.  YOU SHOULD EXPECT: Some feelings of bloating in the abdomen. Passage of more gas than usual.  Walking can help get rid of the air that was put into your GI tract during the procedure and reduce the bloating. If you had a lower endoscopy (such as a colonoscopy or flexible sigmoidoscopy) you may notice spotting of blood in your stool or on the toilet paper. If you underwent a bowel prep for your procedure, you may not have a normal bowel movement for a few days.  Please Note:  You might notice some irritation and congestion in your nose or some drainage.  This is from the oxygen used during your procedure.  There is no need for concern and it should clear up in a day or so.  SYMPTOMS TO REPORT IMMEDIATELY:  Following upper endoscopy (EGD)  Vomiting of blood or coffee ground material  New chest pain or pain under the shoulder blades  Painful or persistently difficult swallowing  New shortness of breath  Fever of 100F or higher  Black, tarry-looking stools  For urgent or emergent issues, a gastroenterologist can be reached at any hour by calling [phone]. Do not use MyChart messaging for urgent concerns.    DIET:  We do recommend a small meal at first, but then you may proceed to your regular diet.  Drink plenty of fluids but you should avoid alcoholic beverages for 24  hours.  ACTIVITY:  You should plan to take it easy for the rest of today and you should NOT DRIVE or use heavy machinery until tomorrow (because of the sedation medicines used during the test).    FOLLOW UP: Our staff will call the number listed on your records the next business day following your procedure.  We will call around 7:15- 8:00 am to check on you and address any questions or concerns that you may have regarding the information given to you following your procedure. If we do not reach you, we will leave a message.     If any biopsies were taken you will be contacted by phone or by letter within the next 1-3 weeks.  Please call us at [phone] if you have not heard about the biopsies in 3 weeks.    SIGNATURES/CONFIDENTIALITY: You and/or your care partner have signed paperwork which will be entered into your electronic medical record.  These signatures attest to the fact that that the information above on your After Visit Summary has been reviewed and is understood.  Full responsibility of the confidentiality of this discharge information lies with you and/or your care-partner.

## 2023-06-09 NOTE — Progress Notes (Signed)
0948 Robinul 0.1 mg IV given due large amount of secretions upon assessment.  MD made aware, vss  

## 2023-06-09 NOTE — Progress Notes (Signed)
See office note dated 06/04/2023 for details and current H&P  Patient presenting for upper endoscopy in the [facility] today to evaluate nausea without vomiting and epigastric pain, intermittent heartburn  She remains appropriate for EGD in the [facility] today.

## 2023-06-10 ENCOUNTER — Ambulatory Visit (HOSPITAL_COMMUNITY)
Admission: RE | Admit: 2023-06-10 | Discharge: 2023-06-10 | Disposition: A | Discharge status: Home / Self Care | Payer: PPO | Source: Ambulatory Visit | Attending: Nurse Practitioner | Admitting: Nurse Practitioner

## 2023-06-10 ENCOUNTER — Telehealth: Payer: Self-pay

## 2023-06-10 DIAGNOSIS — R110 Nausea: Secondary | ICD-10-CM | POA: Diagnosis present

## 2023-06-10 DIAGNOSIS — M549 Dorsalgia, unspecified: Secondary | ICD-10-CM | POA: Diagnosis present

## 2023-06-10 DIAGNOSIS — R1010 Upper abdominal pain, unspecified: Secondary | ICD-10-CM | POA: Diagnosis present

## 2023-06-10 MED ORDER — GADOBUTROL 1 MMOL/ML IV SOLN
7.0000 mL | Freq: Once | INTRAVENOUS | Status: AC | PRN
  Administered 2023-06-10: 7 mL via INTRAVENOUS

## 2023-06-10 NOTE — Telephone Encounter (Signed)
  Follow up Call-     06/09/2023    8:40 AM  Call back number  Post procedure Call Back phone  # [phone]  Permission to leave phone message Yes     Patient questions:  Do you have a fever, pain , or abdominal swelling? No. Pain Score  0 *  Have you tolerated food without any problems? Yes.    Have you been able to return to your normal activities? Yes.    Do you have any questions about your discharge instructions: Diet   No. Medications  No. Follow up visit  No.  Do you have questions or concerns about your Care? No.  Actions: * If pain score is 4 or above: No action needed, pain <4.

## 2023-06-11 LAB — SURGICAL PATHOLOGY

## 2023-06-15 ENCOUNTER — Encounter: Payer: Self-pay | Admitting: Internal Medicine

## 2023-06-16 ENCOUNTER — Other Ambulatory Visit: Payer: Self-pay | Admitting: Nurse Practitioner

## 2023-06-16 DIAGNOSIS — M549 Dorsalgia, unspecified: Secondary | ICD-10-CM

## 2023-06-16 DIAGNOSIS — R110 Nausea: Secondary | ICD-10-CM

## 2023-06-16 DIAGNOSIS — R1010 Upper abdominal pain, unspecified: Secondary | ICD-10-CM

## 2023-06-28 ENCOUNTER — Telehealth: Payer: Self-pay | Admitting: Nurse Practitioner

## 2023-06-28 NOTE — Telephone Encounter (Signed)
 DD, please enter recall screening colonoscopy due 03/2026.  Patient underwent a colonoscopy 03/30/2016 by Dr. Denece Finger at [facility] which showed diverticulosis to the sigmoid colon, no polyps. Records to be scanned.

## 2023-07-01 NOTE — Telephone Encounter (Signed)
Recall was entered

## 2023-07-12 NOTE — Addendum Note (Signed)
 Encounter addended by: Caffie Pinto on: 07/12/2023 4:15 PM  Actions taken: Imaging Exam ended

## 2023-07-30 ENCOUNTER — Telehealth: Payer: Self-pay | Admitting: Nurse Practitioner

## 2023-07-30 NOTE — Telephone Encounter (Signed)
 Contacted patient and patient is scheduled with Colleen for 4/28 at 3 pm.

## 2023-07-30 NOTE — Telephone Encounter (Signed)
 DD, pls contact patient and schedule her for a follow up appointment since she has completed the EGD and abdominal MRI. Recommend follow up to verify if she's had any further upper abdominal or back pain and to discuss mild fatty liver finding seen per MRI. THX.

## 2023-08-15 ENCOUNTER — Other Ambulatory Visit: Payer: Self-pay | Admitting: Internal Medicine

## 2023-08-17 ENCOUNTER — Other Ambulatory Visit: Payer: Self-pay | Admitting: Cardiovascular Disease

## 2023-09-13 ENCOUNTER — Encounter: Payer: Self-pay | Admitting: Nurse Practitioner

## 2023-09-13 ENCOUNTER — Ambulatory Visit: Admitting: Nurse Practitioner

## 2023-09-13 VITALS — BP 102/62 | HR 72 | Ht 63.0 in | Wt 144.0 lb

## 2023-09-13 DIAGNOSIS — R110 Nausea: Secondary | ICD-10-CM

## 2023-09-13 NOTE — Progress Notes (Signed)
 09/13/2023 Kayla Deleon [id number] 1951-05-10   Chief Complaint: Nausea, follow up after upper endoscopy   History of Present Illness: Kayla Deleon is a 73 year old female with a past medical history of anxiety, depression, hypertension, hypercholesterolemia, stressed induced cardiomyopathy, nonobstructive CAD per cardiac cath 11/2021, cervical spondylosis, fibroids and obstructive sleep apnea uses Cpap. Cholecystectomy 1980 secondary to large gallstones.  I initially saw Betsey Gerstle in office 06/04/2023 for further evaluation regarding nausea and epigastric fullness.  She also noted having severe upper abdominal pain which radiated across her upper abdomen under the right and left rib cages to her mid back which required her ED evaluation which was unrevealing.  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 pylori.  She underwent an abdominal MRI/MRCP with and without contrast 06/10/2023 which showed stable biliary ductal dilatation s/p cholecystectomy, CBD measuring up to 1 cm in caliber tapering smoothly to the ampulla and hepatic steatosis.  She presents today for further follow-up.  She stated her nausea and epigastric heaviness abated 2 weeks prior to undergoing an EGD and MRI.  She stated she feels quite well.  Appetite is good and weight is stable.  She has infrequent heartburn for which she takes Pepcid 40 mg as needed, as taken twice in the past month.  She is not taking Omeprazole .  No lower abdominal pain.  She is passing normal formed brown bowel movement daily.  She endorsed undergoing 3 colonoscopies in her life time which she stated were normal, most recent colonoscopy was 03/30/2016 which showed diverticulosis, no polyps. Repeat colonoscopy in 10 years was recommended.        Latest Ref Rng & Units 06/07/2023     1:40 PM 05/04/2023   10:52 AM 04/23/2023    6:31 PM  CBC  WBC 4.0 - 10.5 K/uL 9.7  7.6  7.4   Hemoglobin 12.0 - 15.0 g/dL 40.9  81.1  91.4   Hematocrit 36.0 - 46.0 % 36.9  38.2  36.0   Platelets 150.0 - 400.0 K/uL 288.0  275  262        Latest Ref Rng & Units 06/07/2023    1:40 PM 05/04/2023   10:52 AM 04/23/2023    6:31 PM  CMP  Glucose 70 - 99 mg/dL 92  87  782   BUN 6 - 23 mg/dL 26  13  25    Creatinine 0.40 - 1.20 mg/dL 9.56  2.13  0.86   Sodium 135 - 145 mEq/L 136  138  138   Potassium 3.5 - 5.1 mEq/L 4.4  4.0  4.0   Chloride 96 - 112 mEq/L 101  102  103   CO2 19 - 32 mEq/L 26  24  25    Calcium  8.4 - 10.5 mg/dL 9.6  9.4  9.8   Total Protein 6.0 - 8.3 g/dL 7.5   7.6   Total Bilirubin 0.2 - 1.2 mg/dL 0.3   0.3   Alkaline Phos 39 - 117 U/L 51   48   AST 0 - 37 U/L 19   19   ALT 0 - 35 U/L 28   26     Abdominal MR/MRCP with and without contrast 06/10/2023:  FINDINGS: Lower chest: No acute abnormality.   Hepatobiliary: Benign flash filling hemangioma of the posterior inferior right lobe of the liver, hepatic  segment VI measuring 1.7 x 1.2 cm (series 5, image 60). Mild hepatic steatosis. Status post cholecystectomy. Unchanged biliary ductal dilatation, common bile duct measuring up to 1.0 cm in caliber, tapering smoothly to the ampulla without calculus or other obstruction.   Pancreas: Unremarkable. No pancreatic ductal dilatation or surrounding inflammatory changes.   Spleen: Normal in size without significant abnormality.   Adrenals/Urinary Tract: Adrenal glands are unremarkable. Kidneys are normal, without renal calculi, solid lesion, or hydronephrosis.   Stomach/Bowel: Stomach is within normal limits. No evidence of bowel wall thickening, distention, or inflammatory changes.   Vascular/Lymphatic: No significant vascular findings are present. No enlarged abdominal lymph nodes.   Other: No abdominal wall hernia or abnormality. No ascites.   Musculoskeletal: No  acute or significant osseous findings.   IMPRESSION: 1. Unchanged biliary ductal dilatation status post cholecystectomy, common bile duct measuring up to 1.0 cm in caliber, tapering smoothly to the ampulla without calculus or other obstruction. 2. Mild hepatic steatosis.  RUQ sono 05/04/2023: FINDINGS: Gallbladder: Surgically absent   Common bile duct: Diameter: 10 mm, prominent   Liver: Increased echogenicity. No focal lesion. Portal vein is patent on color Doppler imaging with normal direction of blood flow towards the liver.   IVC: No abnormality visualized.   Pancreas: Visualized portion unremarkable.   Spleen: Size and appearance within normal limits.   Right Kidney: Length: 8.9 cm. Echogenicity within normal limits. No mass or hydronephrosis visualized.   Left Kidney: Length: 9.8 cm. Echogenicity within normal limits. No mass or hydronephrosis visualized.   Abdominal aorta: No aneurysm visualized.   Other findings: None.   IMPRESSION: 1. Status post cholecystectomy. 2. Common bile duct is prominent measuring 10 mm. This may be secondary to post cholecystectomy state. Recommend correlation with LFTs. 3. Increased hepatic parenchymal echogenicity suggestive of steatosis.   Gastric empty study 05/07/2023: FINDINGS: Expected location of the stomach in the left upper quadrant. Ingested meal empties the stomach gradually over the course of the study.   71% emptied at 1 hr ( normal >= 10%)   96% emptied at 2 hr ( normal >= 40%)   99% emptied at 3 hr ( normal >= 70%)   IMPRESSION: No scintigraphic evidence of delayed gastric emptying.   Coronary CT with calcium  score 12/02/2021: FINDINGS: Non-cardiac: See separate report from [facility]. No significant findings on limited lung and soft tissue windows.   IMPRESSION: 1. Calcium  score 129 which is 79 th percentile for age/sex   2.  Normal ascending thoracic aorta 3.2 cm   3.  CAD RADS 2 non  obstructive CAD    GI PROCEDURES:   EGD 06/09/2023: - Normal mucosa was found in the entire esophagus.  - Small hiatal hernia.  - Normal stomach. Biopsied.  - Duodenitis.  - Normal second portion of the duodenum   1. Surgical [P], gastric antrum and gastric body :       - GASTRIC ANTRAL MUCOSA WITH MILD NONSPECIFIC REACTIVE GASTROPATHY       - GASTRIC OXYNTIC MUCOSA WITH PARIETAL CELL HYPERPLASIA AS CAN BE SEEN IN       HYPERGASTRINEMIC STATES SUCH AS PPI THERAPY.      - HELICOBACTER PYLORI-LIKE ORGANISMS ARE NOT IDENTIFIED ON ROUTINE H&E STAIN   Current Outpatient Medications on File Prior to Visit  Medication Sig Dispense Refill   aspirin  EC ([employer] ASPIRIN  LOW DOSE) 81 MG tablet TAKE 1 TABLET (81 MG TOTAL) BY MOUTH DAILY. SWALLOW WHOLE. 90 tablet 1   augmented betamethasone   dipropionate (DIPROLENE -AF) 0.05 % cream Apply 1 Application topically 2 (two) times daily as needed for rash.     buPROPion  (WELLBUTRIN  SR) 200 MG 12 hr tablet Take 200 mg by mouth daily.     carvedilol  (COREG ) 6.25 MG tablet TAKE 1 TABLET BY MOUTH TWICE A DAY WITH FOOD 180 tablet 3   dimenhyDRINATE (DRAMAMINE PO) Take 1 tablet by mouth daily.     EPINEPHrine 0.3 mg/0.3 mL IJ SOAJ injection Inject 0.3 mg into the muscle daily as needed for anaphylaxis.     escitalopram  (LEXAPRO ) 20 MG tablet Take 1 tablet (20 mg total) by mouth daily. For depression and anxiety. 30 tablet 0   Evolocumab  (REPATHA  SURECLICK) 140 MG/ML SOAJ Inject 140 mg into the skin every 14 (fourteen) days. 6 mL 3   ezetimibe  (ZETIA ) 10 MG tablet TAKE 1 TABLET BY MOUTH EVERY DAY 90 tablet 3   famotidine (PEPCID) 40 MG tablet Take 40 mg by mouth as needed for heartburn or indigestion.     isosorbide  mononitrate (IMDUR ) 60 MG 24 hr tablet TAKE 1 TABLET BY MOUTH EVERY DAY 90 tablet 3   lamoTRIgine  (LAMICTAL ) 25 MG tablet Take 50 mg by mouth daily.     losartan  (COZAAR ) 50 MG tablet TAKE 1 TABLET BY MOUTH EVERYDAY AT BEDTIME 90 tablet 3   Multiple  Vitamin (MULTIVITAMIN) tablet Take 1 tablet by mouth daily.     nitroGLYCERIN  (NITROSTAT ) 0.4 MG SL tablet Place 1 tablet (0.4 mg total) under the tongue every 5 (five) minutes as needed for chest pain (up to 3 doses. If taking 3rd dose call 911). 25 tablet 3   Omega-3 Fatty Acids (FISH OIL PO) Take 2 capsules by mouth daily.     omeprazole  (PRILOSEC OTC) 20 MG tablet Take 20 mg by mouth daily. Pt taking as needed     spironolactone  (ALDACTONE ) 25 MG tablet TAKE 1 TABLET (25 MG TOTAL) BY MOUTH DAILY. 90 tablet 3   No current facility-administered medications on file prior to visit.   Allergies  Allergen Reactions   Shellfish Allergy Anaphylaxis and Rash    Pt. Uses Epipen. Pt. Uses Epipen.    Fentanyl  Other (See Comments)    Confusion, sweating   Metronidazole Hcl Hives and Rash   Percocet [Oxycodone-Acetaminophen ] Other (See Comments)    Confusion, AMS   Statins Other (See Comments)    Severe muscle pain   Current Medications, Allergies, Past Medical History, Past Surgical History, Family History and Social History were reviewed in [employer] record.  Review of Systems:   Constitutional: Negative for fever, sweats, chills or weight loss.  Respiratory: Negative for shortness of breath.   Cardiovascular: Negative for chest pain, palpitations and leg swelling.  Gastrointestinal: See HPI.  Musculoskeletal: Negative for back pain or muscle aches.  Neurological: Negative for dizziness, headaches or paresthesias.   Physical Exam: Ht 5\' 3"  (1.6 m)   Wt 144 lb (65.3 kg)   BMI 25.51 kg/m  Wt Readings from Last 3 Encounters:  09/13/23 144 lb (65.3 kg)  06/09/23 147 lb (66.7 kg)  06/04/23 147 lb 8 oz (66.9 kg)    General: 73 year old female in no acute distress. Head: Normocephalic and atraumatic. Eyes: No scleral icterus. Conjunctiva pink . Ears: Normal auditory acuity. Lungs: Clear throughout to auscultation. Heart: Regular rate and rhythm, no  murmur. Abdomen: Soft, nontender and nondistended. No masses or hepatomegaly. Normal bowel sounds x 4 quadrants.  Rectal: Deferred. Musculoskeletal: Symmetrical with no gross deformities. Extremities:  No edema. Neurological: Alert oriented x 4. No focal deficits.  Psychological: Alert and cooperative. Normal mood and affect  Assessment and Recommendations:  73 year old female with nausea, epigastric fullness/pain which abated 2 weeks prior to undergoing an EGD and abdominal MRI/MRCP. No further episodes of upper abdominal pain/back pain. EGD 06/09/2023 showed a small hiatal hernia, mild nonspecific reactive gastropathy, gastric oxyntic mucosa with parietal cell hyperplasia secondary to PPI therapy and duodenitis without evidence of H. pylori. MRI/MRCP 06/10/2023 which showed stable biliary ductal dilatation s/p cholecystectomy, CBD measuring up to 1 cm in caliber tapering smoothly to the ampulla.  - Patient to contact office if nausea or upper abdominal or mid back pain recurs  GERD symptoms. Heartburn twice monthly, relieved by taking Famotidine PRN.  EGD 06/09/2023 showed a normal esophagus, small hiatal hernia, specific reactive gastropathy and duodenitis without evidence of H. pylori. -Famotidine 40 mg daily as needed -GERD diet  Hepatic steatosis per abdominal MRI - Avoid weight gain -Check LFTs annually with PCP  Colon cancer screening. No history of colon polyps. Colonoscopy was 03/30/2016 showed diverticulosis, no polyps.  No known family history of colon polyps or colorectal cancer. -Next screening colonoscopy due 03/2026

## 2023-09-13 NOTE — Patient Instructions (Addendum)
 Next colonocopy due November 2027  Continue Famotidine 40 mg daily as needed.  Follow up as needed.  Due to recent changes in healthcare laws, you may see the results of your imaging and laboratory studies on MyChart before your provider has had a chance to review them.  We understand that in some cases there may be results that are confusing or concerning to you. Not all laboratory results come back in the same time frame and the provider may be waiting for multiple results in order to interpret others.  Please give us  48 hours in order for your provider to thoroughly review all the results before contacting the office for clarification of your results.    Thank you for trusting me with your gastrointestinal care!   Everett Hitt, CRNP

## 2023-10-07 NOTE — Progress Notes (Signed)
 Addendum: Reviewed and agree with assessment and management plan. In the absence of recurrent biliary type symptoms or elevated liver enzymes I do not think MRI/MRCP is necessary given physiologic postcholecystectomy bile duct dilation Billey Wojciak, Amber Bail, MD

## 2023-11-10 DIAGNOSIS — H43811 Vitreous degeneration, right eye: Secondary | ICD-10-CM | POA: Diagnosis not present

## 2023-11-10 DIAGNOSIS — H2513 Age-related nuclear cataract, bilateral: Secondary | ICD-10-CM | POA: Diagnosis not present

## 2023-11-10 DIAGNOSIS — H04123 Dry eye syndrome of bilateral lacrimal glands: Secondary | ICD-10-CM | POA: Diagnosis not present

## 2023-11-10 DIAGNOSIS — H35373 Puckering of macula, bilateral: Secondary | ICD-10-CM | POA: Diagnosis not present

## 2023-11-12 ENCOUNTER — Other Ambulatory Visit: Payer: Self-pay | Admitting: Cardiovascular Disease

## 2023-11-30 DIAGNOSIS — F411 Generalized anxiety disorder: Secondary | ICD-10-CM | POA: Diagnosis not present

## 2023-11-30 DIAGNOSIS — F332 Major depressive disorder, recurrent severe without psychotic features: Secondary | ICD-10-CM | POA: Diagnosis not present

## 2023-12-03 ENCOUNTER — Encounter: Payer: Self-pay | Admitting: Internal Medicine

## 2023-12-03 NOTE — Telephone Encounter (Signed)
 Pt notified of Dr. Katharina comments via MyChart. Last read by Reda Georgina Ruth at 5:41PM on 12/03/2023.

## 2023-12-08 DIAGNOSIS — R0981 Nasal congestion: Secondary | ICD-10-CM | POA: Diagnosis not present

## 2023-12-08 DIAGNOSIS — R197 Diarrhea, unspecified: Secondary | ICD-10-CM | POA: Diagnosis not present

## 2023-12-08 DIAGNOSIS — R52 Pain, unspecified: Secondary | ICD-10-CM | POA: Diagnosis not present

## 2023-12-08 DIAGNOSIS — R051 Acute cough: Secondary | ICD-10-CM | POA: Diagnosis not present

## 2024-03-15 ENCOUNTER — Other Ambulatory Visit (HOSPITAL_BASED_OUTPATIENT_CLINIC_OR_DEPARTMENT_OTHER): Payer: Self-pay

## 2024-03-15 MED ORDER — COMIRNATY 30 MCG/0.3ML IM SUSY
0.3000 mL | PREFILLED_SYRINGE | Freq: Once | INTRAMUSCULAR | 0 refills | Status: AC
  Filled 2024-03-15: qty 0.3, 1d supply, fill #0

## 2024-03-31 ENCOUNTER — Other Ambulatory Visit: Payer: Self-pay | Admitting: Family Medicine

## 2024-03-31 DIAGNOSIS — Z1231 Encounter for screening mammogram for malignant neoplasm of breast: Secondary | ICD-10-CM

## 2024-04-28 ENCOUNTER — Ambulatory Visit
Admission: RE | Admit: 2024-04-28 | Discharge: 2024-04-28 | Disposition: A | Source: Ambulatory Visit | Attending: Family Medicine | Admitting: Family Medicine

## 2024-04-28 ENCOUNTER — Ambulatory Visit

## 2024-04-28 DIAGNOSIS — Z1231 Encounter for screening mammogram for malignant neoplasm of breast: Secondary | ICD-10-CM

## 2024-05-01 ENCOUNTER — Emergency Department (HOSPITAL_COMMUNITY)

## 2024-05-01 ENCOUNTER — Ambulatory Visit (HOSPITAL_COMMUNITY): Admission: EM | Admit: 2024-05-01 | Discharge: 2024-05-01 | Disposition: A | Discharge status: Home / Self Care

## 2024-05-01 ENCOUNTER — Other Ambulatory Visit: Payer: Self-pay

## 2024-05-01 ENCOUNTER — Encounter (HOSPITAL_COMMUNITY): Payer: Self-pay | Admitting: *Deleted

## 2024-05-01 ENCOUNTER — Emergency Department (HOSPITAL_COMMUNITY)
Admission: EM | Admit: 2024-05-01 | Discharge: 2024-05-01 | Disposition: A | Discharge status: Home / Self Care | Source: Ambulatory Visit | Attending: Emergency Medicine | Admitting: Emergency Medicine

## 2024-05-01 ENCOUNTER — Encounter (HOSPITAL_COMMUNITY): Payer: Self-pay | Admitting: Emergency Medicine

## 2024-05-01 DIAGNOSIS — R519 Headache, unspecified: Secondary | ICD-10-CM

## 2024-05-01 DIAGNOSIS — I10 Essential (primary) hypertension: Secondary | ICD-10-CM | POA: Diagnosis not present

## 2024-05-01 DIAGNOSIS — Z79899 Other long term (current) drug therapy: Secondary | ICD-10-CM | POA: Diagnosis not present

## 2024-05-01 DIAGNOSIS — Z7982 Long term (current) use of aspirin: Secondary | ICD-10-CM | POA: Diagnosis not present

## 2024-05-01 LAB — CBC
HCT: 38.8 % (ref 36.0–46.0)
Hemoglobin: 12.8 g/dL (ref 12.0–15.0)
MCH: 31.4 pg (ref 26.0–34.0)
MCHC: 33 g/dL (ref 30.0–36.0)
MCV: 95.3 fL (ref 80.0–100.0)
Platelets: 257 K/uL (ref 150–400)
RBC: 4.07 MIL/uL (ref 3.87–5.11)
RDW: 12.1 % (ref 11.5–15.5)
WBC: 8.9 K/uL (ref 4.0–10.5)
nRBC: 0 % (ref 0.0–0.2)

## 2024-05-01 LAB — BASIC METABOLIC PANEL WITH GFR
Anion gap: 11 (ref 5–15)
BUN: 24 mg/dL — ABNORMAL HIGH (ref 8–23)
CO2: 24 mmol/L (ref 22–32)
Calcium: 9.5 mg/dL (ref 8.9–10.3)
Chloride: 100 mmol/L (ref 98–111)
Creatinine, Ser: 0.95 mg/dL (ref 0.44–1.00)
GFR, Estimated: >60 mL/min (ref >60)
Glucose, Bld: 93 mg/dL (ref 70–99)
Potassium: 4.7 mmol/L (ref 3.5–5.1)
Sodium: 135 mmol/L (ref 135–145)

## 2024-05-01 MED ORDER — SODIUM CHLORIDE 0.9 % IV BOLUS
500.0000 mL | Freq: Once | INTRAVENOUS | Status: AC
  Administered 2024-05-01: 22:00:00 500 mL via INTRAVENOUS

## 2024-05-01 MED ORDER — IOHEXOL 350 MG/ML SOLN
75.0000 mL | Freq: Once | INTRAVENOUS | Status: AC | PRN
  Administered 2024-05-01: 22:00:00 75 mL via INTRAVENOUS

## 2024-05-01 NOTE — ED Provider Notes (Signed)
 [facility]    CSN: [id number] Arrival date & time: 05/01/24  1327      History   Chief Complaint Chief Complaint  Patient presents with   Headache    HPI Kayla Deleon is a 73 y.o. female.   Patient presents to clinic over concern of right sided temporal headache that has been intermittent since Saturday, 3 days ago.  When the headache starts it is severe, reports 10 out of 10 pain and has never had a headache like this.  It would last for 20 or 30 seconds or so and then remit.  Earlier today she had the headache during a workout class.  They went out to eat afterwards and she had the headache during this.  Stop by [facility] but this had a long wait so she decided to come to urgent care.  Was under the impression that urgent care could perform MRIs.  Has not had any vision changes, nausea, vomiting, photophobia or phonophobia with the headache.  She did try Tylenol  yesterday as well as an allergy pill and this did not help.  Denies recent illness.  Has not had cough, congestion, fever or neck pain.  Denies weakness, slurred speech or hx of stroke.   Started to develop a headache during examination.  The history is provided by the patient, the spouse and medical records.  Headache   Past Medical History:  Diagnosis Date   Anxiety    Cervical spondylosis    evaluated in the past by neurossurgeon   Depression    Fibroids    in the past    Gallstones    GERD (gastroesophageal reflux disease)    Hypercholesteremia    Hypertension    Mental disorder    Sleep apnea    on CPAP    Patient Active Problem List   Diagnosis Date Noted   Hyperlipidemia 12/06/2021   Accelerated hypertension 12/04/2021   Agatston CAC score 100-199 12/04/2021   Takotsubo cardiomyopathy  =>  (Initial Dx was Possible Anterior STEMI - Changed) 12/03/2021   Hyponatremia 12/03/2021   Delirium due to another medical condition 12/03/2021   Chest pain 12/02/2021   Presbycusis of  both ears 10/31/2019   Tinnitus of both ears 10/31/2019   Idiopathic small fiber peripheral neuropathy 04/18/2017   Well adult exam 02/15/2017   Cough 07/04/2015   Generalized anxiety disorder 08/25/2011   Major depressive disorder, recurrent (HCC) 08/21/2011    Past Surgical History:  Procedure Laterality Date   CESAREAN SECTION     CHOLECYSTECTOMY     LEFT HEART CATH AND CORONARY ANGIOGRAPHY N/A 12/03/2021   Procedure: LEFT HEART CATH AND CORONARY ANGIOGRAPHY;  Surgeon: Wonda Sharper, MD;  Location: [facility];  Service: Cardiovascular;  Laterality: N/A;    OB History   No obstetric history on file.      Home Medications    Prior to Admission medications  Medication Sig Start Date End Date Taking? Authorizing Provider  aspirin  EC ([facility] ASPIRIN  LOW DOSE) 81 MG tablet TAKE 1 TABLET (81 MG TOTAL) BY MOUTH DAILY. SWALLOW WHOLE. 04/21/23  Yes Janene, Hao, PA  buPROPion  (WELLBUTRIN  SR) 200 MG 12 hr tablet Take 200 mg by mouth daily. 10/19/15  Yes [provider]  carvedilol  (COREG ) 6.25 MG tablet TAKE 1 TABLET BY MOUTH TWICE A DAY WITH FOOD 11/16/23  Yes Hilty, Vinie BROCKS, MD  escitalopram  (LEXAPRO ) 20 MG tablet Take 1 tablet (20 mg total) by mouth daily. For depression and  anxiety. 08/28/11  Yes Glendia Rollene LABOR, FNP  Evolocumab  (REPATHA  SURECLICK) 140 MG/ML SOAJ Inject 140 mg into the skin every 14 (fourteen) days. 05/28/23  Yes Monge, Damien BROCKS, NP  ezetimibe  (ZETIA ) 10 MG tablet TAKE 1 TABLET BY MOUTH EVERY DAY 04/20/23  Yes Hilty, Vinie BROCKS, MD  isosorbide  mononitrate (IMDUR ) 60 MG 24 hr tablet TAKE 1 TABLET BY MOUTH EVERY DAY 08/16/23  Yes Hilty, Vinie BROCKS, MD  lamoTRIgine  (LAMICTAL ) 25 MG tablet Take 50 mg by mouth daily.   Yes [provider]  losartan  (COZAAR ) 50 MG tablet TAKE 1 TABLET BY MOUTH EVERYDAY AT BEDTIME 11/16/23  Yes Hilty, Vinie BROCKS, MD  Multiple Vitamin (MULTIVITAMIN) tablet Take 1 tablet by mouth daily.   Yes [provider]  Omega-3 Fatty  Acids (FISH OIL PO) Take 2 capsules by mouth daily.   Yes [provider]  spironolactone  (ALDACTONE ) 25 MG tablet TAKE 1 TABLET (25 MG TOTAL) BY MOUTH DAILY. 11/16/23  Yes Hilty, Vinie BROCKS, MD  augmented betamethasone  dipropionate (DIPROLENE -AF) 0.05 % cream Apply 1 Application topically 2 (two) times daily as needed for rash. 11/17/21   [provider]  dimenhyDRINATE (DRAMAMINE PO) Take 1 tablet by mouth daily.    [provider]  EPINEPHrine 0.3 mg/0.3 mL IJ SOAJ injection Inject 0.3 mg into the muscle daily as needed for anaphylaxis.    [provider]  famotidine (PEPCID) 40 MG tablet Take 40 mg by mouth as needed for heartburn or indigestion.    [provider]  nitroGLYCERIN  (NITROSTAT ) 0.4 MG SL tablet Place 1 tablet (0.4 mg total) under the tongue every 5 (five) minutes as needed for chest pain (up to 3 doses. If taking 3rd dose call 911). 04/19/23   Hilty, Vinie BROCKS, MD  omeprazole  (PRILOSEC OTC) 20 MG tablet Take 20 mg by mouth daily. Pt taking as needed    [provider]    Family History Family History  Problem Relation Age of Onset   Other Mother        brain aneurysm   Heart attack Father    Hypertension Father    Hyperlipidemia Father    Hyperlipidemia Brother    Gout Brother    Heart disease Brother    Hyperlipidemia Daughter    Depression Daughter    Neuropathy Neg Hx    Esophageal cancer Neg Hx    Colon cancer Neg Hx    Rectal cancer Neg Hx    Stomach cancer Neg Hx     Social History Social History[1]   Allergies   Shellfish allergy, Fentanyl , Metronidazole hcl, Percocet [oxycodone-acetaminophen ], and Statins   Review of Systems Review of Systems  Per HPI  Physical Exam Triage Vital Signs ED Triage Vitals  Encounter Vitals Group     BP 05/01/24 1513 116/68     Girls Systolic BP Percentile --      Girls Diastolic BP Percentile --      Boys Systolic BP Percentile --      Boys Diastolic BP Percentile  --      Pulse Rate 05/01/24 1513 69     Resp 05/01/24 1513 18     Temp 05/01/24 1513 (!) 97.3 F (36.3 C)     Temp src --      SpO2 05/01/24 1513 93 %     Weight --      Height --      Head Circumference --      Peak Flow --  Pain Score 05/01/24 1510 1     Pain Loc --      Pain Education --      Exclude from Growth Chart --    No data found.  Updated Vital Signs BP 116/68   Pulse 69   Temp (!) 97.3 F (36.3 C)   Resp 18   SpO2 93%   Visual Acuity Right Eye Distance:   Left Eye Distance:   Bilateral Distance:    Right Eye Near:   Left Eye Near:    Bilateral Near:     Physical Exam Vitals and nursing note reviewed.  Constitutional:      Appearance: Normal appearance. She is well-developed.  HENT:     Head: Normocephalic and atraumatic.     Right Ear: External ear normal.     Left Ear: External ear normal.     Nose: Nose normal.     Mouth/Throat:     Mouth: Mucous membranes are moist.  Eyes:     Extraocular Movements: Extraocular movements intact.     Conjunctiva/sclera: Conjunctivae normal.     Pupils: Pupils are equal, round, and reactive to light.  Cardiovascular:     Rate and Rhythm: Normal rate.  Pulmonary:     Effort: Pulmonary effort is normal. No respiratory distress.  Skin:    General: Skin is warm and dry.  Neurological:     General: No focal deficit present.     Mental Status: She is alert and oriented to person, place, and time.     GCS: GCS eye subscore is 4. GCS verbal subscore is 5. GCS motor subscore is 6.     Cranial Nerves: No cranial nerve deficit.     Motor: Motor function is intact.     Gait: Gait is intact.  Psychiatric:        Mood and Affect: Mood normal.      UC Treatments / Results  Labs (all labs ordered are listed, but only abnormal results are displayed) Labs Reviewed - No data to display  EKG   Radiology No results found.  Procedures Procedures (including critical care time)  Medications Ordered in  UC Medications - No data to display  Initial Impression / Assessment and Plan / UC Course  I have reviewed the triage vital signs and the nursing notes.  Pertinent labs & imaging results that were available during my care of the patient were reviewed by me and considered in my medical decision making (see chart for details).  Vitals and triage reviewed, patient is hemodynamically stable.  Cranial nerves II through XII grossly intact.  Strength 5 out of 5 in bilateral upper extremities.  Gait intact.  GCS 15.  PERRLA. EOMI.   Discussed limitations of headache evaluation within urgent care as well as possible etiologies and inability to order MRI.  Patient verbalized understanding and will seek further care at local emergency department Transport self via POV. Stable for discharge.    Final Clinical Impressions(s) / UC Diagnoses   Final diagnoses:  Bad headache   Discharge Instructions   None    ED Prescriptions   None    PDMP not reviewed this encounter.    [1]  Social History Tobacco Use   Smoking status: Former    Current packs/day: 0.00    Average packs/day: 0.1 packs/day for 1 year (0.1 ttl pk-yrs)    Types: Cigarettes    Start date: 05/18/1974    Quit date: 05/19/1975    Years since  quitting: 48.9   Smokeless tobacco: Never   Tobacco comments:    Smoked in college- occasional/social   Vaping Use   Vaping status: Never Used  Substance Use Topics   Alcohol use: Yes    Alcohol/week: 0.0 standard drinks of alcohol    Comment: social; 4 beers a month max   Drug use: No     Dreama Augusto SAILOR, FNP 05/01/24 1551

## 2024-05-01 NOTE — ED Notes (Signed)
 Pt refused vital recheck at this time.

## 2024-05-01 NOTE — Discharge Instructions (Addendum)
Make an appointment to have close follow-up with your primary care doctor.  Return to the emergency room if you have any worsening symptoms. 

## 2024-05-01 NOTE — ED Triage Notes (Signed)
 Patient c/o headache x 3 days. Patient report taking tylenol  with no relief. Patient denies dizziness and blurred vision. Patient denies N/V.

## 2024-05-01 NOTE — ED Provider Triage Note (Signed)
 Emergency Medicine Provider Triage Evaluation Note  Kayla Deleon , a 73 y.o. female  was evaluated in triage.  Pt complains of a severe headache.  Pt reports her Mother had a brain anyerism  Review of Systems  Positive: headache Negative: Fever   Physical Exam  BP (!) 147/78   Pulse 72   Temp 98 F (36.7 C)   Resp 16   SpO2 100%  Gen:   Awake, no distress   Resp:  Normal effort  MSK:   Moves extremities without difficulty  Other:    Medical Decision Making  Medically screening exam initiated at 4:25 PM.  Appropriate orders placed.  Shariyah Anzleigh Slaven was informed that the remainder of the evaluation will be completed by another provider, this initial triage assessment does not replace that evaluation, and the importance of remaining in the ED until their evaluation is complete.     Flint Sonny POUR, PA-C 05/01/24 1627

## 2024-05-01 NOTE — ED Provider Notes (Signed)
 [place] EMERGENCY DEPARTMENT AT [facility] Provider Note   CSN: [id number] Arrival date & time: 05/01/24  1558     Patient presents with: Headache   Kayla Deleon is a 73 y.o. female.   Patient is a 73 year old female who presents with a headache.  She said it started 2 days ago.  She says its sharp stabbing type pains that come on suddenly and her right scalp area.  It lasts about a minute and then goes away it has been pretty steady coming and going over the last 2 days.  Although currently she says it subsided and she does not report any headache.  She does not have any nausea or vomiting.  No associated neck pain other than some baseline neck stiffness that she always has.  No fevers.  No recent head trauma.  No vision changes.  No increased pain with chewing.  No history of similar headaches in the past.  Her mom had an aneurysm at the age of 79 and she is concerned about that.       Prior to Admission medications  Medication Sig Start Date End Date Taking? Authorizing Provider  aspirin  EC ([employer] ASPIRIN  LOW DOSE) 81 MG tablet TAKE 1 TABLET (81 MG TOTAL) BY MOUTH DAILY. SWALLOW WHOLE. 04/21/23   Meng, Hao, PA  augmented betamethasone  dipropionate (DIPROLENE -AF) 0.05 % cream Apply 1 Application topically 2 (two) times daily as needed for rash. 11/17/21   [provider]  buPROPion  (WELLBUTRIN  SR) 200 MG 12 hr tablet Take 200 mg by mouth daily. 10/19/15   [provider]  carvedilol  (COREG ) 6.25 MG tablet TAKE 1 TABLET BY MOUTH TWICE A DAY WITH FOOD 11/16/23   Hilty, Vinie BROCKS, MD  dimenhyDRINATE (DRAMAMINE PO) Take 1 tablet by mouth daily.    [provider]  EPINEPHrine 0.3 mg/0.3 mL IJ SOAJ injection Inject 0.3 mg into the muscle daily as needed for anaphylaxis.    [provider]  escitalopram  (LEXAPRO ) 20 MG tablet Take 1 tablet (20 mg total) by mouth daily. For depression and anxiety. 08/28/11   Glendia Rollene LABOR, FNP  Evolocumab   (REPATHA  SURECLICK) 140 MG/ML SOAJ Inject 140 mg into the skin every 14 (fourteen) days. 05/28/23   Daneen Damien BROCKS, NP  ezetimibe  (ZETIA ) 10 MG tablet TAKE 1 TABLET BY MOUTH EVERY DAY 04/20/23   Hilty, Vinie BROCKS, MD  famotidine (PEPCID) 40 MG tablet Take 40 mg by mouth as needed for heartburn or indigestion.    [provider]  isosorbide  mononitrate (IMDUR ) 60 MG 24 hr tablet TAKE 1 TABLET BY MOUTH EVERY DAY 08/16/23   Hilty, Vinie BROCKS, MD  lamoTRIgine  (LAMICTAL ) 25 MG tablet Take 50 mg by mouth daily.    [provider]  losartan  (COZAAR ) 50 MG tablet TAKE 1 TABLET BY MOUTH EVERYDAY AT BEDTIME 11/16/23   Hilty, Vinie BROCKS, MD  Multiple Vitamin (MULTIVITAMIN) tablet Take 1 tablet by mouth daily.    [provider]  nitroGLYCERIN  (NITROSTAT ) 0.4 MG SL tablet Place 1 tablet (0.4 mg total) under the tongue every 5 (five) minutes as needed for chest pain (up to 3 doses. If taking 3rd dose call 911). 04/19/23   Hilty, Vinie BROCKS, MD  Omega-3 Fatty Acids (FISH OIL PO) Take 2 capsules by mouth daily.    [provider]  omeprazole  (PRILOSEC OTC) 20 MG tablet Take 20 mg by mouth daily. Pt taking as needed    [provider]  spironolactone  (ALDACTONE ) 25  MG tablet TAKE 1 TABLET (25 MG TOTAL) BY MOUTH DAILY. 11/16/23   Hilty, Vinie BROCKS, MD    Allergies: Shellfish allergy, Fentanyl , Metronidazole hcl, Percocet [oxycodone-acetaminophen ], and Statins    Review of Systems  Constitutional:  Negative for chills, diaphoresis, fatigue and fever.  HENT:  Negative for congestion, rhinorrhea and sneezing.   Eyes: Negative.   Respiratory:  Negative for cough, chest tightness and shortness of breath.   Cardiovascular:  Negative for chest pain and leg swelling.  Gastrointestinal:  Negative for abdominal pain, diarrhea, nausea and vomiting.  Genitourinary:  Negative for difficulty urinating, flank pain and frequency.  Musculoskeletal:  Negative for arthralgias, back pain and neck  pain.  Skin:  Negative for rash.  Neurological:  Positive for headaches. Negative for dizziness, speech difficulty, weakness and numbness.    Updated Vital Signs BP (!) 145/64 (BP Location: Right Arm)   Pulse 69   Temp 98.2 F (36.8 C) (Oral)   Resp 16   SpO2 98%   Physical Exam Constitutional:      Appearance: She is well-developed.  HENT:     Head: Normocephalic and atraumatic.     Comments: No pain over the temporal artery Eyes:     Pupils: Pupils are equal, round, and reactive to light.  Neck:     Comments: No meningismus Cardiovascular:     Rate and Rhythm: Normal rate and regular rhythm.     Heart sounds: Normal heart sounds.  Pulmonary:     Effort: Pulmonary effort is normal. No respiratory distress.     Breath sounds: Normal breath sounds. No wheezing or rales.  Chest:     Chest wall: No tenderness.  Abdominal:     General: Bowel sounds are normal.     Palpations: Abdomen is soft.     Tenderness: There is no abdominal tenderness. There is no guarding or rebound.  Musculoskeletal:        General: Normal range of motion.     Cervical back: Normal range of motion and neck supple.  Lymphadenopathy:     Cervical: No cervical adenopathy.  Skin:    General: Skin is warm and dry.     Findings: No rash.  Neurological:     Mental Status: She is alert and oriented to person, place, and time.     Comments: Motor 5/5 all extremities Sensation grossly intact to LT all extremities Finger to Nose intact, no pronator drift CN II-XII grossly intact Gait normal      (all labs ordered are listed, but only abnormal results are displayed) Labs Reviewed  BASIC METABOLIC PANEL WITH GFR - Abnormal; Notable for the following components:      Result Value   BUN 24 (*)    All other components within normal limits  CBC    EKG: None  Radiology: CT Angio Head W or Wo Contrast Result Date: 05/01/2024 EXAM: CTA Head without and with Intravenous Contrast CLINICAL HISTORY:  Headache, sudden, severe TECHNIQUE: Axial CTA images of the head without and with intravenous contrast. MIP reconstructed images were created and reviewed. Dose reduction technique was used including one or more of the following: automated exposure control, adjustment of mA and kV according to patient size, and/or iterative reconstruction. CONTRAST: Without and with; 75 mL iohexol  (OMNIPAQUE ) 350 MG/ML injection COMPARISON: CT from earlier the same day. FINDINGS: Noncontrast portion of this exam is stable with no acute findings such as intracranial hemorrhage or large vessel territory infarct. No mass lesion or midline  shift. No hydrosalpinx or atelectasis or fluid collection. No abnormal hyperdense vessel. No intracranial aneurysm or other vascular malformation. INTERNAL CAROTID ARTERIES: Atheromatous change about the carotid siphons without hemodynamically significant stenosis or other abnormality. The intracranial ICAs are patent with no significant stenosis. No occlusion. No aneurysm. ANTERIOR CEREBRAL ARTERIES: A1 segment is patent bilaterally. Right A1 hypoplastic. Normal anterior Circle of Willis. Anterior cerebral arteries patent without stenosis. No occlusion. No aneurysm. MIDDLE CEREBRAL ARTERIES: No M1 stenosis or occlusion. Distal branch of perfusion and swelling. No aneurysm. POSTERIOR CEREBRAL ARTERIES: The right PCA is applied to the basilar. Predominant fetal origin of the left PCA. PCA is patent to their distal aspects without significant stenosis. No occlusion. No aneurysm. BASILAR ARTERY: Basilar patent without stenosis. No occlusion. No aneurysm. VERTEBRAL ARTERIES: Both V4 segments patent without significant stenosis. Right vertebral artery dominant. Both PICA patent. No occlusion. No aneurysm. SUPERIOR CEREBELLAR ARTERIES: Patent bilaterally. SOFT TISSUES: Scalp soft tissues demonstrate no acute finding. No masses or lymphadenopathy. Globes and orbital soft tissues are within normal limits.  Paranasal sinuses and mastoid air cells are clear. BONES: Calvarium intact. Calcified atherosclerosis present about the skull base. No acute osseous abnormality. IMPRESSION: 1. Negative CTA for acute vascular abnormality. No intracranial aneurysm. 2. No other acute intracranial abnormality. 3. Calcified atherosclerosis about the skull base without hemodynamically significant stenosis. Electronically signed by: Morene Hoard MD 05/01/2024 10:27 PM EST RP Workstation: [id number]   CT Head Wo Contrast Result Date: 05/01/2024 EXAM: CT HEAD WITHOUT 05/01/2024 05:08:00 PM TECHNIQUE: CT of the head was performed without the administration of intravenous contrast. Automated exposure control, iterative reconstruction, and/or weight based adjustment of the mA/kV was utilized to reduce the radiation dose to as low as reasonably achievable. COMPARISON: None available. CLINICAL HISTORY: Headache, new onset (Age >= 51y) FINDINGS: BRAIN AND VENTRICLES: No acute intracranial hemorrhage. No mass effect or midline shift. No extra-axial fluid collection. No evidence of acute infarct. No hydrocephalus. Mild calcific atheromatous disease within carotid siphons. ORBITS: No acute abnormality. SINUSES AND MASTOIDS: No acute abnormality. SOFT TISSUES AND SKULL: No acute skull fracture. No acute soft tissue abnormality. Severe right TMJ degenerative change. IMPRESSION: 1. No acute intracranial abnormality. 2. Severe right temporomandibular joint degenerative change. Electronically signed by: Morene Hoard MD 05/01/2024 05:21 PM EST RP Workstation: [id number]     Procedures   Medications Ordered in the ED  sodium chloride  0.9 % bolus 500 mL (500 mLs Intravenous New Bag/Given 05/01/24 2211)  iohexol  (OMNIPAQUE ) 350 MG/ML injection 75 mL (75 mLs Intravenous Contrast Given 05/01/24 2148)                                    Medical Decision Making Amount and/or Complexity of Data Reviewed Labs: ordered. Radiology:  ordered.  Risk Prescription drug management.   This patient presents to the ED for concern of headache, this involves an extensive number of treatment options, and is a complaint that carries with it a high risk of complications and morbidity.  I considered the following differential and admission for this acute, potentially life threatening condition.  The differential diagnosis includes tension headache, neuralgia, shingles, sinusitis, subarachnoid hemorrhage, meningitis, subdural hematoma, brain mass  MDM:    Patient is 73 year old who presents with a headache that started 2 days ago.  No neurologic deficits.  No vision changes.  No fever or other infectious symptoms.  No neck pain or other symptoms that would be  more concerning for subarachnoid hemorrhage or meningitis.  She does not have pain over the temporal artery which would be more concerning for temporal arteritis.  She does not have any neurologic deficits or other clinical concerns for stroke.  She is otherwise well-appearing and actually has been headache free since about 3:00 this afternoon.  Head CT does not show any acute abnormality.  CTA of the head does not show any aneurysm or other vascular abnormality.  She is currently headache free.  She was discharged home in good condition.  She was encouraged to have close follow-up with her PCP.  Return precautions were given.  (Labs, imaging, consults)  Labs: I Ordered, and personally interpreted labs.  The pertinent results include: Normal creatinine, normal white count  Imaging Studies ordered: I ordered imaging studies including CT head, CT angio head I independently visualized and interpreted imaging. I agree with the radiologist interpretation  Additional history obtained from  .  External records from outside source obtained and reviewed including    Cardiac Monitoring: The patient was not maintained on a cardiac monitor.  If on the cardiac monitor, I personally viewed and  interpreted the cardiac monitored which showed an underlying rhythm of:    Reevaluation: After the interventions noted above, I reevaluated the patient and found that they have :improved  Social Determinants of Health:    Disposition: Discharged to home  Co morbidities that complicate the patient evaluation  Past Medical History:  Diagnosis Date   Anxiety    Cervical spondylosis    evaluated in the past by neurossurgeon   Depression    Fibroids    in the past    Gallstones    GERD (gastroesophageal reflux disease)    Hypercholesteremia    Hypertension    Mental disorder    Sleep apnea    on CPAP     Medicines Meds ordered this encounter  Medications   sodium chloride  0.9 % bolus 500 mL   iohexol  (OMNIPAQUE ) 350 MG/ML injection 75 mL    I have reviewed the patients home medicines and have made adjustments as needed  Problem List / ED Course: Problem List Items Addressed This Visit   None Visit Diagnoses       Acute nonintractable headache, unspecified headache type    -  Primary                Final diagnoses:  Acute nonintractable headache, unspecified headache type    ED Discharge Orders     None          Lenor Hollering, MD 05/01/24 2243

## 2024-05-01 NOTE — ED Triage Notes (Signed)
 PT has a Rt sided HA since SAT. Pt has been taking Tylenol  with out relief. Pt reports HA has been constant since SAT morning. Pt reports HA stopped at 12noon today but is starting again., No changes in vision.

## 2024-05-05 ENCOUNTER — Other Ambulatory Visit: Payer: Self-pay | Admitting: Family Medicine

## 2024-05-05 DIAGNOSIS — R928 Other abnormal and inconclusive findings on diagnostic imaging of breast: Secondary | ICD-10-CM

## 2024-05-08 ENCOUNTER — Other Ambulatory Visit: Payer: Self-pay | Admitting: Internal Medicine

## 2024-05-22 ENCOUNTER — Encounter

## 2024-05-22 ENCOUNTER — Other Ambulatory Visit

## 2024-06-03 ENCOUNTER — Ambulatory Visit

## 2024-06-03 ENCOUNTER — Ambulatory Visit
Admission: RE | Admit: 2024-06-03 | Discharge: 2024-06-03 | Disposition: A | Discharge status: Home / Self Care | Source: Ambulatory Visit | Attending: Family Medicine | Admitting: Family Medicine

## 2024-06-03 DIAGNOSIS — R928 Other abnormal and inconclusive findings on diagnostic imaging of breast: Secondary | ICD-10-CM

## 2024-06-13 ENCOUNTER — Encounter

## 2024-06-13 ENCOUNTER — Other Ambulatory Visit
